# Patient Record
Sex: Male | Born: 1950 | Race: White | Hispanic: No | Marital: Married | Smoking: Former smoker
Health system: Southern US, Community
[De-identification: ages and names within clinical notes are randomized; demographics above are authoritative.]

## PROBLEM LIST (undated history)

## (undated) DIAGNOSIS — Z860101 Personal history of adenomatous and serrated colon polyps: Secondary | ICD-10-CM

## (undated) DIAGNOSIS — I10 Essential (primary) hypertension: Secondary | ICD-10-CM

## (undated) DIAGNOSIS — K219 Gastro-esophageal reflux disease without esophagitis: Secondary | ICD-10-CM

## (undated) DIAGNOSIS — K819 Cholecystitis, unspecified: Secondary | ICD-10-CM

## (undated) DIAGNOSIS — I2109 ST elevation (STEMI) myocardial infarction involving other coronary artery of anterior wall: Secondary | ICD-10-CM

## (undated) DIAGNOSIS — Z95810 Presence of automatic (implantable) cardiac defibrillator: Secondary | ICD-10-CM

## (undated) DIAGNOSIS — E785 Hyperlipidemia, unspecified: Secondary | ICD-10-CM

## (undated) DIAGNOSIS — I2510 Atherosclerotic heart disease of native coronary artery without angina pectoris: Secondary | ICD-10-CM

## (undated) DIAGNOSIS — E119 Type 2 diabetes mellitus without complications: Secondary | ICD-10-CM

## (undated) DIAGNOSIS — I509 Heart failure, unspecified: Secondary | ICD-10-CM

## (undated) DIAGNOSIS — Z86010 Personal history of colonic polyps: Secondary | ICD-10-CM

## (undated) HISTORY — PX: COLONOSCOPY: SHX174

## (undated) HISTORY — DX: Atherosclerotic heart disease of native coronary artery without angina pectoris: I25.10

## (undated) HISTORY — DX: Gastro-esophageal reflux disease without esophagitis: K21.9

## (undated) HISTORY — DX: Essential (primary) hypertension: I10

## (undated) HISTORY — DX: Personal history of adenomatous and serrated colon polyps: Z86.0101

## (undated) HISTORY — DX: Personal history of colonic polyps: Z86.010

## (undated) HISTORY — PX: COLONOSCOPY W/ BIOPSIES: SHX1374

## (undated) HISTORY — DX: Cholecystitis, unspecified: K81.9

---

## 1988-12-30 HISTORY — PX: CLOSED REDUCTION HAND FRACTURE: SHX973

## 2002-12-30 HISTORY — PX: CORONARY ANGIOPLASTY WITH STENT PLACEMENT: SHX49

## 2003-03-12 ENCOUNTER — Encounter: Payer: Self-pay | Admitting: Emergency Medicine

## 2003-03-12 ENCOUNTER — Inpatient Hospital Stay (HOSPITAL_COMMUNITY): Admission: EM | Admit: 2003-03-12 | Discharge: 2003-03-16 | Payer: Self-pay | Admitting: Emergency Medicine

## 2003-04-07 ENCOUNTER — Encounter: Payer: Self-pay | Admitting: Cardiology

## 2003-04-07 ENCOUNTER — Inpatient Hospital Stay (HOSPITAL_COMMUNITY): Admission: EM | Admit: 2003-04-07 | Discharge: 2003-04-11 | Payer: Self-pay

## 2004-11-26 ENCOUNTER — Ambulatory Visit: Payer: Self-pay | Admitting: Family Medicine

## 2004-11-30 ENCOUNTER — Ambulatory Visit: Payer: Self-pay | Admitting: Cardiology

## 2004-11-30 ENCOUNTER — Observation Stay (HOSPITAL_COMMUNITY): Admission: EM | Admit: 2004-11-30 | Discharge: 2004-12-01 | Payer: Self-pay | Admitting: Emergency Medicine

## 2004-12-03 ENCOUNTER — Ambulatory Visit: Payer: Self-pay | Admitting: *Deleted

## 2005-01-02 ENCOUNTER — Ambulatory Visit: Payer: Self-pay | Admitting: Family Medicine

## 2005-01-03 ENCOUNTER — Ambulatory Visit: Payer: Self-pay | Admitting: Licensed Clinical Social Worker

## 2005-01-09 ENCOUNTER — Ambulatory Visit: Payer: Self-pay | Admitting: Licensed Clinical Social Worker

## 2005-01-16 ENCOUNTER — Ambulatory Visit: Payer: Self-pay | Admitting: Licensed Clinical Social Worker

## 2005-01-23 ENCOUNTER — Ambulatory Visit: Payer: Self-pay | Admitting: Family Medicine

## 2005-01-30 ENCOUNTER — Ambulatory Visit: Payer: Self-pay | Admitting: Licensed Clinical Social Worker

## 2005-01-31 ENCOUNTER — Ambulatory Visit: Payer: Self-pay | Admitting: Family Medicine

## 2005-01-31 ENCOUNTER — Encounter: Payer: Self-pay | Admitting: Family Medicine

## 2005-03-06 ENCOUNTER — Ambulatory Visit: Payer: Self-pay | Admitting: *Deleted

## 2005-03-08 ENCOUNTER — Ambulatory Visit: Payer: Self-pay | Admitting: Licensed Clinical Social Worker

## 2005-03-22 ENCOUNTER — Ambulatory Visit: Payer: Self-pay | Admitting: Family Medicine

## 2005-04-04 ENCOUNTER — Ambulatory Visit: Payer: Self-pay | Admitting: Family Medicine

## 2005-04-22 ENCOUNTER — Ambulatory Visit: Payer: Self-pay | Admitting: Internal Medicine

## 2005-05-08 ENCOUNTER — Ambulatory Visit: Payer: Self-pay | Admitting: Family Medicine

## 2005-06-05 ENCOUNTER — Ambulatory Visit: Payer: Self-pay | Admitting: Internal Medicine

## 2005-06-18 ENCOUNTER — Ambulatory Visit: Payer: Self-pay | Admitting: Internal Medicine

## 2005-06-26 ENCOUNTER — Encounter (INDEPENDENT_AMBULATORY_CARE_PROVIDER_SITE_OTHER): Payer: Self-pay | Admitting: *Deleted

## 2005-06-26 ENCOUNTER — Ambulatory Visit: Payer: Self-pay | Admitting: Internal Medicine

## 2005-07-08 ENCOUNTER — Ambulatory Visit: Payer: Self-pay | Admitting: Family Medicine

## 2005-07-15 ENCOUNTER — Ambulatory Visit: Payer: Self-pay | Admitting: Internal Medicine

## 2005-08-29 ENCOUNTER — Ambulatory Visit: Payer: Self-pay | Admitting: Internal Medicine

## 2005-11-05 ENCOUNTER — Ambulatory Visit: Payer: Self-pay | Admitting: Cardiology

## 2005-11-29 ENCOUNTER — Ambulatory Visit: Payer: Self-pay | Admitting: Family Medicine

## 2005-12-04 ENCOUNTER — Ambulatory Visit: Payer: Self-pay | Admitting: Internal Medicine

## 2006-01-16 ENCOUNTER — Ambulatory Visit: Payer: Self-pay | Admitting: Family Medicine

## 2006-02-27 ENCOUNTER — Ambulatory Visit: Payer: Self-pay | Admitting: Family Medicine

## 2006-03-05 ENCOUNTER — Ambulatory Visit: Payer: Self-pay | Admitting: Internal Medicine

## 2006-03-26 ENCOUNTER — Ambulatory Visit: Payer: Self-pay | Admitting: Family Medicine

## 2006-07-26 ENCOUNTER — Observation Stay (HOSPITAL_COMMUNITY): Admission: EM | Admit: 2006-07-26 | Discharge: 2006-07-27 | Payer: Self-pay | Admitting: Emergency Medicine

## 2006-07-26 ENCOUNTER — Ambulatory Visit: Payer: Self-pay | Admitting: Internal Medicine

## 2006-07-29 ENCOUNTER — Ambulatory Visit: Payer: Self-pay | Admitting: Internal Medicine

## 2006-08-14 ENCOUNTER — Ambulatory Visit (HOSPITAL_COMMUNITY): Admission: RE | Admit: 2006-08-14 | Discharge: 2006-08-14 | Payer: Self-pay | Admitting: Cardiovascular Disease

## 2006-10-01 ENCOUNTER — Ambulatory Visit: Payer: Self-pay | Admitting: Internal Medicine

## 2007-01-26 ENCOUNTER — Ambulatory Visit: Payer: Self-pay | Admitting: Family Medicine

## 2007-01-26 LAB — CONVERTED CEMR LAB
Hgb A1c MFr Bld: 6.4 %
Hgb A1c MFr Bld: 6.4 % — ABNORMAL HIGH (ref 4.6–6.0)

## 2007-01-30 ENCOUNTER — Ambulatory Visit: Payer: Self-pay | Admitting: Internal Medicine

## 2007-02-04 ENCOUNTER — Ambulatory Visit: Payer: Self-pay

## 2007-03-31 ENCOUNTER — Ambulatory Visit: Payer: Self-pay | Admitting: Family Medicine

## 2007-04-03 ENCOUNTER — Ambulatory Visit: Payer: Self-pay | Admitting: Internal Medicine

## 2007-08-28 ENCOUNTER — Ambulatory Visit: Payer: Self-pay | Admitting: Internal Medicine

## 2007-09-07 ENCOUNTER — Ambulatory Visit: Payer: Self-pay | Admitting: Internal Medicine

## 2007-12-04 ENCOUNTER — Telehealth (INDEPENDENT_AMBULATORY_CARE_PROVIDER_SITE_OTHER): Payer: Self-pay | Admitting: *Deleted

## 2008-02-03 ENCOUNTER — Ambulatory Visit: Payer: Self-pay | Admitting: Family Medicine

## 2008-02-03 DIAGNOSIS — K5289 Other specified noninfective gastroenteritis and colitis: Secondary | ICD-10-CM

## 2008-02-09 ENCOUNTER — Encounter: Payer: Self-pay | Admitting: Family Medicine

## 2008-02-09 DIAGNOSIS — Z87891 Personal history of nicotine dependence: Secondary | ICD-10-CM

## 2008-02-09 DIAGNOSIS — I252 Old myocardial infarction: Secondary | ICD-10-CM

## 2008-02-09 DIAGNOSIS — K219 Gastro-esophageal reflux disease without esophagitis: Secondary | ICD-10-CM

## 2008-02-09 DIAGNOSIS — I2510 Atherosclerotic heart disease of native coronary artery without angina pectoris: Secondary | ICD-10-CM | POA: Insufficient documentation

## 2008-02-09 DIAGNOSIS — G4700 Insomnia, unspecified: Secondary | ICD-10-CM

## 2008-02-09 DIAGNOSIS — E785 Hyperlipidemia, unspecified: Secondary | ICD-10-CM

## 2008-02-10 ENCOUNTER — Ambulatory Visit: Payer: Self-pay | Admitting: Family Medicine

## 2008-02-11 DIAGNOSIS — E119 Type 2 diabetes mellitus without complications: Secondary | ICD-10-CM | POA: Insufficient documentation

## 2008-02-26 ENCOUNTER — Encounter: Admission: RE | Admit: 2008-02-26 | Discharge: 2008-02-26 | Payer: Self-pay | Admitting: Family Medicine

## 2008-02-26 ENCOUNTER — Encounter: Payer: Self-pay | Admitting: Family Medicine

## 2008-06-10 ENCOUNTER — Telehealth: Payer: Self-pay | Admitting: Family Medicine

## 2008-07-07 ENCOUNTER — Ambulatory Visit: Payer: Self-pay | Admitting: Family Medicine

## 2008-07-25 ENCOUNTER — Ambulatory Visit: Payer: Self-pay | Admitting: Family Medicine

## 2008-07-26 LAB — CONVERTED CEMR LAB
ALT: 21 units/L (ref 0–53)
AST: 24 units/L (ref 0–37)
Albumin: 3.9 g/dL (ref 3.5–5.2)
Alkaline Phosphatase: 47 units/L (ref 39–117)
BUN: 11 mg/dL (ref 6–23)
Bilirubin, Direct: 0.1 mg/dL (ref 0.0–0.3)
CO2: 31 meq/L (ref 19–32)
Calcium: 9.1 mg/dL (ref 8.4–10.5)
Chloride: 105 meq/L (ref 96–112)
Cholesterol: 100 mg/dL (ref 0–200)
Creatinine, Ser: 0.9 mg/dL (ref 0.4–1.5)
GFR calc Af Amer: 112 mL/min
GFR calc non Af Amer: 93 mL/min
Glucose, Bld: 145 mg/dL — ABNORMAL HIGH (ref 70–99)
HDL: 31.4 mg/dL — ABNORMAL LOW (ref >39.0)
Hgb A1c MFr Bld: 6.1 % — ABNORMAL HIGH (ref 4.6–6.0)
LDL Cholesterol: 60 mg/dL (ref 0–99)
Phosphorus: 2.2 mg/dL — ABNORMAL LOW (ref 2.3–4.6)
Potassium: 4.8 meq/L (ref 3.5–5.1)
Sodium: 141 meq/L (ref 135–145)
Total Bilirubin: 0.8 mg/dL (ref 0.3–1.2)
Total CHOL/HDL Ratio: 3.2
Total Protein: 6.6 g/dL (ref 6.0–8.3)
Triglycerides: 43 mg/dL (ref 0–149)
VLDL: 9 mg/dL (ref 0–40)

## 2008-08-11 ENCOUNTER — Ambulatory Visit: Payer: Self-pay | Admitting: Internal Medicine

## 2009-04-04 ENCOUNTER — Encounter: Payer: Self-pay | Admitting: Internal Medicine

## 2009-04-04 ENCOUNTER — Ambulatory Visit: Payer: Self-pay | Admitting: Family Medicine

## 2009-04-04 ENCOUNTER — Ambulatory Visit: Payer: Self-pay | Admitting: Internal Medicine

## 2009-04-04 LAB — CONVERTED CEMR LAB
ALT: 27 units/L (ref 0–53)
AST: 29 units/L (ref 0–37)
Alkaline Phosphatase: 52 units/L (ref 39–117)
BUN: 19 mg/dL (ref 6–23)
Basophils Absolute: 0.1 10*3/uL (ref 0.0–0.1)
Basophils Relative: 1.7 % (ref 0.0–3.0)
Calcium: 10.3 mg/dL (ref 8.4–10.5)
Chloride: 106 meq/L (ref 96–112)
Creatinine, Ser: 0.9 mg/dL (ref 0.4–1.5)
Eosinophils Relative: 2.4 % (ref 0.0–5.0)
GFR calc non Af Amer: 92.39 mL/min (ref >60)
Glucose, Bld: 108 mg/dL — ABNORMAL HIGH (ref 70–99)
HCT: 45.4 % (ref 39.0–52.0)
Hemoglobin: 15.7 g/dL (ref 13.0–17.0)
Lymphocytes Relative: 32.6 % (ref 12.0–46.0)
Lymphs Abs: 2.6 10*3/uL (ref 0.7–4.0)
MCHC: 34.6 g/dL (ref 30.0–36.0)
Monocytes Relative: 5.7 % (ref 3.0–12.0)
Neutro Abs: 4.5 10*3/uL (ref 1.4–7.7)
Neutrophils Relative %: 57.6 % (ref 43.0–77.0)
Platelets: 248 10*3/uL (ref 150.0–400.0)
Potassium: 4.1 meq/L (ref 3.5–5.1)
RBC: 4.72 M/uL (ref 4.22–5.81)
RDW: 12.3 % (ref 11.5–14.6)
Sodium: 144 meq/L (ref 135–145)
Total Bilirubin: 1.1 mg/dL (ref 0.3–1.2)
Total Protein: 6.7 g/dL (ref 6.0–8.3)
WBC: 7.9 10*3/uL (ref 4.5–10.5)

## 2009-04-07 LAB — CONVERTED CEMR LAB
Creatinine,U: 181.4 mg/dL
Hgb A1c MFr Bld: 6.1 % (ref 4.6–6.5)
Microalb Creat Ratio: 2.8 mg/g (ref 0.0–30.0)
Microalb, Ur: 0.5 mg/dL (ref 0.0–1.9)

## 2009-05-20 ENCOUNTER — Encounter: Payer: Self-pay | Admitting: Family Medicine

## 2009-05-23 ENCOUNTER — Encounter: Payer: Self-pay | Admitting: Family Medicine

## 2009-05-28 ENCOUNTER — Emergency Department (HOSPITAL_COMMUNITY): Admission: EM | Admit: 2009-05-28 | Discharge: 2009-05-28 | Payer: Self-pay | Admitting: Family Medicine

## 2009-05-31 LAB — HM DIABETES EYE EXAM: HM Diabetic Eye Exam: NORMAL

## 2009-09-01 ENCOUNTER — Telehealth (INDEPENDENT_AMBULATORY_CARE_PROVIDER_SITE_OTHER): Payer: Self-pay | Admitting: *Deleted

## 2009-09-08 ENCOUNTER — Telehealth: Payer: Self-pay | Admitting: Family Medicine

## 2009-09-11 ENCOUNTER — Encounter: Payer: Self-pay | Admitting: Family Medicine

## 2009-09-28 ENCOUNTER — Encounter: Payer: Self-pay | Admitting: Family Medicine

## 2009-09-29 ENCOUNTER — Ambulatory Visit: Payer: Self-pay | Admitting: Family Medicine

## 2009-09-29 DIAGNOSIS — R5383 Other fatigue: Secondary | ICD-10-CM

## 2009-09-29 DIAGNOSIS — R5381 Other malaise: Secondary | ICD-10-CM

## 2009-09-29 DIAGNOSIS — R079 Chest pain, unspecified: Secondary | ICD-10-CM

## 2009-09-29 LAB — CONVERTED CEMR LAB
Bilirubin Urine: NEGATIVE
Blood in Urine, dipstick: NEGATIVE
Ketones, urine, test strip: NEGATIVE
Nitrite: NEGATIVE
Protein, U semiquant: NEGATIVE
Urobilinogen, UA: 0.2
WBC Urine, dipstick: NEGATIVE
pH: 6

## 2009-10-06 ENCOUNTER — Encounter (INDEPENDENT_AMBULATORY_CARE_PROVIDER_SITE_OTHER): Payer: Self-pay | Admitting: *Deleted

## 2009-10-09 LAB — CONVERTED CEMR LAB
ALT: 22 units/L (ref 0–53)
AST: 25 units/L (ref 0–37)
Albumin: 4.2 g/dL (ref 3.5–5.2)
BUN: 18 mg/dL (ref 6–23)
Basophils Absolute: 0 10*3/uL (ref 0.0–0.1)
Basophils Relative: 0.2 % (ref 0.0–3.0)
Bilirubin, Direct: 0.1 mg/dL (ref 0.0–0.3)
CO2: 31 meq/L (ref 19–32)
Calcium: 9.3 mg/dL (ref 8.4–10.5)
Chloride: 104 meq/L (ref 96–112)
Creatinine, Ser: 1 mg/dL (ref 0.4–1.5)
Eosinophils Absolute: 0.1 10*3/uL (ref 0.0–0.7)
Eosinophils Relative: 1.4 % (ref 0.0–5.0)
Folate: 14.5 ng/mL
GFR calc non Af Amer: 81.67 mL/min (ref >60)
Glucose, Bld: 109 mg/dL — ABNORMAL HIGH (ref 70–99)
HCT: 46.5 % (ref 39.0–52.0)
Lymphocytes Relative: 27.4 % (ref 12.0–46.0)
Lymphs Abs: 1.7 10*3/uL (ref 0.7–4.0)
MCHC: 33.8 g/dL (ref 30.0–36.0)
MCV: 97.8 fL (ref 78.0–100.0)
Monocytes Absolute: 0.6 10*3/uL (ref 0.1–1.0)
Monocytes Relative: 9.2 % (ref 3.0–12.0)
Neutro Abs: 3.9 10*3/uL (ref 1.4–7.7)
Neutrophils Relative %: 61.8 % (ref 43.0–77.0)
Platelets: 234 10*3/uL (ref 150.0–400.0)
Potassium: 4.2 meq/L (ref 3.5–5.1)
RBC: 4.75 M/uL (ref 4.22–5.81)
RDW: 12.8 % (ref 11.5–14.6)
Sodium: 139 meq/L (ref 135–145)
TSH: 0.82 microintl units/mL (ref 0.35–5.50)
Total Bilirubin: 0.9 mg/dL (ref 0.3–1.2)
Total Protein: 7 g/dL (ref 6.0–8.3)
Vitamin B-12: 267 pg/mL (ref 211–911)
WBC: 6.3 10*3/uL (ref 4.5–10.5)

## 2009-11-07 ENCOUNTER — Ambulatory Visit: Payer: Self-pay | Admitting: Family Medicine

## 2009-11-07 LAB — HM DIABETES FOOT EXAM

## 2009-11-09 LAB — CONVERTED CEMR LAB: Vitamin B-12: 691 pg/mL (ref 211–911)

## 2010-01-23 ENCOUNTER — Ambulatory Visit: Payer: Self-pay | Admitting: Internal Medicine

## 2010-02-07 ENCOUNTER — Encounter: Payer: Self-pay | Admitting: Family Medicine

## 2010-05-16 ENCOUNTER — Telehealth: Payer: Self-pay | Admitting: Family Medicine

## 2010-05-17 ENCOUNTER — Encounter (INDEPENDENT_AMBULATORY_CARE_PROVIDER_SITE_OTHER): Payer: Self-pay | Admitting: *Deleted

## 2010-05-22 ENCOUNTER — Encounter: Payer: Self-pay | Admitting: Family Medicine

## 2010-05-22 ENCOUNTER — Encounter: Payer: Self-pay | Admitting: Internal Medicine

## 2010-06-12 ENCOUNTER — Ambulatory Visit: Payer: Self-pay | Admitting: Internal Medicine

## 2010-06-28 ENCOUNTER — Telehealth: Payer: Self-pay | Admitting: Internal Medicine

## 2010-09-10 ENCOUNTER — Encounter (INDEPENDENT_AMBULATORY_CARE_PROVIDER_SITE_OTHER): Payer: Self-pay | Admitting: *Deleted

## 2010-09-10 ENCOUNTER — Ambulatory Visit: Payer: Self-pay | Admitting: Internal Medicine

## 2010-09-17 ENCOUNTER — Telehealth (INDEPENDENT_AMBULATORY_CARE_PROVIDER_SITE_OTHER): Payer: Self-pay | Admitting: *Deleted

## 2010-09-19 ENCOUNTER — Ambulatory Visit: Payer: Self-pay | Admitting: Internal Medicine

## 2010-09-19 LAB — HM COLONOSCOPY

## 2010-09-25 ENCOUNTER — Encounter: Payer: Self-pay | Admitting: Internal Medicine

## 2010-10-02 ENCOUNTER — Ambulatory Visit: Payer: Self-pay | Admitting: Family Medicine

## 2010-10-02 ENCOUNTER — Telehealth (INDEPENDENT_AMBULATORY_CARE_PROVIDER_SITE_OTHER): Payer: Self-pay | Admitting: *Deleted

## 2010-10-05 LAB — CONVERTED CEMR LAB
ALT: 28 units/L (ref 0–53)
AST: 26 units/L (ref 0–37)
Albumin: 4.3 g/dL (ref 3.5–5.2)
Alkaline Phosphatase: 60 units/L (ref 39–117)
BUN: 21 mg/dL (ref 6–23)
Bilirubin, Direct: 0.2 mg/dL (ref 0.0–0.3)
CO2: 30 meq/L (ref 19–32)
Calcium: 9.9 mg/dL (ref 8.4–10.5)
Chloride: 103 meq/L (ref 96–112)
Cholesterol: 130 mg/dL (ref 0–200)
Creatinine, Ser: 1 mg/dL (ref 0.4–1.5)
GFR calc non Af Amer: 84.29 mL/min (ref >60)
Glucose, Bld: 120 mg/dL — ABNORMAL HIGH (ref 70–99)
HDL: 31.9 mg/dL — ABNORMAL LOW (ref >39.00)
Hgb A1c MFr Bld: 6.6 % — ABNORMAL HIGH (ref 4.6–6.5)
LDL Cholesterol: 80 mg/dL (ref 0–99)
Phosphorus: 3.8 mg/dL (ref 2.3–4.6)
Potassium: 4.8 meq/L (ref 3.5–5.1)
Sodium: 141 meq/L (ref 135–145)
TSH: 0.61 microintl units/mL (ref 0.35–5.50)
Total Bilirubin: 1 mg/dL (ref 0.3–1.2)
Total CHOL/HDL Ratio: 4
Total Protein: 7.2 g/dL (ref 6.0–8.3)
Triglycerides: 93 mg/dL (ref 0.0–149.0)
VLDL: 18.6 mg/dL (ref 0.0–40.0)

## 2010-12-30 HISTORY — PX: CARDIAC CATHETERIZATION: SHX172

## 2011-01-29 NOTE — Letter (Signed)
Summary: [facility]  [facility]   Imported By: Marylou Mccoy 07/24/2010 14:52:02  _____________________________________________________________________  External Attachment:    Type:   Image     Comment:   External Document

## 2011-01-29 NOTE — Letter (Signed)
Summary: Diabetic Instructions  [facility]  [address] [place], [place] [address]   Phone: [phone]  Fax: [phone]    YORDY MATTON [birth year] MRN: [id number]   _x_   ORAL DIABETIC MEDICATION INSTRUCTIONS  The day before your procedure:   Take your diabetic pill as you do normally  The day of your procedure:   Do not take your diabetic pill    We will check your blood sugar levels during the admission process and again in Recovery before discharging you home

## 2011-01-29 NOTE — Progress Notes (Signed)
Summary: Resend Moviprep   ---- Converted from flag ---- ---- 09/17/2010 8:38 AM, Karna Christmas wrote: [facility] [place] Change pharmacy.  Procedure is sch'd for Wed. ------------------------------  Phone Note Call from Patient   Summary of Call: [facility] Initial call taken by: Francee Piccolo CMA Duncan Dull),  September 17, 2010 8:44 AM    Prescriptions: MOVIPREP 100 GM  SOLR (PEG-KCL-NACL-NASULF-NA ASC-C) As per prep instructions.  #1 x 0   Entered by:   Francee Piccolo CMA (AAMA)   Authorized by:   Iva Boop MD, [facility]   Signed by:   Francee Piccolo CMA (AAMA) on 09/17/2010   Method used:   [facility]ronically [facility]        [facility]  [place] (retail)       [address]       [place], [place]  [address]       Ph: [phone] or [phone]       Fax: [phone]   RxID:   [id number]

## 2011-01-29 NOTE — Letter (Signed)
Summary: Previsit letter  [facility]  [address] [place], [place] [address]   Phone: [phone]  Fax: [phone]       05/17/2010 MRN: [id number]  Harry Payne [address] [place], [place]  [address]  Dear [name],  Welcome to the Gastroenterology Division at [facility].    You are scheduled to see a nurse for your pre-procedure visit on 06-12-10 at 9:00a.m. on the 3rd floor at [facility], [address]  We ask that you try to arrive at our office 15 minutes prior to your appointment time to allow for check-in.  Your nurse visit will consist of discussing your medical and surgical history, your immediate family medical history, and your medications.    Please bring a complete list of all your medications or, if you prefer, bring the medication bottles and we will list them.  We will need to be aware of both prescribed and over the counter drugs.  We will need to know exact dosage information as well.  If you are on blood thinners (Coumadin, Plavix, Aggrenox, Ticlid, etc.) please call our office today/prior to your appointment, as we need to consult with your physician about holding your medication.   Please be prepared to read and sign documents such as consent forms, a financial agreement, and acknowledgement forms.  If necessary, and with your consent, a friend or relative is welcome to sit-in on the nurse visit with you.  Please bring your insurance card so that we may make a copy of it.  If your insurance requires a referral to see a specialist, please bring your referral form from your primary care physician.  No co-pay is required for this nurse visit.     If you cannot keep your appointment, please call [phone] to cancel or reschedule prior to your appointment date.  This allows [place] the opportunity to schedule an appointment for another patient in need of care.    Thank you for choosing [facility] for your medical needs.   We appreciate the opportunity to care for you.  Please visit [place] at our website  to learn more about our practice.                     Sincerely.                                                                                                                   The Gastroenterology Division

## 2011-01-29 NOTE — Assessment & Plan Note (Signed)
Summary: SCREEN FOR COLON/ON BLD THNRS/YF    History of Present Illness Visit Type: Initial Consult Primary GI MD: Stan Head MD [facility] Primary Provider: Loreen Freud, DO Requesting Provider: Loreen Freud, DO Chief Complaint: Colon on Plavix (stents placed 6-7 years ago) Cardiologist-Dr Roe Rutherford History of Present Illness:   60 yo wm with hx of colon polyps and due for surveillance colonoscopy. He takes Plavix for CAD. No chest pain/cardiac symptoms.   GI Review of Systems      Denies abdominal pain, acid reflux, belching, bloating, chest pain, dysphagia with liquids, dysphagia with solids, heartburn, loss of appetite, nausea, vomiting, vomiting blood, weight loss, and  weight gain.        Denies anal fissure, black tarry stools, change in bowel habit, constipation, diarrhea, diverticulosis, fecal incontinence, heme positive stool, hemorrhoids, irritable bowel syndrome, jaundice, light color stool, liver problems, rectal bleeding, and  rectal pain. Clinical Reports Reviewed:  Colonoscopy:  06/26/2005:  Adenomatous Polyp  06/26/2005:  Results: Hemorrhoids.     Pathology:  Adenomatous polyp.        Pathology:  Hyperplastic polyp.     Location:  [facility].   06/26/2005:  Results: Hemorrhoids.  Pathology:  Adenomatous polyp.   Pathology:  Hyperplastic polyp.   Comments: 1) 4 diminutive polyps removed 2) Small external hemorrhoids  ***MICROSCOPIC EXAMINATION AND DIAGNOSIS***    1. COLON, POLYP(S): TWO PORTIONS OF ADENOMATOUS POLYP AND ONE  HYPERPLASTIC POLYP. NO HIGH GRADE DYSPLASIA OR INVASIVE MALIGNANCY IDENTIFIED (BIOPSIES, CECUM AND DESCENDING).    2. COLON, POLYP(S): HYPERPLASTIC POLYP AND POLYPOID FRAGMENT OF COLONIC MUCOSA. NO ADENOMATOUS CHANGE OR MALIGNANCY  IDENTIFIED (BIOPSIES, SIGMOID).   Preventive Screening-Counseling & Management  Caffeine-Diet-Exercise     Does Patient Exercise: yes      Drug Use:  no.      Current Medications  (verified): 1)  Protonix 40 Mg Tbec (Pantoprazole Sodium) .... Take 1 Tablet By Mouth Once A Day 2)  Plavix 75 Mg Tabs (Clopidogrel Bisulfate) .... Take 1 Tablet By Mouth Once A Day 3)  Onetouch Ultra Test   Strp (Glucose Blood) .... Ise To Test Daily and As Directed 4)  Coreg 25 Mg  Tabs (Carvedilol) .Marland Kitchen.. 1 By Mouth Two Times A Day 5)  Ramipril 5 Mg Caps (Ramipril) .... Take One Capsule By Mouth Daily 6)  Niaspan 1000 Mg  Tbcr (Niacin (Antihyperlipidemic)) .... 2 By Mouth Once Daily 7)  Adult Aspirin Low Strength 81 Mg  Tbdp (Aspirin) .... 2 By Mouth Once Daily 8)  Metformin Hcl 500 Mg  Tabs (Metformin Hcl) .Marland Kitchen.. 1 By Mouth Two Times A Day 9)  Nitroglycerin 0.4 Mg Subl (Nitroglycerin) .... One Tablet Under Tongue Every 5 Minutes As Needed For Chest Pain---May Repeat Times Three 10)  Vitamin B-12 1000 Mcg Tabs (Cyanocobalamin) .... Take 1 Tablet By Mouth Once A Day 11)  Crestor 40 Mg Tabs (Rosuvastatin Calcium) .... Take One Tablet By Mouth Daily.  Allergies (verified): 1)  Ace Inhibitors  Past History:  Past Medical History: Reviewed history from 07/17/2010 and no changes required. Anxiety Coronary artery disease    --s/p Anterior MI 2004. s/p stenting of LAD and LCx    --EF 48% by MRI   --Myoview 2009 EF 43%. anterior apical scar. no ischemia GERD Hyperlipidemia in AIM-HIGH trial DM 2 hypotension Adenomatous Colon Polyps Hemorrhoids  Past Surgical History: Reviewed history from 02/09/2008 and no changes required. Left hand fracture (1990) Treadmill stress test (2001) Cardiolite- WNL, HTNsive response (05/1997) Admit  CAD- stenting, cath (02/2003) Admit CP- not cardiac (03/2003) Cath- PTCA 2 vessel disease (02/2003) Stress cardiolite EF 33%, old infarct (11/2003) Admit CP (11/2004) Colonoscopy- polyps, hem (05/2005) Hosp- CP, r/o MI (07/2006) Nuclear cardiac study- anteroapical scar (01/2007)  Family History: Father: died age 87- prostate cancer Mother: died age  62 Siblings: 3 brothers- 2 with DM, 7 sisters- ok Family History of Colon Polyps: Brother x 2 No FH of Colon Cancer: Family History of Heart Disease: Brother  Social History: works for [employer]- in [employer]  Former Smoker-stopped 7 years ago Marital Status: Married Children: 3 Alcohol Use - no Illicit Drug Use - no Patient gets regular exercise. Drug Use:  no Does Patient Exercise:  yes  Review of Systems       The patient complains of sleeping problems.         All other ROS negative except as per HPI.'  Vital Signs:  Patient profile:   60 year old male Height:      71 inches Weight:      229 pounds BMI:     32.05 BSA:     2.24 Pulse rate:   64 / minute Pulse rhythm:   regular BP sitting:   110 / 62  (left arm)  Vitals Entered By: Lamona Curl CMA Duncan Dull) (September 10, 2010 10:07 AM)  Physical Exam  General:  overweight to obese NAD Lungs:  Clear throughout to auscultation. Heart:  Regular rate and rhythm; no murmurs, rubs,  or bruits.   Impression & Recommendations:  Problem # 1:  SCREENING, COLON CANCER (ICD-V76.51) Assessment Unchanged Risks, benefits,and indications of endoscopic procedure(s) were reviewed with the patient and all questions answered.  Orders: Colonoscopy (Colon)  Problem # 2:  COLONIC POLYPS, ADENOMATOUS, HX OF (ICD-V12.72) Assessment: Unchanged 2 adenomas in 2006 time for surveillance/screening  Risks, benefits,and indications of endoscopic procedure(s) were reviewed with the patient and all questions answered.  Orders: Colonoscopy (Colon)  Problem # 3:  CORONARY ARTERY DISEASE (ICD-414.00) Assessment: Comment Only ask Dr. Gala Romney re: holdng Plavix 5-7 days  Patient Instructions: 1)  Please pick up your medications at your pharmacy.  2)  We will see you at your procedure on 09/19/10. 3)  We will contact your cardiologist regarding your Plavix.  You will be contacted by our office prior to your procedure for  directions on holding your Plavix.  If you do not hear from our office 1 week prior to your scheduled procedure, please call [phone] to discuss.  4)  [facility] Patient Information Guide given to patient.  5)  Colonoscopy and Flexible Sigmoidoscopy brochure given.  6)  The medication list was reviewed and reconciled.  All changed / newly prescribed medications were explained.  A complete medication list was provided to the patient / caregiver. Prescriptions: MOVIPREP 100 GM  SOLR (PEG-KCL-NACL-NASULF-NA ASC-C) As per prep instructions.  #1 x 0   Entered by:   Francee Piccolo CMA (AAMA)   Authorized by:   Iva Boop MD, [facility]   Signed by:   Francee Piccolo CMA (AAMA) on 09/10/2010   Method used:   Electronically to        [facility] [id number] (retail)       [address] [place], [place]  [address]       Ph: [phone]       Fax: [phone]   RxID:   [id number]

## 2011-01-29 NOTE — Medication Information (Signed)
Summary: Possible Interaction Between Pantoprazole & Plavix/[facility]   Possible Interaction Between Pantoprazole & Plavix/[facility]   Imported By: Lanelle Bal 02/14/2010 12:45:17  _____________________________________________________________________  External Attachment:    Type:   Image     Comment:   External Document

## 2011-01-29 NOTE — Assessment & Plan Note (Signed)
Summary: FOLLOW UP   Vital Signs:  Patient profile:   60 year old male Height:      71 inches Weight:      228.50 pounds BMI:     31.98 Temp:     98.3 degrees F oral Pulse rate:   72 / minute Pulse rhythm:   regular BP sitting:   102 / 68  (left arm) Cuff size:   regular  Vitals Entered By: Lewanda Rife LPN (October 02, 2010 8:29 AM) CC: follow-up visit   History of Present Illness: here for f/u of HTN and lipids and DM  feeling good   wt is down 1 lb  recent colonosc with adenomatous polyp to re check in 5 y it all went well   HTN well controlled with 102/68 today   lipids- out of aim high study now on crestor and niaspan due for a check of that    overdue for AIc - last 6.4 diet is good  is exercising a bit less - really busy -- but making the effort  involved in communitiy   will get flu shot this week   last eye exam june - was normal and goes to in [place]   declines pneumovax        Allergies: 1)  Ace Inhibitors  Past History:  Past Medical History: Last updated: 07/17/2010 Anxiety Coronary artery disease    --s/p Anterior MI 2004. s/p stenting of LAD and LCx    --EF 48% by MRI   --Myoview 2009 EF 43%. anterior apical scar. no ischemia GERD Hyperlipidemia in AIM-HIGH trial DM 2 hypotension Adenomatous Colon Polyps Hemorrhoids  Past Surgical History: Last updated: 02/09/2008 Left hand fracture (1990) Treadmill stress test (2001) Cardiolite- WNL, HTNsive response (05/1997) Admit CAD- stenting, cath (02/2003) Admit CP- not cardiac (03/2003) Cath- PTCA 2 vessel disease (02/2003) Stress cardiolite EF 33%, old infarct (11/2003) Admit CP (11/2004) Colonoscopy- polyps, hem (05/2005) Hosp- CP, r/o MI (07/2006) Nuclear cardiac study- anteroapical scar (01/2007)  Family History: Last updated: 09-26-10 Father: died age 40- prostate cancer Mother: died age 33 Siblings: 3 brothers- 2 with DM, 7 sisters- ok Family History of Colon  Polyps: Brother x 2 No FH of Colon Cancer: Family History of Heart Disease: Brother  Social History: Last updated: 26-Sep-2010 works for [employer]- in [employer]  Former Smoker-stopped 7 years ago Marital Status: Married Children: 3 Alcohol Use - no Illicit Drug Use - no Patient gets regular exercise.  Risk Factors: Exercise: yes (Sep 26, 2010)  Risk Factors: Smoking Status: quit (08/28/2007)  Review of Systems General:  Denies fatigue, fever, and loss of appetite. Eyes:  Denies blurring and eye irritation. CV:  Denies chest pain or discomfort, lightheadness, and palpitations. Resp:  Denies cough, shortness of breath, and wheezing. GI:  Denies abdominal pain and change in bowel habits. MS:  Denies joint pain, joint redness, and joint swelling. Derm:  Denies itching, lesion(s), poor wound healing, and rash. Neuro:  Denies numbness and tingling. Psych:  Denies anxiety and depression. Endo:  Denies cold intolerance, excessive thirst, excessive urination, and heat intolerance. Heme:  Denies abnormal bruising and bleeding.  Physical Exam  General:  Well-developed,well-nourished,in no acute distress; alert,appropriate and cooperative throughout examination Head:  normocephalic, atraumatic, and no abnormalities observed.   Eyes:  vision grossly intact, pupils equal, pupils round, and pupils reactive to light.  no conjunctival pallor, injection or icterus  Ears:  R ear normal and L ear normal.   Nose:  no nasal discharge.  Mouth:  pharynx pink and moist.   Neck:  supple with full rom and no masses or thyromegally, no JVD or carotid bruit  Chest Wall:  No deformities, masses, tenderness or gynecomastia noted. Lungs:  Normal respiratory effort, chest expands symmetrically. Lungs are clear to auscultation, no crackles or wheezes. Heart:  Normal rate and regular rhythm. S1 and S2 normal without gallop, murmur, click, rub or other extra sounds. Abdomen:  Bowel sounds  positive,abdomen soft and non-tender without masses, organomegaly or hernias noted. no renal bruits  Msk:  No deformity or scoliosis noted of thoracic or lumbar spine.   Pulses:  R and L carotid,radial,femoral,dorsalis pedis and posterior tibial pulses are full and equal bilaterally Extremities:  No clubbing, cyanosis, edema, or deformity noted with normal full range of motion of all joints.   Neurologic:  sensation intact to light touch, gait normal, and DTRs symmetrical and normal.   Skin:  Intact without suspicious lesions or rashes Cervical Nodes:  No lymphadenopathy noted Inguinal Nodes:  No significant adenopathy Psych:  normal affect, talkative and pleasant    Impression & Recommendations:  Problem # 1:  COLONIC POLYPS, ADENOMATOUS, HX OF (ICD-V12.72) Assessment Unchanged disc recent colonosc and reason for 5 year f/u  no symptoms  Problem # 2:  DIABETES-TYPE 2 (ICD-250.00) Assessment: Unchanged  expect good control with metformin and diet  opthy up to date / good foot care  on ace  lab today f/u 6 mo  His updated medication list for this problem includes:    Ramipril 5 Mg Caps (Ramipril) .Marland Kitchen... Take one capsule by mouth daily    Adult Aspirin Low Strength 81 Mg Tbdp (Aspirin) .Marland Kitchen... 2 by mouth once daily    Metformin Hcl 500 Mg Tabs (Metformin hcl) .Marland Kitchen... 1 by mouth two times a day  Orders: Venipuncture (75643) TLB-Lipid Panel (80061-LIPID) TLB-Renal Function Panel (80069-RENAL) TLB-Hepatic/Liver Function Pnl (80076-HEPATIC) TLB-TSH (Thyroid Stimulating Hormone) (84443-TSH) TLB-A1C / Hgb A1C (Glycohemoglobin) (83036-A1C) Prescription Created Electronically [phone])  Labs Reviewed: Creat: 1.0 (09/29/2009)     Last Eye Exam: normal (05/31/2009) Reviewed HgBA1c results: 6.4 (09/29/2009)  6.1 (04/04/2009)  Problem # 3:  HYPERLIPIDEMIA (ICD-272.4) Assessment: Improved  now no longer in aim high trial  on crestor and niaspan lab today and update rev low sat fat diet   His updated medication list for this problem includes:    Niaspan 1000 Mg Tbcr (Niacin (antihyperlipidemic)) .Marland Kitchen... 2 by mouth once daily    Crestor 40 Mg Tabs (Rosuvastatin calcium) .Marland Kitchen... Take one tablet by mouth daily.  Orders: Venipuncture (88416) TLB-Lipid Panel (80061-LIPID) TLB-Renal Function Panel (80069-RENAL) TLB-Hepatic/Liver Function Pnl (80076-HEPATIC) TLB-TSH (Thyroid Stimulating Hormone) (84443-TSH) TLB-A1C / Hgb A1C (Glycohemoglobin) (83036-A1C) Prescription Created Electronically [id number])  Labs Reviewed: SGOT: 25 (09/29/2009)   SGPT: 22 (09/29/2009)   HDL:31.4 (07/25/2008)  LDL:60 (07/25/2008)  Chol:100 (07/25/2008)  Trig:43 (07/25/2008)  Complete Medication List: 1)  Protonix 40 Mg Tbec (Pantoprazole sodium) .... Take 1 tablet by mouth once a day 2)  Plavix 75 Mg Tabs (Clopidogrel bisulfate) .... Take 1 tablet by mouth once a day 3)  Onetouch Ultra Test Strp (Glucose blood) .... Ise to test daily and as directed 4)  Coreg 25 Mg Tabs (Carvedilol) .Marland Kitchen.. 1 by mouth two times a day 5)  Ramipril 5 Mg Caps (Ramipril) .... Take one capsule by mouth daily 6)  Niaspan 1000 Mg Tbcr (Niacin (antihyperlipidemic)) .... 2 by mouth once daily 7)  Adult Aspirin Low Strength 81 Mg Tbdp (Aspirin) .... 2 by mouth  once daily 8)  Metformin Hcl 500 Mg Tabs (Metformin hcl) .Marland Kitchen.. 1 by mouth two times a day 9)  Nitroglycerin 0.4 Mg Subl (Nitroglycerin) .... One tablet under tongue every 5 minutes as needed for chest pain---may repeat times three 10)  Vitamin B-12 1000 Mcg Tabs (Cyanocobalamin) .... Take 1 tablet by mouth once a day 11)  Crestor 40 Mg Tabs (Rosuvastatin calcium) .... Take one tablet by mouth daily.  Patient Instructions: 1)  get flu shot at work 2)  keep up good diet  3)  add exercise as you can  4)  no change in medicine  5)  follow up in about 6 months  6)  px were sent to your pharmacy Prescriptions: CRESTOR 40 MG TABS (ROSUVASTATIN CALCIUM) take one tablet by mouth  daily.  #30 x 11   Entered and Authorized by:   Judith Part MD   Signed by:   Judith Part MD on 10/02/2010   Method used:   [facility]        [facility]  [place] [id number]       [address]       [place], [place]  [address]       Ph: [phone] or [phone]       Fax: [phone]   RxID:   [id number] NITROGLYCERIN 0.4 MG SUBL (NITROGLYCERIN) One tablet under tongue every 5 minutes as needed for chest pain---may repeat times three  #15 x 1   Entered and Authorized by:   Judith Part MD   Signed by:   Judith Part MD on 10/02/2010   Method used:   [facility]ronically to        [facility]  [place] (retail)       [address]       [place], [place]  [address]       Ph: [phone] or [phone]       Fax: [phone]   RxID:   [id number] METFORMIN HCL 500 MG  TABS (METFORMIN HCL) 1 by mouth two times a day  #60 x 11   Entered and Authorized by:   Judith Part MD   Signed by:   Judith Part MD on 10/02/2010   Method used:   [facility]        [facility]  [place] (retail)       [address]       [place], [place]  [address]       Ph: [phone] or [phone]       Fax: [phone]   RxID:   [id number] NIASPAN 1000 MG  TBCR (NIACIN (ANTIHYPERLIPIDEMIC)) 2 by mouth once daily  #60 x 11   Entered and Authorized by:   Judith Part MD   Signed by:   Judith Part MD on 10/02/2010   Method used:   [facility]ically [facility]        [facility]  [place] (retail)       [address]       [place], [place]  [address]       Ph: [phone] or [phone]       Fax: [phone]   RxID:   [id number] RAMIPRIL 5 MG CAPS (RAMIPRIL) Take one capsule by mouth daily  #30 x 11   Entered and Authorized by:   Judith Part MD   Signed by:   Judith Part MD on 10/02/2010   Method used:   [facility]  [facility]  [place] [address] (retail)       [address]       [place], [place]   [address]       Ph: [phone] or [phone]       Fax: [phone]   RxID:   [id number]   Current Allergies (reviewed today): ACE INHIBITORS

## 2011-01-29 NOTE — Letter (Signed)
Summary: Patient Notice- Polyp Results  [facility]  [address] [place], [place] [address]   Phone: [phone]  Fax: [phone]        September 25, 2010 MRN: [id number]    Harry Payne [address] [place], [place]  [address]    Dear Mr. Dix,  The polyp removed from your colon was adenomatous. This means that it was pre-cancerous or that  it had the potential to change into cancer over time. the polyp removed 5 years ago was alos adenomatous. they were both very small polyps.  I recommend that you have a repeat colonoscopy in 5 years to determine if you have developed any new polyps over time. If you develop any new rectal bleeding, abdominal pain or significant bowel habit changes, please contact us before then.  Please call us if you are having persistent problems or have questions about your condition that have not been fully answered at this time.   Sincerely,  Iva Boop MD, [facility]  This letter has been electronically signed by your physician.  Appended Document: Patient Notice- Polyp Results letter mailed

## 2011-01-29 NOTE — Procedures (Signed)
Summary: Colonoscopy: Adenomatous Polyp, Hemorrhoids   Colonoscopy  Procedure date:  06/26/2005  Findings:      Results: Hemorrhoids.     Pathology:  Adenomatous polyp.        Pathology:  Hyperplastic polyp.     Location:  [facility].    Procedures Next Due Date:    Colonoscopy: 06/2010  Patient Name: Zamar, Odwyer MRN:  Procedure Procedures: Colonoscopy CPT: [address].    with biopsy. CPT: Q5068410.    with polypectomy. CPT: A3573898.  Personnel: Endoscopist: Iva Boop, MD, [facility].  Referred By: Roxy Manns, MD.  Exam Location: Exam performed in Outpatient Clinic. Outpatient  Patient Consent: Procedure, Alternatives, Risks and Benefits discussed, consent obtained, from patient. Consent was obtained by the RN.  Indications  Increased Risk Screening: Family History of Polyps.  Comments: Two brothers had colon polyps in their 96's. History  Current Medications: Patient is not currently taking Coumadin.  Pre-Exam Physical: Performed Jun 26, 2005. Cardio-pulmonary exam, Rectal exam, HEENT exam , Abdominal exam, Mental status exam WNL.  Exam Exam: Extent of exam reached: Cecum, extent intended: Cecum.  The cecum was identified by appendiceal orifice and IC valve. Patient position: left side to back. Colon retroflexion performed. Images taken. ASA Classification: III. Tolerance: good.  Monitoring: Pulse and BP monitoring, Oximetry used. Supplemental O2 given.  Colon Prep Used MiraLax for colon prep. Prep results: excellent.  Sedation Meds: Patient assessed and found to be appropriate for moderate (conscious) sedation. Fentanyl 50 mcg. given IV. Versed 4 mg. given IV.  Findings POLYP: Descending Colon, Maximum size: 4 mm. sessile polyp. Procedure:  biopsy without cautery, removed, retrieved, Polyp sent to pathology. 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  POLYP: Cecum, Maximum size: 4 mm. sessile polyp. Procedure:  biopsy without cautery,  removed, retrieved, sent to pathology. 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  POLYP: Sigmoid Colon, Maximum size: 4 mm. sessile polyp. Procedure:  biopsy without cautery, removed, retrieved, sent to pathology. 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  POLYP: Sigmoid Colon, Maximum size: 5 mm. sessile polyp. Procedure:  snare without cautery, removed, retrieved, sent to pathology. 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  HEMORRHOIDS: External. Size: Grade I. ICD9: Hemorrhoids, External: 455.3.    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  455.3: Hemorrhoids, External.   Comments: 1) 4 diminutive polyps removed 2) Small external hemorrhoids Events  Unplanned Interventions: No intervention was required.  Plans  Post Exam Instructions: Restart medications: today (Plavix).  Patient Education: Patient given standard instructions for: Polyps. Hemorrhoids.  Disposition: After procedure patient sent to recovery. After recovery patient sent home.  Scheduling/Referral: Await pathology to schedule patient. Colonoscopy, to Iva Boop, MD, [facility], 3-5 yrs,  Primary Care Provider, to Roxy Manns, MD, as planned,  Path Letter, to The Patient,   CC:   Roxy Manns, MD  This report was created from the original endoscopy report, which was reviewed and signed by the above listed endoscopist.

## 2011-01-29 NOTE — Progress Notes (Signed)
Summary: wants referral for colonoscopy  Phone Note Call from Patient   Caller: Spouse- Maryruth Hancock  [phone] x [phone] Summary of Call: Pt needs referral for colonoscopy, last one was about 5 years ago.  Went to [facility] last time.  He wants to have this done in june. Initial call taken by: Lowella Petties CMA,  May 16, 2010 2:17 PM  Follow-up for Phone Call        he is due after 06/26/10 I will do ref and route to [facility]  Follow-up by: Judith Part MD,  May 16, 2010 2:49 PM  Additional Follow-up for Phone Call Additional follow up Details #1::        colonoscopy set up for June 28th om 1:00pm with Dr Stan Head. Additional Follow-up by: Carlton Adam,  May 17, 2010 4:38 PM

## 2011-01-29 NOTE — Miscellaneous (Signed)
Summary: [facility] PV- pt on Plavix  Clinical Lists Changes   Pt arrived for PV, is taking Plavix.  Procedure cancelled and OV scheduled.

## 2011-01-29 NOTE — Progress Notes (Signed)
Summary: Aim-High Study  Medications Added CRESTOR 40 MG TABS (ROSUVASTATIN CALCIUM) take one tablet by mouth daily.       Phone Note Other Incoming   Summary of Call: received letter stating the Aim-High Study the pt is in is ending, per Dr Gala Romney he would like for the pt to change his simvastatin 80mg  to crestor 40mg  and decrease his niacin to 1000mg  daily, have called and left mess for pt to call back  Initial call taken by: Meredith Staggers, RN,  June 28, 2010 6:00 PM  Follow-up for Phone Call        Pt. called back. Information regarding Aim-high Study and MD recommendations given. A prescription for Crestor 40 mg  and Ramipril 5 mg [facility].  Follow-up by: Ollen Gross, RN, BSN,  June 29, 2010 9:12 AM    New/Updated Medications: CRESTOR 40 MG TABS (ROSUVASTATIN CALCIUM) take one tablet by mouth daily. Prescriptions: CRESTOR 40 MG TABS (ROSUVASTATIN CALCIUM) take one tablet by mouth daily.  #30 x 8   Entered by:   Ollen Gross, RN, BSN   Authorized by:   Dolores Patty, MD, [facility]   Signed by:   Ollen Gross, RN, BSN on 06/29/2010   Method used:   [facility]  [id number]       [address]       [place], [place]  [address]       Ph: [phone] or [phone]       Fax: [phone]   RxID:   [id number] RAMIPRIL 5 MG CAPS (RAMIPRIL) Take one capsule by mouth daily  #30 x 8   Entered by:   Ollen Gross, RN, BSN   Authorized by:   Dolores Patty, MD, [facility]   Signed by:   Ollen Gross, RN, BSN on 06/29/2010   Method used:   [facility]        [facility]  [address] (retail)       [address]       [place], [place]  [address]       Ph: [phone] or [phone]       Fax: [phone]   RxID:   [id number] CRESTOR 40 MG TABS (ROSUVASTATIN CALCIUM) take one tablet by mouth daily.  #30 x 8   Entered by:   Ollen Gross, RN, BSN   Authorized by:   Dolores Patty, MD, [place] [facility]   Signed by:   Ollen Gross, RN, BSN on 06/29/2010   Method used:   Electronically to        [facility] [id number] (retail)       [address] [place], [place]  [address]       Ph: [phone]       Fax: [phone]   RxID:   [id number]

## 2011-01-29 NOTE — Letter (Signed)
Summary: Letter Regarding AIM HIGH Trial  Letter Regarding AIM HIGH Trial   Imported By: Lanelle Bal 06/08/2010 08:41:21  _____________________________________________________________________  External Attachment:    Type:   Image     Comment:   External Document

## 2011-01-29 NOTE — Progress Notes (Signed)
Summary: $$ Change mailed to pt...  Phone Note Outgoing Call   Summary of Call: Pt came in for his cpx..Paid his copay and left his change on the edge of the front office counter. I called pts and spoke w/ wife. She requested that I mail the $10.00 to her, which I did. Wittnessed by Zella Ball.Daine Gip  October 02, 2010 12:54 PM  Initial call taken by: Daine Gip,  October 02, 2010 12:54 PM

## 2011-01-29 NOTE — Letter (Signed)
Summary: [facility] Instructions  [facility]  [address] [place], [place] [address]   Phone: [phone]  Fax: [phone]       Harry Payne    [birth year]    MRN: [id number]      Procedure Day Dorna BloomLulu Payne, 09/19/10     Arrival Time: 7:30 AM       Procedure Time: 8:30 AM    Location of Procedure:                    _X_   Endoscopy Center (4th Floor)  PREPARATION FOR COLONOSCOPY WITH MOVIPREP   Starting 5 days prior to your procedure 09/15/10 do not eat nuts, seeds, popcorn, corn, beans, peas,  salads, or any raw vegetables.  Do not take any fiber supplements (e.g. Metamucil, Citrucel, and Benefiber).  THE DAY BEFORE YOUR PROCEDURE         TUESDAY, 09/18/10  1.  Drink clear liquids the entire day-NO SOLID FOOD  2.  Do not drink anything colored red or purple.  Avoid juices with pulp.  No orange juice.  3.  Drink at least 64 oz. (8 glasses) of fluid/clear liquids during the day to prevent dehydration and help the prep work efficiently.  CLEAR LIQUIDS INCLUDE: Water Jello Ice Popsicles Tea (sugar ok, no milk/cream) Powdered fruit flavored drinks Coffee (sugar ok, no milk/cream) Gatorade Juice: apple, white grape, white cranberry  Lemonade Clear bullion, consomm, broth Carbonated beverages (any kind) Strained chicken noodle soup Hard Candy                         4.  In the morning, mix first dose of MoviPrep solution:    Empty 1 Pouch A and 1 Pouch B into the disposable container    Add lukewarm drinking water to the top line of the container. Mix to dissolve    Refrigerate (mixed solution should be used within 24 hrs)  5.  Begin drinking the prep at 5:00 p.m. The MoviPrep container is divided by 4 marks.   Every 15 minutes drink the solution down to the next mark (approximately 8 oz) until the full liter is complete.   6.  Follow completed prep with 16 oz of clear liquid of your choice (Nothing red or purple).  Continue to drink clear  liquids until bedtime.  7.  Before going to bed, mix second dose of MoviPrep solution:    Empty 1 Pouch A and 1 Pouch B into the disposable container    Add lukewarm drinking water to the top line of the container. Mix to dissolve    Refrigerate  Beginning at 9:00 p.m.         1. Every 15 minutes, drink the solution down to the next mark (approx 8 oz) until the full liter is complete.  2. Follow completed prep with 16 oz. of clear liquid of your choice.    THE DAY OF YOUR PROCEDURE      WEDNESDAY, 09/19/10  1. You may drink clear liquids until 6:30 AM (2 HOURS BEFORE PROCEDURE).  MEDICATION INSTRUCTIONS  Unless otherwise instructed, you should take regular prescription medications with a small sip of water   as early as possible the morning of your procedure.  Diabetic patients - see separate instructions.  Stop taking Plavix or Aggrenox on  _  _  (7 days before procedure).   You will be contacted by our office prior to your procedure  for directions on holding your Plavix.  If you do not hear from our office 1 week prior to your scheduled procedure, please call [phone] to discuss.        OTHER INSTRUCTIONS  You will need a responsible adult at least 59 years of age to accompany you and drive you home.   This person must remain in the waiting room during your procedure.  Wear loose fitting clothing that is easily removed.  Leave jewelry and other valuables at home.  However, you may wish to bring a book to read or  an iPod/MP3 player to listen to music as you wait for your procedure to start.  Remove all body piercing jewelry and leave at home.  Total time from sign-in until discharge is approximately 2-3 hours.  You should go home directly after your procedure and rest.  You can resume normal activities the  day after your procedure.  The day of your procedure you should not:   Drive   Make legal decisions   Operate machinery   Drink alcohol   Return to  work  You will receive specific instructions about eating, activities and medications before you leave.   Harry Payne, [id number]   The above instructions have been reviewed and explained to me by   _______________________    I fully understand and can verbalize these instructions _____________________________ Date _________

## 2011-01-29 NOTE — Procedures (Signed)
Summary: Colonoscopy  Patient: Harry Payne Note: All result statuses are Final unless otherwise noted.  Tests: (1) Colonoscopy (COL)   COL Colonoscopy           DONE     [facility]     [address]     [place], [place]  [address]           COLONOSCOPY PROCEDURE REPORT           PATIENT:  Harry Payne, Harry Payne  MR#:  [id number]     BIRTHDATE:  14-Mar-1951, 59 yrs. old  GENDER:  male     ENDOSCOPIST:  Iva Boop, MD, [facility]           PROCEDURE DATE:  09/19/2010     PROCEDURE:  Colonoscopy with snare polypectomy     ASA CLASS:  Class II     INDICATIONS:  surveillance and high-risk screening, history of     pre-cancerous (adenomatous) colon polyps two diminutive adenomas     removed 2006     MEDICATIONS:   Fentanyl 50 mcg IV, Versed 6 mg IV           DESCRIPTION OF PROCEDURE:   After the risks benefits and     alternatives of the procedure were thoroughly explained, informed     consent was obtained.  Digital rectal exam was performed and     revealed no abnormalities and normal prostate.   The LB [id number]     [id number] endoscope was introduced through the anus and advanced to     the cecum, which was identified by both the appendix and ileocecal     valve, without limitations.  The quality of the prep was     excellent, using MoviPrep.  The instrument was then slowly     withdrawn as the colon was fully examined. Insertion: 6:08 minutes     Withdrawal: 9:20 minutes     <<PROCEDUREIMAGES>>           FINDINGS:  A diminutive polyp was found. It was 3 mm in size.     Polyp was snared without cautery. Retrieval was successful.  This     was otherwise a normal examination of the colon. Including right     colon retroflexion.   Retroflexed views in the rectum revealed     internal hemorrhoids.  They were small.  The scope was then     withdrawn from the patient and the procedure completed.           COMPLICATIONS:  None     ENDOSCOPIC IMPRESSION:     1) 3 mm diminutive  polyp     2) Internal hemorrhoids     3) Otherwise normal examination, excellent prep     4) Prior diminutive adenoma (2) removal 2006     RECOMMENDATIONS:     Resume Plavix and all other current medications.     REPEAT EXAM:  In for Colonoscopy, pending biopsy results.           Iva Boop, MD, [facility]           CC:  The Patient           n.     eSIGNED:   Iva Boop at 09/19/2010 09:17 AM           Harry Payne, [id number]  Note: An exclamation mark (!) indicates a result that was not dispersed into the flowsheet. Document Creation Date: 09/19/2010 9:17  AM _______________________________________________________________________  (1) Order result status: Final Collection or observation date-time: 09/19/2010 09:07 Requested date-time:  Receipt date-time:  Reported date-time:  Referring Physician:   Ordering Physician: Stan Head [phone]) Specimen Source:  Source: Harry Payne Order Number: [id number] Lab site:   Appended Document: Colonoscopy     Procedures Next Due Date:    Colonoscopy: 09/2015

## 2011-01-29 NOTE — Letter (Signed)
Summary: Anticoagulation Modification Letter  [facility]  [address] [place], [place] [address]   Phone: [phone]  Fax: [phone]    September 10, 2010  Re:    Harry Payne DOB:    [birth year] MRN:  [id number]    Dear Dr. Gala Romney:  We have scheduled the above patient for an endoscopic procedure with Dr. Leone Payor. Our records show that  he is on anticoagulation therapy. Please advise as to how long the patient may come off their therapy of Plavix prior to the scheduled procedure on September 19, 2010.   Please append and route the completed form to Francee Piccolo, CMA (AAMA)   Thank you for your help with this matter.  Sincerely,  Francee Piccolo CMA [facility])   Physician Recommendation:  Hold Plavix 7 days prior ________________  Other ______________________________     Appended Document: Anticoagulation Modification Letter ok to hold Plavix for 7 days.   Appended Document: Anticoagulation Modification Letter Advised pt OK to hold Plavix per Dr. Gala Romney.  Pt will begin to hold on 9/15.  Hardcopy printed and filed in [facility] chart.

## 2011-01-29 NOTE — Assessment & Plan Note (Signed)
Summary: f85m      Allergies Added:   Visit Type:  Follow-up Primary Provider:  Judith Part MD  CC:  no complaints.  History of Present Illness: Harry Payne is a 60 y/o male with CAD s/p anterior MI 2004, DM2, and hyperlipidemia. Returns for routine f/u.  Doing great. No CP, SOB, orthopnea, PND. Walks constantly at work [employer].) Also goes to gym 4-5x week. Doing 30 minutes on TM (4-67mph) and some weights.   Compliant with medicines. Still in AIM-HIGH study through [facility].   Current Medications (verified): 1)  Protonix 40 Mg Tbec (Pantoprazole Sodium) .... Take 1 Tablet By Mouth Once A Day 2)  Plavix 75 Mg Tabs (Clopidogrel Bisulfate) .... Take 1 Tablet By Mouth Once A Day 3)  Onetouch Ultra Test   Strp (Glucose Blood) .... Ise To Test Daily and As Directed 4)  Coreg 25 Mg  Tabs (Carvedilol) .Marland Kitchen.. 1 By Mouth Two Times A Day 5)  Ramipril 5 Mg Caps (Ramipril) .... Take One Capsule By Mouth Daily 6)  Niaspan 1000 Mg  Tbcr (Niacin (Antihyperlipidemic)) .... 2 By Mouth Once Daily 7)  Adult Aspirin Low Strength 81 Mg  Tbdp (Aspirin) .... 2 By Mouth Once Daily 8)  Zocor 80 Mg  Tabs (Simvastatin) .Marland Kitchen.. 1 By Mouth Once Daily 9)  Metformin Hcl 500 Mg  Tabs (Metformin Hcl) .Marland Kitchen.. 1 By Mouth Two Times A Day 10)  Nitroglycerin 0.4 Mg Subl (Nitroglycerin) .... One Tablet Under Tongue Every 5 Minutes As Needed For Chest Pain---May Repeat Times Three 11)  Vitamin B-12 1000 Mcg Tabs (Cyanocobalamin) .... Take 1 Tablet By Mouth Once A Day  Allergies (verified): 1)  Ace Inhibitors  Vital Signs:  Patient profile:   60 year old male Height:      71 inches Weight:      228 pounds BMI:     31.91 BP sitting:   110 / 69 Cuff size:   regular  Vitals Entered By: Burnett Kanaris, CNA (January 23, 2010 2:02 PM)  Physical Exam  General:  Gen: well appearing. no resp difficulty HEENT: normal Neck: supple. no JVD. Carotids 2+ bilat; no bruits. No lymphadenopathy or thryomegaly appreciated. Cor:  PMI nondisplaced. Regular rate & rhythm. No rubs, gallops. 2/6 systolic murmur RSB. Lungs: clear Abdomen: soft, nontender, nondistended. No hepatosplenomegaly. No bruits or masses. Good bowel sounds. Extremities: no cyanosis, clubbing, rash, edema Neuro: alert & orientedx3, cranial nerves grossly intact. moves all 4 extremities w/o difficulty. affect pleasant    Other Orders: EKG w/ Interpretation (93000)  Patient Instructions: 1)  Follow up in 1 year Prescriptions: RAMIPRIL 5 MG CAPS (RAMIPRIL) Take one capsule by mouth daily  #30 x 11   Entered by:   Meredith Staggers, RN   Authorized by:   Dolores Patty, MD, [facility]   Signed by:   Meredith Staggers, RN on 01/23/2010   Method used:   [facility]  [address]       [address]       [place], [place]  [address]       Ph: [phone] or [phone]       Fax: [phone]   RxID:   [id number] COREG 25 MG  TABS (CARVEDILOL) 1 by mouth two times a day  #60 x 11   Entered by:   Meredith Staggers, RN   Authorized by:   Dolores Patty, MD, [facility]   Signed by:  Meredith Staggers, RN on 01/23/2010   Method used:   [facility]        [facility]  [id number]       [address]       [place], [place]  [address]       Ph: [phone] or [phone]       Fax: [phone]   RxID:   [id number] PLAVIX 75 MG TABS (CLOPIDOGREL BISULFATE) Take 1 tablet by mouth once a day  #30 x 11   Entered by:   Meredith Staggers, RN   Authorized by:   Dolores Patty, MD, [facility]   Signed by:   Meredith Staggers, RN on 01/23/2010   Method used:   [facility]        [facility]  [address] (retail)       [address]       [place], [place]  [address]       Ph: [phone] or [phone]       Fax: [phone]   RxID:   [id number]

## 2011-02-12 ENCOUNTER — Encounter: Payer: Self-pay | Admitting: Family Medicine

## 2011-02-13 ENCOUNTER — Encounter: Payer: Self-pay | Admitting: Internal Medicine

## 2011-02-13 ENCOUNTER — Ambulatory Visit (INDEPENDENT_AMBULATORY_CARE_PROVIDER_SITE_OTHER): Payer: BC Managed Care – PPO | Admitting: Internal Medicine

## 2011-02-13 DIAGNOSIS — I2510 Atherosclerotic heart disease of native coronary artery without angina pectoris: Secondary | ICD-10-CM

## 2011-02-16 ENCOUNTER — Emergency Department (HOSPITAL_COMMUNITY): Payer: BC Managed Care – PPO

## 2011-02-16 ENCOUNTER — Inpatient Hospital Stay (HOSPITAL_COMMUNITY)
Admission: EM | Admit: 2011-02-16 | Discharge: 2011-02-19 | DRG: 124 | Disposition: A | Discharge status: Home / Self Care | Payer: BC Managed Care – PPO | Attending: Internal Medicine | Admitting: Internal Medicine

## 2011-02-16 DIAGNOSIS — I4729 Other ventricular tachycardia: Secondary | ICD-10-CM | POA: Diagnosis present

## 2011-02-16 DIAGNOSIS — F411 Generalized anxiety disorder: Secondary | ICD-10-CM | POA: Diagnosis present

## 2011-02-16 DIAGNOSIS — I472 Ventricular tachycardia, unspecified: Secondary | ICD-10-CM | POA: Diagnosis present

## 2011-02-16 DIAGNOSIS — Z9861 Coronary angioplasty status: Secondary | ICD-10-CM

## 2011-02-16 DIAGNOSIS — I252 Old myocardial infarction: Secondary | ICD-10-CM

## 2011-02-16 DIAGNOSIS — K219 Gastro-esophageal reflux disease without esophagitis: Secondary | ICD-10-CM | POA: Diagnosis present

## 2011-02-16 DIAGNOSIS — R0789 Other chest pain: Principal | ICD-10-CM | POA: Diagnosis present

## 2011-02-16 DIAGNOSIS — E785 Hyperlipidemia, unspecified: Secondary | ICD-10-CM | POA: Diagnosis present

## 2011-02-16 DIAGNOSIS — G4700 Insomnia, unspecified: Secondary | ICD-10-CM | POA: Diagnosis present

## 2011-02-16 DIAGNOSIS — Z87891 Personal history of nicotine dependence: Secondary | ICD-10-CM

## 2011-02-16 DIAGNOSIS — I2510 Atherosclerotic heart disease of native coronary artery without angina pectoris: Secondary | ICD-10-CM | POA: Diagnosis present

## 2011-02-16 DIAGNOSIS — I10 Essential (primary) hypertension: Secondary | ICD-10-CM | POA: Diagnosis present

## 2011-02-16 DIAGNOSIS — Z7902 Long term (current) use of antithrombotics/antiplatelets: Secondary | ICD-10-CM

## 2011-02-16 LAB — BASIC METABOLIC PANEL
CO2: 26 mEq/L (ref 19–32)
Calcium: 9 mg/dL (ref 8.4–10.5)
GFR calc Af Amer: >60 mL/min (ref >60)
GFR calc non Af Amer: >60 mL/min (ref >60)
Sodium: 139 mEq/L (ref 135–145)

## 2011-02-16 LAB — CK TOTAL AND CKMB (NOT AT ARMC)
CK, MB: 2.6 ng/mL (ref 0.3–4.0)
Relative Index: 1.7 (ref 0.0–2.5)
Total CK: 156 U/L (ref 7–232)

## 2011-02-16 LAB — DIFFERENTIAL
Basophils Absolute: 0 10*3/uL (ref 0.0–0.1)
Basophils Relative: 0 % (ref 0–1)
Eosinophils Absolute: 0.2 10*3/uL (ref 0.0–0.7)
Eosinophils Relative: 3 % (ref 0–5)
Lymphocytes Relative: 26 % (ref 12–46)
Monocytes Absolute: 1 10*3/uL (ref 0.1–1.0)
Neutro Abs: 5.4 10*3/uL (ref 1.7–7.7)
Neutrophils Relative %: 60 % (ref 43–77)

## 2011-02-16 LAB — CBC
Hemoglobin: 15 g/dL (ref 13.0–17.0)
MCH: 31.4 pg (ref 26.0–34.0)
RBC: 4.78 MIL/uL (ref 4.22–5.81)

## 2011-02-16 LAB — PROTIME-INR
INR: 0.96 (ref 0.00–1.49)
Prothrombin Time: 13 seconds (ref 11.6–15.2)

## 2011-02-16 LAB — APTT: aPTT: 30 seconds (ref 24–37)

## 2011-02-17 DIAGNOSIS — I200 Unstable angina: Secondary | ICD-10-CM

## 2011-02-17 DIAGNOSIS — I2510 Atherosclerotic heart disease of native coronary artery without angina pectoris: Secondary | ICD-10-CM

## 2011-02-17 LAB — COMPREHENSIVE METABOLIC PANEL
ALT: 20 U/L (ref 0–53)
AST: 21 U/L (ref 0–37)
Albumin: 3.5 g/dL (ref 3.5–5.2)
Alkaline Phosphatase: 52 U/L (ref 39–117)
Chloride: 107 mEq/L (ref 96–112)
Creatinine, Ser: 0.87 mg/dL (ref 0.4–1.5)
GFR calc Af Amer: >60 mL/min (ref >60)
Potassium: 3.7 mEq/L (ref 3.5–5.1)
Sodium: 141 mEq/L (ref 135–145)
Total Bilirubin: 0.6 mg/dL (ref 0.3–1.2)

## 2011-02-17 LAB — LIPID PANEL
Triglycerides: 115 mg/dL (ref <150)
VLDL: 23 mg/dL (ref 0–40)

## 2011-02-17 LAB — CARDIAC PANEL(CRET KIN+CKTOT+MB+TROPI)
Relative Index: 1.6 (ref 0.0–2.5)
Relative Index: 1.6 (ref 0.0–2.5)
Total CK: 124 U/L (ref 7–232)
Troponin I: <0.01 ng/mL (ref 0.00–0.06)
Troponin I: 0.01 ng/mL (ref 0.00–0.06)

## 2011-02-17 LAB — CBC
MCH: 31.2 pg (ref 26.0–34.0)
MCHC: 34 g/dL (ref 30.0–36.0)
MCV: 91.5 fL (ref 78.0–100.0)
Platelets: 237 10*3/uL (ref 150–400)
RDW: 12.9 % (ref 11.5–15.5)

## 2011-02-17 NOTE — H&P (Signed)
NAME:  Harry Payne, Harry Payne NO.:  [id number]  MEDICAL RECORD NO.:  [id number]           PATIENT TYPE:  I  LOCATION:  2035                         FACILITY:  [facility]  PHYSICIAN:  Armanda Magic, M.D.     DATE OF BIRTH:  03-26-1951  DATE OF ADMISSION:  02/16/2011 DATE OF DISCHARGE:                             HISTORY & PHYSICAL   PRIMARY CARDIOLOGIST:  Bevelyn Buckles. Bensimhon, MD  CHIEF COMPLAINT:  Chest pain.  HISTORY OF PRESENT ILLNESS:  This is a 60 year old male with a history of CAD status post prior anterior wall MI in 2004 with PCI of the LAD and stage intervention of left circumflex, EF at that time was 41% with anterior apical scar by cardiac MRI.  He presented to the emergency room today with complaints of chest pain.  He says he had been doing well until 3 p.m. today when he developed chest discomfort, described as a dull ache, with no radiation, shortness of breath, nausea, vomiting, or diaphoresis.  At worst it was 4/10 and now it is resolved after sublingual nitroglycerin.  He says his chest pain is similar to his pain he have in his MI.  PAST MEDICAL HISTORY:  Includes CAD status post anterior wall MI in 2004 with PCI of the LAD and left circumflex, nonischemic dilated cardiomyopathy, EF 41% by cardiac MRI and Myoview in 2009, hypertension, dyslipidemia, nonsustained VT, GERD, anxiety, insomnia, and pneumonia in 2004.  ALLERGIES:  He has no known drug allergies.  SOCIAL HISTORY:  Remote tobacco, none for years.  He denies alcohol use. He is married.  FAMILY HISTORY:  His mother died of CHF.  His father died of cancer.  REVIEW OF SYSTEMS:  Other than what is stated in the HPI is negative.  PAST SURGICAL HISTORY:  Negative.  MEDICATIONS:  Include: 1. Plavix 75 mg daily. 2. Ramipril 5 mg daily. 3. Carvedilol 25 mg b.i.d. 4. Crestor 40 mg daily. 5. Aspirin 81 mg 2 tablets daily. 6. Omeprazole 20 mg 2 tablets daily.  PHYSICAL EXAM:  VITAL SIGNS:   Blood pressure is 122/69, heart rate 64, O2 saturation 96% on room air, respirations 19. GENERAL:  He is a well-developed, well-nourished white male, in no acute distress. HEENT:  Benign. NECK:  Supple without lymphadenopathy.  Carotid upstrokes are +2 bilaterally, no bruits. LUNGS:  Clear to auscultation throughout. HEART:  Regular rate and rhythm.  No murmurs, rubs, or gallops.  Normal S1 and S2. ABDOMEN:  Soft, nontender, nondistended with active bowel sounds.  No hepatosplenomegaly. EXTREMITIES:  No cyanosis, erythema, or edema, +2 dorsalis pedis pulses bilaterally.  LABS:  Sodium 139, potassium 3.8, chloride 105, bicarb 26, BUN 18, creatinine 1.04.  White cell count 9, hemoglobin 15, hematocrit 43.1, platelet count 231, INR 0.96.  CPK 156, MB 2.6, troponin 0.01.  Chest x- ray shows no active disease.  EKG shows sinus rhythm with T-wave inversions in V1 and V2 which are new from 2007.  ASSESSMENT: 1. Chest pain syndrome with negative cardiac enzymes x1, but new T-     wave inversions in V1 and V2. 2. Hypertension. 3. Coronary artery  disease status post remote anterior wall myocardial     infarction with percutaneous coronary intervention to left anterior     descending and left circumflex. 4. Dyslipidemia.  PLAN:  Admit to telemetry bed.  Cycle cardiac enzymes.  IV heparin drip, IV nitroglycerin drip, aspirin, and Plavix.  Check fasting lipid panel in the a.m.  We will check a D-dimer and probable cath on Monday per [facility].     Armanda Magic, M.D.     TT/MEDQ  D:  02/16/2011  T:  02/17/2011  Job:  [id number]  cc:   Bevelyn Buckles. Bensimhon, MD  Electronically Signed by Armanda Magic M.D. on 02/17/2011 05:50:58 PM

## 2011-02-18 LAB — GLUCOSE, CAPILLARY: Glucose-Capillary: 86 mg/dL (ref 70–99)

## 2011-02-18 LAB — CBC
HCT: 42.6 % (ref 39.0–52.0)
Platelets: 202 10*3/uL (ref 150–400)
RDW: 12.9 % (ref 11.5–15.5)
WBC: 9.5 10*3/uL (ref 4.0–10.5)

## 2011-02-18 LAB — POCT ACTIVATED CLOTTING TIME: Activated Clotting Time: 75 seconds

## 2011-02-19 DIAGNOSIS — R079 Chest pain, unspecified: Secondary | ICD-10-CM

## 2011-02-19 LAB — CBC
Hemoglobin: 14.2 g/dL (ref 13.0–17.0)
MCHC: 34.3 g/dL (ref 30.0–36.0)
RDW: 12.8 % (ref 11.5–15.5)

## 2011-02-19 LAB — BASIC METABOLIC PANEL
Calcium: 8.8 mg/dL (ref 8.4–10.5)
GFR calc Af Amer: >60 mL/min (ref >60)
GFR calc non Af Amer: >60 mL/min (ref >60)
Sodium: 139 mEq/L (ref 135–145)

## 2011-02-20 NOTE — Assessment & Plan Note (Signed)
Summary: f27m per pt call      Allergies Added:   Visit Type:  Follow-up Referring Provider:  Loreen Freud, DO Primary Provider:  Colon Flattery Tower MD  CC:  no complaints.  History of Present Illness: Harry Payne is a 60 y/o male with CAD s/p anterior MI 2004 (s/p stenting LAD and LCX), DM2, and hyperlipidemia (previously in AIM-HIGH trial). EF 48% by MR. Myoview 2009 EF 43%. anterior apical scar. no ischemia  Returns for routine f/u.  Doing great. No CP, SOB, orthopnea, PND. Walks constantly at work [employer].) Also goes to gym 1-2x week. Doing 30 minutes on TM (4-22mph) and some weights.   Compliant with medicines. Occasional chest wall soreness. No dyspnea. No   Dr. Milinda Antis following lipids.   Current Medications (verified): 1)  Plavix 75 Mg Tabs (Clopidogrel Bisulfate) .... Take 1 Tablet By Mouth Once A Day 2)  Onetouch Ultra Test   Strp (Glucose Blood) .... Ise To Test Daily and As Directed 3)  Coreg 25 Mg  Tabs (Carvedilol) .Marland Kitchen.. 1 By Mouth Two Times A Day 4)  Ramipril 5 Mg Caps (Ramipril) .... Take One Capsule By Mouth Daily 5)  Adult Aspirin Low Strength 81 Mg  Tbdp (Aspirin) .... 2 By Mouth Once Daily 6)  Metformin Hcl 500 Mg  Tabs (Metformin Hcl) .Marland Kitchen.. 1 By Mouth Two Times A Day 7)  Vitamin B-12 1000 Mcg Tabs (Cyanocobalamin) .... Take 1 Tablet By Mouth Once A Day 8)  Crestor 40 Mg Tabs (Rosuvastatin Calcium) .... Take One Tablet By Mouth Daily.  Allergies (verified): 1)  Ace Inhibitors  Past History:  Past Surgical History: Last updated: 02/09/2008 Left hand fracture (1990) Treadmill stress test (2001) Cardiolite- WNL, HTNsive response (05/1997) Admit CAD- stenting, cath (02/2003) Admit CP- not cardiac (03/2003) Cath- PTCA 2 vessel disease (02/2003) Stress cardiolite EF 33%, old infarct (11/2003) Admit CP (11/2004) Colonoscopy- polyps, hem (05/2005) Hosp- CP, r/o MI (07/2006) Nuclear cardiac study- anteroapical scar (01/2007)  Family History: Last updated:  September 18, 2010 Father: died age 67- prostate cancer Mother: died age 69 Siblings: 3 brothers- 2 with DM, 7 sisters- ok Family History of Colon Polyps: Brother x 2 No FH of Colon Cancer: Family History of Heart Disease: Brother  Social History: Last updated: 09/18/2010 works for [employer]- in [employer]  Former Smoker-stopped 7 years ago Marital Status: Married Children: 3 Alcohol Use - no Illicit Drug Use - no Patient gets regular exercise.  Risk Factors: Exercise: yes (09/18/2010)  Risk Factors: Smoking Status: quit (08/28/2007)  Past Medical History: Reviewed history from 07/17/2010 and no changes required. Anxiety Coronary artery disease    --s/p Anterior MI 2004. s/p stenting of LAD and LCx    --EF 48% by MRI   --Myoview 2009 EF 43%. anterior apical scar. no ischemia GERD Hyperlipidemia in AIM-HIGH trial DM 2 hypotension Adenomatous Colon Polyps Hemorrhoids  Family History: Reviewed history from 09-18-10 and no changes required. Father: died age 56- prostate cancer Mother: died age 32 Siblings: 3 brothers- 2 with DM, 7 sisters- ok Family History of Colon Polyps: Brother x 2 No FH of Colon Cancer: Family History of Heart Disease: Brother  Social History: Reviewed history from 09-18-2010 and no changes required. works for [employer]- in [employer]  Former Smoker-stopped 7 years ago Marital Status: Married Children: 3 Alcohol Use - no Illicit Drug Use - no Patient gets regular exercise.  Review of Systems       As per HPI and past medical history; otherwise all systems negative.  Vital Signs:  Patient profile:   60 year old male Height:      71 inches Weight:      229 pounds BMI:     32.05 Pulse rate:   67 / minute BP sitting:   118 / 74  (left arm) Cuff size:   regular  Vitals Entered By: Caralee Ates CMA (February 13, 2011 3:28 PM)  Physical Exam  General:  Well appearing. no resp difficulty HEENT: normal Neck: supple. no  JVD. Carotids 2+ bilat; no bruits. No lymphadenopathy or thryomegaly appreciated. Cor: PMI nondisplaced. Regular rate & rhythm. No rubs, gallops. 2/6 systolic murmur RSB. Lungs: clear Abdomen: soft, nontender, nondistended. No hepatosplenomegaly. No bruits or masses. Good bowel sounds. Extremities: no cyanosis, clubbing, rash, edema Neuro: alert & orientedx3, cranial nerves grossly intact. moves all 4 extremities w/o difficulty. affect pleasant   Impression & Recommendations:  Problem # 1:  CORONARY ARTERY DISEASE (ICD-414.00) Stable. No evidence of ischemia. Continue current regimen. Due for f/u stress test.   Orders: Treadmill (Treadmill)  Patient Instructions: 1)  Your physician has requested that you have an exercise tolerance test.  For further information please visit [email or web address]  Please also follow instruction sheet, as given. 2)  Your physician wants you to follow-up in:  1 year.  You will receive a reminder letter in the mail two months in advance. If you don't receive a letter, please call our office to schedule the follow-up appointment.

## 2011-02-20 NOTE — Discharge Summary (Signed)
NAME:  Harry Payne, Harry Payne NO.:  [id number]  MEDICAL RECORD NO.:  [id number]           PATIENT TYPE:  I  LOCATION:  2035                         FACILITY:  [facility]  PHYSICIAN:  Harry Payne, MDDATE OF BIRTH:  October 26, 1951  DATE OF ADMISSION:  02/16/2011 DATE OF DISCHARGE:  02/19/2011                              DISCHARGE SUMMARY   PRIMARY CARDIOLOGIST:  Harry Buckles. Shaton Lore, MD  DISCHARGE DIAGNOSIS:  Noncardiac chest pain (likely musculoskeletal). A.  Cardiac catheterization, February 18, 2011:  Nonobstructive residual coronary artery disease.  Patent stents.  Mild left ventricular dysfunction, left ventricular ejection fraction 50% with anterior dyskinesis.  SECONDARY DIAGNOSES: 1. Coronary artery disease.     a.     Anterior wall myocardial infarction in 2004 with      percutaneous coronary intervention of left anterior descending,      staged intervention of left circumflex, left ventricular ejection      fraction 41% on cardiac MRI with anterior apical scar. 2. Hypertension. 3. Dyslipidemia. 4. Nonsustained ventricular tachycardia. 5. Gastroesophageal reflux disease. 6. Anxiety. 7. Insomnia. 8. History of pneumonia in 2004.  ALLERGIES:  NKDA.  PROCEDURES: 1. EKG, February 16, 2011:  Normal sinus rhythm with T-wave inversions     in V1 and V2, which are new from prior tracing completed in 2007. 2. Chest x-ray, February 16, 2011:  No active disease. 3. Cardiac catheterization, February 18, 2011:  Obtuse marginal     normal.  Left anterior descending proximal stent widely patent,     mild mid luminal irregularities, distal vessel somewhat atretic.     First and second diagonal small and normal.  Circumflex and     atrioventricular groove had proximal luminal irregularities, mid     25% stenosis, mid stent widely patent with luminal irregularities.     Mid obtuse marginal large branching with diffuse luminal     irregularities.  Right coronary  artery, mild luminal     irregularities.  Posterior descending artery with 25% proximal     stenosis, very large vessel.  Posterolateral large with tandem 25%     and 30% lesions.  Left ventricular ejection fraction 50% with     anterior dyskinesis.  HISTORY OF PRESENT ILLNESS:  Harry Payne is a 60 year old gentleman who was evaluated in the emergency department in the late evening of February 16, 2011, for complaints of chest discomfort.  The patient described a dull ache with no radiation, shortness of breath, nausea, vomiting, or diaphoresis.  At worst, it was 4/10 and had resolved after sublingual nitroglycerin.  He describes the character of his symptoms being very similar to his prior angina.  The patient noted this discomfort while moving an air-conditioning unit.  New T-wave inversions in V1 and V2.  No other objective evidence of cardiac etiology but concerning story given similarity to prior ischemic chest pain.  HOSPITAL COURSE:  The patient was admitted and cardiac enzymes were cycled, which were negative x3.  The patient had no recurrence of symptoms leading to his evaluation in the emergency department. However, due to his concerning history given the similarity of  his symptoms to his prior angina and new Q-wave inversions are noted in the V1 and V2 compared to old tracing.  The patient was set up for diagnostic cardiac catheterization for Monday, February 18, 2011.  Prior to this, she was seen by his primary cardiologist and cardiac catheterization was completed by Harry Payne.  Luckily, there was no significant disease found (please see procedure section/discharge diagnoses section).  His thought that likely the patient had musculoskeletal plain related to his significant exertion moving air conditioner at the time of symptoms.  There will be no changes to his preadmission medication regimen, and he will follow up in [facility] in approximately 2 weeks  for a post cath check.  Otherwise, he is to follow up his primary care provider as previously scheduled.  At the time of his discharge, the patient received his old medication list, followup instructions, and post cath instructions.  All questions and concerns were addressed prior to his leaving the hospital.  DISCHARGE LABORATORIES:  WBC 6.6, HGB 14.2, HCT 41.4, PLT count is to 213.  Sodium 139, potassium 3.8, chloride 108, bicarb 25, BUN 15, creatinine 0.88, glucose 109.  Liver function tests were within normal limits on date of admission.  Cardiac enzymes are negative x3.  Protime 13.0, INR 0.96.  Total cholesterol 87, triglycerides 115, HDL 25, LDL 39, total cholesterol/HDL ratio 3.5.  FOLLOWUP PLANS AND APPOINTMENTS:  Please see hospital course.  DISCHARGE MEDICATIONS: 1. Sublingual nitroglycerin 0.4 mg q.5 minutes up 2 doses p.r.n. for     chest discomfort. 2. Carvedilol 25 mg 1 tablet p.o. b.i.d. 3. Crestor 40 mg 1 tablet p.o. nightly. 4. Plavix 75 mg 1 tablet p.o. daily. 5. Ramipril 5 mg 1 capsule p.o. daily. 6. Vitamin B12 1000 mcg 1 tablet p.o. daily.  DURATION OF DISCHARGE ENCOUNTER:  Including physician time was 35 minutes.     Harry Payne   ______________________________ Harry Buckles. Stana Bayon, MD    MS/MEDQ  D:  02/19/2011  T:  02/19/2011  Job:  [id number]  cc:   Harry Newcomer, PA-C  Electronically Signed by Harry Payne Payne on 02/19/2011 01:17:11 PM Electronically Signed by Harry Meres MD on 02/20/2011 01:58:50 PM

## 2011-02-28 ENCOUNTER — Encounter: Payer: Self-pay | Admitting: Physician Assistant

## 2011-02-28 ENCOUNTER — Encounter (INDEPENDENT_AMBULATORY_CARE_PROVIDER_SITE_OTHER): Payer: BC Managed Care – PPO | Admitting: Physician Assistant

## 2011-02-28 DIAGNOSIS — I2510 Atherosclerotic heart disease of native coronary artery without angina pectoris: Secondary | ICD-10-CM

## 2011-03-07 NOTE — Assessment & Plan Note (Signed)
Summary: eph. per mat pa.gd      Allergies Added:   Visit Type:  post hospital Referring Provider:  Loreen Freud, DO Primary Provider:  Colon Flattery Tower MD  CC:  no compliants.  History of Present Illness: Primary Cardiologist:  Dr. Arvilla Meres  Harry Payne is a s39 yo male with CAD s/p anterior MI 2004 (s/p stenting LAD and LCX), DM2, and hyperlipidemia (previously in AIM-HIGH trial). EF 48% by MR. Myoview 2009 EF 43%. anterior apical scar. no ischemia.  He had recently seen Dr. Gala Romney in followup.  A Myoview was scheduled.  However, the patient presented to [facility] 02/16/11 with chest pain.  He ruled out for myocardial infarction.  Cardiac catheterization done 02/18/2011: Proximal LAD stent patent; midcircumflex 25%, circumflex stent patent; RCA 25% of the PDA, proximal PDA 25%; posterior lateral 25-30%; EF 50% with anterior dyskinesis.  Given the non-obstructive nature of his anatomy, it was felt that his chest pain was noncardiac.  It is questioned whether or not this was musculoskeletal.  Medical therapy was continued and he was asked to followup today.  Since discharge, he has felt well.  He denies any further chest pain.  He denies shortness of breath.  He denies orthopnea, PND or edema.  He denies syncope.  He denies palpitations.  Current Medications (verified): 1)  Plavix 75 Mg Tabs (Clopidogrel Bisulfate) .... Take 1 Tablet By Mouth Once A Day 2)  Onetouch Ultra Test   Strp (Glucose Blood) .... Ise To Test Daily and As Directed 3)  Coreg 25 Mg  Tabs (Carvedilol) .Marland Kitchen.. 1 By Mouth Two Times A Day 4)  Ramipril 5 Mg Caps (Ramipril) .... Take One Capsule By Mouth Daily 5)  Adult Aspirin Low Strength 81 Mg  Tbdp (Aspirin) .... 2 By Mouth Once Daily 6)  Metformin Hcl 500 Mg  Tabs (Metformin Hcl) .Marland Kitchen.. 1 By Mouth Two Times A Day 7)  Vitamin B-12 1000 Mcg Tabs (Cyanocobalamin) .... Take 1 Tablet By Mouth Once A Day 8)  Crestor 40 Mg Tabs (Rosuvastatin Calcium) .... Take  One Tablet By Mouth Daily.  Allergies (verified): 1)  Ace Inhibitors  Past History:  Past Medical History: Anxiety Coronary artery disease    --s/p Anterior MI 2004. s/p stenting of LAD and LCx    --EF 48% by MRI   --Myoview 2009 EF 43%. anterior apical scar. no ischemia   --Cath 2/12: LAD and CFX stents ok; mCFX 25%; RCA 25%; PDA 25%; PL 25-30%; EF 50% GERD Hyperlipidemia in AIM-HIGH trial DM 2 hypotension Adenomatous Colon Polyps Hemorrhoids  Vital Signs:  Patient profile:   60 year old male Height:      71 inches Weight:      226 pounds BMI:     31.63 Pulse rate:   68 / minute BP sitting:   122 / 74  (left arm) Cuff size:   regular  Vitals Entered By: Hardin Negus, RMA (February 28, 2011 11:48 AM)  Physical Exam  General:  Well nourished, well developed, in no acute distress HEENT: normal Neck: no JVD Cardiac:  normal S1, S2; RRR; no murmur Lungs:  clear to auscultation bilaterally, no wheezing, rhonchi or rales Abd: soft, nontender, no hepatomegaly Ext: no edema; RFA site without hematoma or bruit Skin: warm and dry Neuro:  CNs 2-12 intact, no focal abnormalities noted    EKG  Procedure date:  02/28/2011  Findings:      normal sinus rhythm Heart rate 68 Normal axis  Q waves in leads V1-V3 Nonspecific ST-T wave changes  Impression & Recommendations:  Problem # 1:  CORONARY ARTERY DISEASE (ICD-414.00) Doing well post catheterization.  He will continue on Plavix and aspirin and statin therapy.  He has had no further chest pain.  No further testing is warranted.  He can follow up with Dr. Teena Dunk in 6 months or return sooner as needed.  Problem # 2:  HYPERLIPIDEMIA (ICD-272.4) Followed by primary care His updated medication list for this problem includes:    Crestor 40 Mg Tabs (Rosuvastatin calcium) .Marland Kitchen... Take one tablet by mouth daily.  Other Orders: EKG w/ Interpretation (93000)  Patient Instructions: 1)  Your physician wants you to follow-up in:   6 months with Dr. Gala Romney. You will receive a reminder letter in the mail two months in advance. If you don't receive a letter, please call our office to schedule the follow-up appointment. 2)  Your physician recommends that you continue on your current medications as directed. Please refer to the Current Medication list given to you today.

## 2011-03-14 ENCOUNTER — Telehealth (INDEPENDENT_AMBULATORY_CARE_PROVIDER_SITE_OTHER): Payer: Self-pay | Admitting: *Deleted

## 2011-03-14 LAB — GLUCOSE, CAPILLARY: Glucose-Capillary: 107 mg/dL — ABNORMAL HIGH (ref 70–99)

## 2011-03-14 NOTE — Procedures (Signed)
  NAME:  Harry Payne, Harry Payne NO.:  [id number]  MEDICAL RECORD NO.:  [id number]           PATIENT TYPE:  I  LOCATION:  2035                         FACILITY:  [facility]  PHYSICIAN:  Rollene Rotunda, MD, FACCDATE OF BIRTH:  01/12/1951  DATE OF PROCEDURE: DATE OF DISCHARGE:                           CARDIAC CATHETERIZATION   PROCEDURE:  Left heart catheterization/coronary arteriography.  INDICATIONS:  Evaluate the patient with chest pain.  He has had previous stenting to his circumflex and his LAD.  Chest pain was reminiscent of previous unstable angina (411.1).  PROCEDURE NOTE:  Left heart catheterization performed via right femoral artery.  The artery was cannulated using the anterior wall puncture.  A #5-French arterial sheath was inserted via the modified Seldinger technique.  Preformed Judkins and a pigtail catheter were utilized.  The patient tolerated the procedure well and left the lab in stable condition.  RESULTS:  Hemodynamics:  LV 135/12, AO 134/75.  Coronaries:  Left main was normal.  The LAD had a proximal stent which was widely patent.  There were some mild mid-luminal irregularities. The distal vessel was somewhat atretic.  First and second diagonal were small and normal.  Circumflex in the AV groove had proximal luminal irregularities.  There was mid 25% stenosis.  There is a mid stent which was widely patent with luminal irregularities.  The mid-obtuse marginal was large and branching with diffuse luminal irregularities.  The right coronary artery had mid-luminal irregularities.  There was 25% stenosis at the PDA.  PDA was very large with long proximal 25% stenosis. Posterolateral was large with tandem 25% and 30% lesions.  Left ventriculogram:  The left ventriculogram was obtained in the RAO projection.  The EF was 50% with anterior dyskinesis.  CONCLUSION:  Nonobstructive residual coronary disease.  Patent stents as described.  Mild left  ventricular dysfunction.  PLAN:  No further cardiovascular workup is suggested.  The patient will continue to have aggressive risk reduction.     Rollene Rotunda, MD, [facility]     JH/MEDQ  D:  02/18/2011  T:  02/19/2011  Job:  [id number]  Electronically Signed by Rollene Rotunda MD [facility] on 03/14/2011 07:56:18 PM

## 2011-03-18 ENCOUNTER — Other Ambulatory Visit: Payer: Self-pay | Admitting: Family Medicine

## 2011-03-18 ENCOUNTER — Other Ambulatory Visit (INDEPENDENT_AMBULATORY_CARE_PROVIDER_SITE_OTHER): Payer: BC Managed Care – PPO

## 2011-03-18 ENCOUNTER — Encounter: Payer: Self-pay | Admitting: *Deleted

## 2011-03-18 DIAGNOSIS — I959 Hypotension, unspecified: Secondary | ICD-10-CM

## 2011-03-18 DIAGNOSIS — E119 Type 2 diabetes mellitus without complications: Secondary | ICD-10-CM

## 2011-03-18 DIAGNOSIS — E785 Hyperlipidemia, unspecified: Secondary | ICD-10-CM

## 2011-03-18 LAB — RENAL FUNCTION PANEL
CO2: 28 mEq/L (ref 19–32)
Chloride: 103 mEq/L (ref 96–112)
Creatinine, Ser: 0.9 mg/dL (ref 0.4–1.5)
GFR: 98.02 mL/min (ref >60.00)
Sodium: 138 mEq/L (ref 135–145)

## 2011-03-18 LAB — AST: AST: 18 U/L (ref 0–37)

## 2011-03-18 LAB — HEMOGLOBIN A1C: Hgb A1c MFr Bld: 6.7 % — ABNORMAL HIGH (ref 4.6–6.5)

## 2011-03-18 LAB — LIPID PANEL: Cholesterol: 112 mg/dL (ref 0–200)

## 2011-03-19 NOTE — Progress Notes (Signed)
   Faxed Accidential Claim Form over to [employer],Mailed Original to [facility]  March 14, 2011 9:45 AM

## 2011-03-21 ENCOUNTER — Encounter: Payer: BC Managed Care – PPO | Admitting: Internal Medicine

## 2011-03-26 ENCOUNTER — Other Ambulatory Visit: Payer: Self-pay | Admitting: Internal Medicine

## 2011-03-28 ENCOUNTER — Other Ambulatory Visit: Payer: Self-pay

## 2011-03-28 NOTE — Telephone Encounter (Signed)
[address] °

## 2011-03-29 ENCOUNTER — Other Ambulatory Visit: Payer: Self-pay | Admitting: Emergency Medicine

## 2011-04-01 ENCOUNTER — Other Ambulatory Visit: Payer: Self-pay | Admitting: Emergency Medicine

## 2011-04-01 MED ORDER — CLOPIDOGREL BISULFATE 75 MG PO TABS: 75.0000 mg | ORAL_TABLET | Freq: Every day | ORAL | Status: DC

## 2011-04-01 NOTE — Telephone Encounter (Signed)
rx sent into pharmacy

## 2011-04-03 ENCOUNTER — Ambulatory Visit (INDEPENDENT_AMBULATORY_CARE_PROVIDER_SITE_OTHER): Payer: BC Managed Care – PPO | Admitting: Family Medicine

## 2011-04-03 ENCOUNTER — Encounter: Payer: Self-pay | Admitting: Family Medicine

## 2011-04-03 DIAGNOSIS — K219 Gastro-esophageal reflux disease without esophagitis: Secondary | ICD-10-CM

## 2011-04-03 DIAGNOSIS — E119 Type 2 diabetes mellitus without complications: Secondary | ICD-10-CM

## 2011-04-03 DIAGNOSIS — E785 Hyperlipidemia, unspecified: Secondary | ICD-10-CM

## 2011-04-03 NOTE — Patient Instructions (Signed)
Don't forget your eye doctor visit to schedule in June Ask cardiology about your stomach medicine since your insurance will not pay for protonix and omeprazole is not a good mix with plavix  You can use zantac instead 150 mg twice daily  Get back to regular exercise and work on weight loss  Schedule follow up in 6 months with labs prior

## 2011-04-03 NOTE — Assessment & Plan Note (Signed)
Disc interaction of prilosec with plavix  Pt cannot afford protonix  Disc opt of H2 if it works He will disc with his cardiologist as well  Disc reflux diet

## 2011-04-03 NOTE — Assessment & Plan Note (Signed)
This is up a bit but overall at goal for CAD and DM  Urged to keep working on HDL with niacin and exercise  Rev labs in detail Lab and f/u 6 mo  Rev low sat fat diet

## 2011-04-03 NOTE — Assessment & Plan Note (Signed)
Worse without exercise with AIC 6.7-- but working on that Good DM diet Will make opthy appt for June on his own  Rev exercise recommendation and wt loss  On ace  Lab and f/u in 6 mo

## 2011-04-03 NOTE — Progress Notes (Signed)
Subjective:    Patient ID: Harry Payne, male    DOB: [birth year], 60 y.o.   MRN: [id number]  HPI Here for f/u of DM and lipids in pt with known CAD Is feeling good overall   Saw cardiol-- for cp that was musculoskeletal -- from lifting heavy object  Heart checked out ok   Wt is up 5 lb with bp of 104/64--good  On metformin for DM  AIc is 6.7 up from 6.6 Diet --- is better overall  Not in gym as much- work is problem  opthy-- will be due in June  Is on ace   Lipids with LDL 65-- good but up a bit from previous, HDL up at 30  On crestor and niaspan currently  Lab Results  Component Value Date   CHOL 112 03/18/2011   CHOL  Value: 87        ATP III CLASSIFICATION:  <200     mg/dL   Desirable  409-811  mg/dL   Borderline High  >=914    mg/dL   High        7/82/9562   CHOL 130 10/02/2010   Lab Results  Component Value Date   HDL 30.20* 03/18/2011   HDL 25* 02/17/2011   HDL 31.90* 10/02/2010   Lab Results  Component Value Date   LDLCALC 65 03/18/2011   LDLCALC  Value: 39        Total Cholesterol/HDL:CHD Risk Coronary Heart Disease Risk Table                     Men   Women  1/2 Average Risk   3.4   3.3  Average Risk       5.0   4.4  2 X Average Risk   9.6   7.1  3 X Average Risk  23.4   11.0        Use the calculated Patient Ratio above and the CHD Risk Table to determine the patient's CHD Risk.        ATP III CLASSIFICATION (LDL):  <100     mg/dL   Optimal  130-865  mg/dL   Near or Above                    Optimal  130-159  mg/dL   Borderline  784-696  mg/dL   High  >295     mg/dL   Very High 2/84/1324   LDLCALC 80 10/02/2010   Lab Results  Component Value Date   TRIG 85.0 03/18/2011   TRIG 115 02/17/2011   TRIG 93.0 10/02/2010   Lab Results  Component Value Date   CHOLHDL 4 03/18/2011   CHOLHDL 3.5 02/17/2011   CHOLHDL 4 10/02/2010    Cannot afford protonix -- but on plavix so cannot take omeprazole  ? What to do  Has symptoms of GERD if off med   Past Medical History    Diagnosis Date  . Anxiety   . CAD (coronary artery disease)   . GERD (gastroesophageal reflux disease)   . Other and unspecified hyperlipidemia   . Depression   . Hypotension   . Hx of adenomatous colonic polyps   . Hemorrhoids     Past Surgical History  Procedure Date  . Closed reduction hand fracture 1990    left hand    History   Social History  . Marital Status: Married    Spouse Name: N/A    Number of  Children: 3  . Years of Education: N/A   Occupational History  . 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 Smoking status: Former Smoker    Quit date: 12/31/2003  . Smokeless tobacco: Not on file  . Alcohol Use: No  . Drug Use: No  . Sexually Active: Not on file   Other Topics Concern  . Not on file   Social History Narrative  . No narrative on file    Family History  Problem Relation Age of Onset  . Prostate cancer Father   . Diabetes Brother   . Diabetes Brother       Review of Systems  Constitutional: Positive for activity change. Negative for appetite change, fatigue and unexpected weight change.  HENT: Negative for congestion.   Eyes: Negative for pain and visual disturbance.  Respiratory: Negative for cough, shortness of breath and wheezing.   Cardiovascular: Negative.   Gastrointestinal: Negative for abdominal pain, diarrhea and constipation.  Genitourinary: Negative for urgency and frequency.  Musculoskeletal: Negative for myalgias and arthralgias.  Skin: Negative for color change and pallor.  Neurological: Negative for weakness, numbness and headaches.  Hematological: Negative for adenopathy. Does not bruise/bleed easily.  Psychiatric/Behavioral: Negative for dysphoric mood. The patient is not nervous/anxious.        Objective:   Physical Exam  Constitutional: He appears well-developed and well-nourished.       overwt and well appearing   HENT:  Head: Normocephalic and atraumatic.  Eyes: Conjunctivae and EOM are  normal. Pupils are equal, round, and reactive to light.  Neck: Neck supple. No JVD present.  Cardiovascular: Normal rate, regular rhythm and normal heart sounds.  PMI is not displaced.   Pulmonary/Chest: Effort normal and breath sounds normal.  Abdominal: Soft. Bowel sounds are normal. He exhibits no mass.  Musculoskeletal: He exhibits no tenderness.  Lymphadenopathy:    He has no cervical adenopathy.  Neurological: He has normal reflexes. He displays normal reflexes.  Skin: Skin is warm and dry. No erythema. No pallor.  Psychiatric: He has a normal mood and affect.          Assessment & Plan:

## 2011-04-04 ENCOUNTER — Ambulatory Visit: Payer: Self-pay | Admitting: Family Medicine

## 2011-04-12 ENCOUNTER — Ambulatory Visit: Payer: Self-pay | Admitting: Family Medicine

## 2011-04-30 ENCOUNTER — Telehealth: Payer: Self-pay | Admitting: *Deleted

## 2011-04-30 NOTE — Telephone Encounter (Signed)
Completed form faxed to [phone] as instructed. Copy retained at my desk if needed and form given to Alva.

## 2011-04-30 NOTE — Telephone Encounter (Signed)
Form done in IN box

## 2011-04-30 NOTE — Telephone Encounter (Signed)
Prior auth is needed for omeprazole, form is on your shelf. 

## 2011-05-01 ENCOUNTER — Telehealth: Payer: Self-pay | Admitting: *Deleted

## 2011-05-01 NOTE — Telephone Encounter (Signed)
Prior auth given for pantoprazole, advised pharmacy, approval letter placed on doctor's desk for signature and scanning.

## 2011-05-14 NOTE — Assessment & Plan Note (Signed)
[facility]                            CARDIOLOGY OFFICE NOTE   NAME:COLEMANElisa, Sorlie                      MRN:          [id number]  DATE:09/07/2007                            DOB:          Mar 01, 1951    INTERVAL HISTORY:  Mr. Waltman is a very pleasant 60 year old with a  history of coronary artery disease, status post previous anterior wall  myocardial infarction in 2004, treated with angioplasty and stenting of  the LAD with a staged intervention of the circumflex.  EF by MRI was 48%  with anterior apical scar.  He has a history of hypertension and  hyperlipidemia with low HDL, currently in the Aim High Study as well as  diabetes.  A recent Myoview showed an EF of 43% with a large anterior  apical scar, no ischemia.   He returns today for routine followup.  He is doing well.  No chest pain  or shortness of breath.  No heart failure symptoms.  He has recently  gotten laid off from his job and has really not been doing much of  anything.  However, he does continue to go to the gym 3-4 days a week  and walks on the treadmill for about 25-30 minutes at 3.9 miles an hour  without any difficulty.   CURRENT MEDICATIONS:  1. Plavix 75 a day.  2. Altace 2.5 b.i.d.  3. Protonix 40 a day.  4. Niaspan 2,000 mg a day.  5. Aspirin 81 mg, three tablets a day.  6. Zocor 80 a day.  7. Coreg 18.75 b.i.d.   PHYSICAL EXAMINATION:  GENERAL:  He is well-appearing, no acute  distress, respirations are unlabored.  VITAL SIGNS:  Blood pressure is 106/70, heart rate is 70, weight is 230.  HEENT:  Normal.  NECK:  Supple.  No JVD.  Carotids are 2 plus bilaterally without any  bruits.  There is no lymphadenopathy or thyromegaly.  CARDIAC:  PMI is nondisplaced.  He has a regular rate and rhythm.  No  murmurs, rubs, or gallops with distant heart sounds.  LUNGS:  Clear.  ABDOMEN:  Soft, nontender, nondistended.  Mildly obese.  No  hepatosplenomegaly.  No bruits.  No  masses.  Good bowel sounds.  EXTREMITIES:  Warm with no cyanosis, clubbing, or edema.  No rash.  NEUROLOGIC:  He is alert and oriented x3.  Cranial nerves II-XII are  intact.  He moves all 4 extremities without difficulty.  Affect is very  pleasant.   EKG shows a normal sinus rhythm with a rate of 68.  There is a previous  anterior septal infarct, mild T wave inversion in the high lateral wall  which is essentially unchanged.   ASSESSMENT/PLAN:  1. Coronary artery disease, status post previous myocardial      infarction.  He is doing quite well without any evidence of      ischemia.  Continue current therapy.  2. Mild left ventricular dysfunction.  We will increase his Coreg up      to 25 mg twice a day.  I told him to cut back to  18.75 b.i.d. if he      was unable to tolerate this from a blood pressure point-of-view.  3. Hypertension, well controlled.  4. Hyperlipidemia, followed by the Aim High Study.  I have asked him      to get me a copy of his lipids, so I can track it and make sure his      LDL is below 70.   DISPOSITION:  Return to clinic for routine followup in 6 months.     Bevelyn Buckles. Bensimhon, MD  Electronically Signed    DRB/MedQ  DD: 09/07/2007  DT: 09/07/2007  Job #: [id number]

## 2011-05-14 NOTE — Assessment & Plan Note (Signed)
[facility]                            CARDIOLOGY OFFICE NOTE   CRIST, KRUSZKA                      MRN:          [id number]  DATE:08/11/2008                            DOB:          1951-06-10    PRIMARY CARE PHYSICIAN:  Marne A. Tower, MD   INTERVAL HISTORY:  Harry Payne is a very pleasant 60 year old male with  history of coronary artery disease, status post previous anterior wall  myocardial infarction in 2004, treated with angioplasty and stenting.  The LAD with staged intervention of the circumflex.  EF by MRI was 48%  with anterior apical scar.  He has a history of hypertension and  hyperlipidemia with low HDL, currently in the AIM-HIGH study.  He also  has a history of diabetes.  A recent Myoview showed an ejection fraction  of 43% with large anterior apical scar and no ischemia.  He has been  following with Dr. Milinda Antis recently for his lipids and his LDL was 61.   He returns today for routine followup.  He is doing very well.  After an  8 months lay off, he has got a new job.  He is working at __________.  He walks extensively, he says almost 15 miles a day.  No chest pain or  shortness of breath.  No orthopnea, no PND.  He has been compliant with  all of his medications.   CURRENT MEDICATIONS:  1. Plavix 75 a day.  2. Altace 2.5 a day.  3. Protonix 40 a day.  4. Niaspan 2000 mg a day.  5. Zocor 80 a day.  6. Coreg 25 b.i.d.  7. Aspirin 162 a day.   PHYSICAL EXAMINATION:  GENERAL:  He is well-appearing, in no acute  distress.  He ambulates around the clinic without respiratory  difficulty.  VITAL SIGNS:  Blood pressure is 112/68, heart rate 62, weight is 221,  which is down almost 10 pounds.  HEENT:  Normal.  NECK:  Supple.  No JVD.  Carotids are 2+ bilaterally without any bruits.  There is no lymphadenopathy or thyromegaly.  CARDIAC:  PMI is nondisplaced.  Regular rate and rhythm.  No murmurs,  rubs or gallops.  LUNGS:  Clear.  ABDOMEN:  Soft, nontender, and nondistended.  No hepatosplenomegaly.  No  bruits.  No masses.  Good bowel sounds.  EXTREMITIES:  Warm with no cyanosis, clubbing, or edema.  No rash.  NEURO:  Alert and oriented x3.  Cranial nerves II-XII are intact.  Moves  all 4 without difficulty.  Affect is pleasant.   EKG shows sinus rhythm at a rate of 62 with anterior septal Q waves.   ASSESSMENT AND PLAN:  1. Coronary artery disease.  This is stable.  No evidence of ischemia.      Continue medical therapy.  2. Hypertension, well-controlled.  3. Mild left ventricular dysfunction.  He is on good medicines.  No      evidence of heart failure.  Continue current therapy.  4. Hyperlipidemia, followed by Dr. Milinda Antis.  In the AIM-HIGH study, LDL      looks good.  Goal is under 70.   DISPOSITION:  We will see him back in about 9 months' time for routine  followup.     Bevelyn Buckles. Bensimhon, MD  Electronically Signed    DRB/MedQ  DD: 08/11/2008  DT: 08/12/2008  Job #: [id number]   cc:   Marne A. Milinda Antis, MD

## 2011-05-17 NOTE — Discharge Summary (Signed)
NAME:  Harry Payne, HOLLABAUGH NO.:  [id number]   MEDICAL RECORD NO.:  [id number]          PATIENT TYPE:  INP   LOCATION:  4714                         FACILITY:  [facility]   PHYSICIAN:  Corwin Levins, M.D. LHCDATE OF BIRTH:  [birth year]   DATE OF ADMISSION:  07/26/2006  DATE OF DISCHARGE:  07/27/2006                                 DISCHARGE SUMMARY   DISCHARGE DIAGNOSES:  1.  Chest pain, atypical.  Serial cardiac enzymes negative.  2.  History of coronary artery disease status post MI with stent x2.  3.  Stress Cardiolite negative December 2005.  4.  History of nonsustained VT.  5.  History of GERD off PPI for 1 year.  6.  Hypercholesterolemia.  7.  Anxiety.  8.  Insomnia.  9.  History of pneumonia 2004.   PROCEDURES:  None.   CONSULTS:  None.   HISTORY AND PHYSICAL:  See that dictated date of admission July 26, 2006.   HOSPITAL COURSE:  Mr. Brumbach is a 60 year old very nice white male who  presents July 28th with atypical chest discomfort as detailed previously.  He did quite nicely overnight with no further discomfort.  Laboratories  include serial cardiac enzymes.  Telemetry and ECGs were negative except for  one 7-beat episode of nonsustained VT to which he remained hemodynamically  stable.  Hemoglobin was 15.7.  As he had no further discomfort, was  ambulatory, eating well, and was thought to have obtained maximum benefit  from his hospitalization, he is to be discharged home.   DISPOSITION:  Discharged home in good condition.  There are no activity or  dietary restrictions.  He is to follow up with Dr. Garth Schlatter, cardiology  Tuesday, July 31st at 3 p.m.   Discharge medications to include:  1.  Coreg 12.5 mg p.o. b.i.d.  2.  Altace 2.5 mg p.o. daily.  3.  Aspirin 81 mg p.o. daily.  4.  Niaspan 500 mg 4 p.o.  5.  Plavix 75 mg p.o. daily.  6.  Zocor 40 mg p.o. daily.  7.  New medication Protonix 40 mg p.o. daily.            ______________________________  Corwin Levins, M.D. [facility]     JWJ/MEDQ  D:  07/27/2006  T:  07/27/2006  Job:  [id number]   cc:   Marne A. Tower, M.D. [facility]  [address] [place]  [place] [address]   Arvilla Meres, M.D. [facility]  [facility]  [address]  [place]  [place] [address]

## 2011-05-17 NOTE — Cardiovascular Report (Signed)
NAME:  Harry Payne, Harry Payne NO.:  [id number]   MEDICAL RECORD NO.:  [id number]                   PATIENT TYPE:  INP   LOCATION:  3735                                 FACILITY:  [facility]   PHYSICIAN:  Charlies Constable, M.D. 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 He was hospitalized on March 13, 2003, with an  infarct and apparently presented late and subsequently had a PTCA and  stenting of the LAD with a Taxus stent and then he had a PCA and stent of  the circumflex artery with an Express 2 stent. He recently developed sharp  pains which were short-lived but which were quite concerning to him and he  was seen by Dr. Riley Kill in the office and sent to the hospital for admission  and for further evaluation.   DESCRIPTION OF PROCEDURE:  The procedure was performed via the left femoral  artery because the patient had a previous enrollment in the Matrix trial  when the right coronary artery was accessed, although he was randomized to  manual compression. A __________  was performed and Omnipaque contrast was  used. We used a 6 [place] preformed coronary catheters. The patient tolerated  the procedure well and left the laboratory in satisfactory condition. We  closed the left femoral artery with Perclose at the end of the procedure.   RESULTS:  1. The left main coronary artery was free of significant disease.  2. The left anterior descending artery gave rise to 2 septal perforators and     2 diagonal branches. There was less than 10% narrowing at the stent site     which was located before the first diagonal branch and which crossed the     2 septal perforators.  3. The circumflex artery gave rise to an intermediate branch, a small     marginal branch, an  atrial branch and 2 posterolateral branches. There     was 0% stenosis at the stent site in the mid vessel.  4. The right coronary artery was a moderately large vessel and gave rise to     the __________  branch, right ventricular branch, both descending     branches, and 4 posterolateral branches. There were irregularities in the     mid right coronary artery but no significant obstruction.  5. The left ventriculogram performed in the RAO projection showed an     akinesis of the anterolateral wall. The inferior wall up to the apex     moved well and the anterolateral wall near the base moved well. The     estimated ejection fraction was 50%.   The aortic pressure was 128/79 with a mean  of 99 and the left ventricular  pressure was 128/22.   CONCLUSION:  Coronary artery disease, status post prior anterior wall  myocardial infarction and prior stenting  of the LAD and circumflex arteries  with less than  10% narrowing at the stent site in the proximal LAD, 0%  stenosis at the stent site in the mid circumflex artery, mild irregularities  in the right coronary artery and anterolateral wall akinesis with an  estimated ejection fraction of 50%.    DISPOSITION:  The patient has no restenosis at any  of his stent sites. In  view of  this and the nature of his symptoms, I think that his symptoms are  not related to his cardiac problem. We will plan reassurance and discharge  him today as an outpatient with followup with Dr. Antoine Poche.                                               Charlies Constable, M.D. [facility]    BB/MEDQ  D:  04/11/2003  T:  04/11/2003  Job:  [id number]   cc:   Rollene Rotunda, M.D. [facility] A. Milinda Antis, M.D. [facility]

## 2011-05-17 NOTE — Consult Note (Signed)
NAME:  Harry Payne, Harry Payne NO.:  [id number]   MEDICAL RECORD NO.:  [id number]                   PATIENT TYPE:  INP   LOCATION:  3729                                 FACILITY:  [facility]   PHYSICIAN:  Jonelle Sidle, M.D. [facility]        DATE OF BIRTH:  25-Sep-1951   DATE OF CONSULTATION:  DATE OF DISCHARGE:                                   CONSULTATION   REASON FOR CONSULTATION:  Chest pain.   HISTORY OF PRESENT ILLNESS:  The patient is a pleasant 60 year old male with  a history of ongoing tobacco use and dyslipidemia who presents with a three  week history of postprandial chest discomfort.  He describes an intense  heaviness typically on the right side of the chest radiating across left  that has occurred typically in the evenings following a large dinner.  Symptoms occur about an hour after eating and are typically relieved with  Tums and Maalox.  He also started Protonix yesterday.  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  He lifts up to a 100 pounds at a time  and denies any symptoms of chest discomfort, dyspnea, fatigue or weakness.  He has no known history of coronary artery disease and reports a normal  exercise treadmill test about four years ago.  Today, he developed symptoms  after eating a large lunch and at the urging of his fiance presented to the  emergency department for further assessment.  We are asked to evaluate  things further.   ALLERGIES:  No known drug allergies.   CURRENT MEDICATIONS:  1. Protonix 40 mg p.o. daily (just started yesterday).  2. Aspirin 81 mg p.o. daily.  3. Lopressor 12.5 mg p.o. b.i.d.  4. Lipitor 80 mg p.o. q.h.s.  (All these medicines started today).   PAST MEDICAL HISTORY:  1. No known history of type 2 diabetes or hypertension.  2. Reported history of dyslipidemia.  3. Reported negative exercise treadmill test about four  years ago.  4. Reported history of anxiety attacks.  5. Possible gastroesophageal reflux disease.   SOCIAL HISTORY:  The patient is engaged and lives in [place].  He has a 35  pack year history of tobacco use.  Denies any alcohol or other drug use. 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   FAMILY HISTORY:  Noncontributory for premature coronary artery disease.   REVIEW OF SYSTEMS:  As per present illness. He does not report any  hematochezia or melena and denies any significant changes in his bowel  habits.  He also denies dysphasia.  He has no palpitations and has no  history of syncope.   PHYSICAL EXAMINATION:  VITAL SIGNS:  Temperature 97.0 degrees, heart rate is  71 and regular, respirations 20, blood  pressure 114/81.  Oxygen saturation  is 97% on room air.  GENERAL:  This is a well-nourished male in no acute distress.  HEENT:  Conjunctivae is normal.  Pharynx is clear.  NECK:  Supple without elevated jugular venous distention, carotid bruits.  No thyromegaly or tenderness is clearly noted.  LUNGS: Clear to auscultation with no wheezing or rhonchi.  CARDIAC:  Reveals a regular rate and rhythm without significant murmur or  gallop.  No pericardial rub is noted.  ABDOMEN:  Soft, nontender.  No hepatomegaly or bruits.  EXTREMITIES:  Exhibit no clubbing, cyanosis or edema. Peripheral pulses are  2+.  SKIN:  No ulcerative changes are noted.  MUSCULOSKELETAL:  No kyphosis is noted.  NEUROPSYCHIATRIC:  The patient is alert and oriented x3.  Affect is normal.   Chest x-ray is reported as showing no pulmonary edema or infiltrates.  There  is question of possible goiter.   A 12-lead electrogram shows normal sinus rhythm at 64 beats per minute with  Q waves in leads V1 and V2 and ST elevation in  leads V1 through V3.  This  is not clearly consistent with Brugada syndrome.  There are also T waves  inversions  in the lateral leads with other nonspecific ST-T wave changes.  No previous tracing is available for comparison.   LABORATORY DATA:  WBC 8.1, hemoglobin 16.9, platelets 261, sodium 140,  potassium 4.2, chloride 106, bicarb 34,  BUN 26, creatinine 1.2.  Glucose  90.  CK is 131, CK-MB is 3.1.  Initial Troponin I is 0.30.  INR is 0.9.   IMPRESSION:  1. Chest pain syndrome over the last three weeks in a 60 year old male with     ongoing tobacco use and reported dyslipidemia.  Symptoms frankly sound     more consistent with gastrointestinal etiology and that they are all     postprandial and seemed to be relieve with antacids and proton pump     inhibitor.  It is concerning, however, that the 12-lead electrocardiogram     is abnormal as outlined and the initial Troponin I is 0.30.  It could be     possible that he is having postprandial angina.  2. Reported history of dyslipidemia.  3. Ongoing tobacco use.  4. Previous  history of anxiety.  5. Reported history of normal exercise treadmill test about four years ago.   RECOMMENDATIONS:  1. Agree with admission to the hospital.  Would continue to cycle cardiac     markers and treat with intravenous heparin for the time being.  2. Check fasting lipid profile.  3. If cardiac enzymes remain elevated, further diagnostic evaluation will be     pursued via coronary angiography to clearly define the coronary anatomy     although symptoms are very atypical.  If his cardiac enzymes are more     reassuring suggesting an aberrant reading on initial assessment, may be     able to proceed with a Cardiolite.  4. Continue regular therapy with proton pump inhibitor.  If cardiac status     is reassuring, the patient will need further gastrointestinal evaluation.  5. Continue to follow telemetry.                                               Jonelle Sidle, M.D. [facility]    SGM/MEDQ  D:  03/12/2003  T:  03/13/2003  Job:  [id number]

## 2011-05-17 NOTE — H&P (Signed)
NAME:  Harry, Payne NO.:  [id number]   MEDICAL RECORD NO.:  [id number]          PATIENT TYPE:  INP   LOCATION:  1824                         FACILITY:  [facility]   PHYSICIAN:  Corwin Levins, M.D. LHCDATE OF BIRTH:  [birth year]   DATE OF ADMISSION:  07/26/2006  DATE OF DISCHARGE:                                HISTORY & PHYSICAL   CHIEF COMPLAINT:  Chest discomfort off and on for the past week.   HISTORY OF PRESENT ILLNESS:  Harry Payne is a 60 year old black male with  one week-history of substernal chest discomfort, dull midchest without  radiation, somewhat worse with breath he says and somewhat better with  exertion.  Associated one time with shortness of breath this morning as well  as nausea and other times without.  There has certainly been no diaphoresis,  palpitations or syncope.  He has had no dizziness, though he is very worried  about his heart rate of 55 now that he sees it on the monitor in the ER.  Does have known coronary artery disease, status post MI and stent x2.  He  has had several episodes of pain this week lasting more than two hours.   PAST MEDICAL HISTORY:  1.  Coronary artery disease, status post MI with stent to the LAD and the      PCI to the circumflex in 2004.  2.  Stress Cardiolite negative in December 2005.  3.  Nonsustained VT.  4.  GERD, stopped Protonix over one year ago.  5.  Hypercholesterolemia.  6.  Anxiety.  7.  Insomnia.  8.  History of pneumonia in 2004.   ALLERGIES:  No known drug allergies.   CURRENT MEDICATIONS:  1.  Coreg 12.5 mg b.i.d.  2.  Altace 2.5 mg p.o. daily.  3.  Aspirin 81 mg p.o. daily.  4.  Niaspan 500 mg 4 p.o. daily.  5.  Plavix 75 mg 1 p.o. daily.  6.  Zocor 40 mg p.o. daily.   SOCIAL HISTORY:  No tobacco for three years.  Alcohol:  None.  Married and  lives with the wife in [place].   FAMILY HISTORY:  Noncontributory.   REVIEW OF SYSTEMS:  Otherwise noncontributory.   PHYSICAL  EXAMINATION:  VITAL SIGNS:  Afebrile, blood pressure 132/80, heart  rate of 102 initially and then 55 on the monitor.  During the exam,  respirations 20, O2 saturation 95%.  HEENT:  Sclerae are clear.  TMs are clear.  Throat is benign.  NECK:  No lymphadenopathy, JVD, thyromegaly.  CHEST:  No rales or wheezing.  CARDIAC:  Regular rate and rhythm.  ABDOMEN:  Soft, nontender.  Positive bowel sounds.  No organomegaly and no  masses.  EXTREMITIES:  No edema.   LABORATORY DATA:  ECG sinus bradycardia, heart rate of 59, no acute changes.  CPK-MB 1.4, troponin I less than 0.5.  Hemoglobin 16.0 by I-stat with normal  electrolytes.  BUN 22, creatinine 1.0, glucose 83.  Hemoglobin by house lab  15.7, white blood cell count 8.9.  Chest x-ray no acute disease.   ASSESSMENT/PLAN:  1.  Chest pain, atypical:  To be admitted by telemetry.  O2 two liters.      Rule out myocardial infarction with serial cardiac enzymes.  Continue      aspirin, beta blocker and statin as is.  Cardiology to be consulted on      July 27, 2006 as he may need consideration for stress Myoview as an      inpatient prior to discharge if cardiac enzymes are negative.  Also      restart the proton pump inhibitor today.  2.  Other medical problems:  Otherwise continue home medications.           ______________________________  Corwin Levins, M.D. [facility]     JWJ/MEDQ  D:  07/26/2006  T:  07/26/2006  Job:  [id number]   cc:   Marne A. Tower, M.D. [facility]  [address] [place]  [place] [address]   Arvilla Meres, M.D. [facility]  [facility]  [address]  [place]  [place] [address]

## 2011-05-31 LAB — HM DIABETES EYE EXAM: HM Diabetic Eye Exam: NORMAL

## 2011-07-15 ENCOUNTER — Telehealth: Payer: Self-pay | Admitting: Internal Medicine

## 2011-07-15 NOTE — Telephone Encounter (Signed)
Left message for pt to call back if he is having problems however the 12/23/11 appt is the first available for Dr Gala Romney.  Will forward to MD for review.

## 2011-07-15 NOTE — Telephone Encounter (Signed)
Pt just received letter to make 6 month fu appt with bensimhon, he's booked until 12-24, pt wants to know if he should wait that long?

## 2011-07-19 NOTE — Telephone Encounter (Signed)
OK to wait unless he is having symptoms. May be worthwhile to tell him that I am transitioning to HF clinic and that my availability will be limited. Can transfer to someone else if he wants but if wants to stay I will see him later in the year or sooner if an opening comes up.

## 2011-08-02 ENCOUNTER — Other Ambulatory Visit: Payer: Self-pay | Admitting: Family Medicine

## 2011-08-02 NOTE — Telephone Encounter (Signed)
[facility] request refill for Protonix 40mg . #30 x 3 given. Pt said he was getting the generic protonix and insurance would pay for that and Cardiologist said that would be OK.

## 2011-08-30 NOTE — Telephone Encounter (Signed)
Harry Payne he is scheduled for 12/24 can you let him know what Dr Gala Romney said below, and he can either keep appt or change to different MD, thanks

## 2011-09-30 ENCOUNTER — Other Ambulatory Visit: Payer: Self-pay | Admitting: *Deleted

## 2011-09-30 ENCOUNTER — Other Ambulatory Visit: Payer: BC Managed Care – PPO

## 2011-09-30 MED ORDER — RAMIPRIL 5 MG PO CAPS: 5.0000 mg | ORAL_CAPSULE | Freq: Every day | ORAL | Status: DC

## 2011-09-30 NOTE — Telephone Encounter (Signed)
Received faxed refill request from pharmacy. Refill sent to pharmacy electronically. 

## 2011-10-07 ENCOUNTER — Ambulatory Visit (INDEPENDENT_AMBULATORY_CARE_PROVIDER_SITE_OTHER): Payer: BC Managed Care – PPO | Admitting: Family Medicine

## 2011-10-07 ENCOUNTER — Encounter: Payer: Self-pay | Admitting: Family Medicine

## 2011-10-07 DIAGNOSIS — E119 Type 2 diabetes mellitus without complications: Secondary | ICD-10-CM

## 2011-10-07 DIAGNOSIS — E785 Hyperlipidemia, unspecified: Secondary | ICD-10-CM

## 2011-10-07 DIAGNOSIS — K219 Gastro-esophageal reflux disease without esophagitis: Secondary | ICD-10-CM

## 2011-10-07 DIAGNOSIS — I2510 Atherosclerotic heart disease of native coronary artery without angina pectoris: Secondary | ICD-10-CM

## 2011-10-07 LAB — COMPREHENSIVE METABOLIC PANEL
Albumin: 4 g/dL (ref 3.5–5.2)
Alkaline Phosphatase: 57 U/L (ref 39–117)
Glucose, Bld: 156 mg/dL — ABNORMAL HIGH (ref 70–99)
Potassium: 4.4 mEq/L (ref 3.5–5.1)
Sodium: 139 mEq/L (ref 135–145)
Total Protein: 7.1 g/dL (ref 6.0–8.3)

## 2011-10-07 LAB — CBC WITH DIFFERENTIAL/PLATELET
Eosinophils Relative: 1.8 % (ref 0.0–5.0)
Lymphocytes Relative: 29.3 % (ref 12.0–46.0)
MCV: 96 fl (ref 78.0–100.0)
Monocytes Absolute: 0.6 10*3/uL (ref 0.1–1.0)
Monocytes Relative: 8.2 % (ref 3.0–12.0)
Neutrophils Relative %: 60.2 % (ref 43.0–77.0)
Platelets: 256 10*3/uL (ref 150.0–400.0)
WBC: 6.8 10*3/uL (ref 4.5–10.5)

## 2011-10-07 LAB — LIPID PANEL
Cholesterol: 102 mg/dL (ref 0–200)
LDL Cholesterol: 49 mg/dL (ref 0–99)
Total CHOL/HDL Ratio: 3

## 2011-10-07 MED ORDER — METFORMIN HCL 500 MG PO TABS: 500.0000 mg | ORAL_TABLET | Freq: Two times a day (BID) | ORAL | Status: DC

## 2011-10-07 MED ORDER — PANTOPRAZOLE SODIUM 40 MG PO TBEC: 40.0000 mg | DELAYED_RELEASE_TABLET | Freq: Every day | ORAL | Status: DC

## 2011-10-07 MED ORDER — NIACIN ER (ANTIHYPERLIPIDEMIC) 1000 MG PO TBCR: EXTENDED_RELEASE_TABLET | ORAL | Status: DC

## 2011-10-07 MED ORDER — ROSUVASTATIN CALCIUM 40 MG PO TABS: 40.0000 mg | ORAL_TABLET | Freq: Every day | ORAL | Status: DC

## 2011-10-07 MED ORDER — RAMIPRIL 5 MG PO CAPS: 5.0000 mg | ORAL_CAPSULE | Freq: Every day | ORAL | Status: DC

## 2011-10-07 NOTE — Assessment & Plan Note (Signed)
Pt is on aggressive regimen of crestor and niaspan Lab today Rev low sat fat diet -= doing well with that  Has been well controlled for CAD

## 2011-10-07 NOTE — Assessment & Plan Note (Signed)
No new events - pt continues cardiol f/u On plavix and coreg and aggressive chol control  Thinks the coreg makes him sluggish - but will continue it - watching for hypotension

## 2011-10-07 NOTE — Assessment & Plan Note (Signed)
Does well on protonix when his ins pays for it - avoids other PPIs due to being on plavix Enc him to call several pharmacies and price compare since it is generic  Disc need for wt loss also

## 2011-10-07 NOTE — Patient Instructions (Signed)
Think about getting the flu shot - I think it is important  Keep up the good exercise  No change in medicines  Labs today

## 2011-10-07 NOTE — Assessment & Plan Note (Signed)
Per pt sugars are generally stable  Declines imms for no good reason- counseled him on that - asked him to think about it  Rev DM diet- Getting fairly good exercise  opthy utd On ace  Lab today and then decide f/u

## 2011-10-07 NOTE — Progress Notes (Signed)
Subjective:    Patient ID: Harry Payne, male    DOB: 10-14-1951, 60 y.o.   MRN: [id number]  HPI Here for f/u of DM and hyperlipidemia  Is generally tired -- and a little nauseated in ams- this is from his med but he is getting used to it  Has to be on coreg and it slows him down   Wt is up 2 lb with bmi of 32  DM last a1c was 6.7 in march on metformin  Sugars are good overall - usually 100-105 in the ams  opthy- June and no retinopathy  He is working to achieve low glycemic diet- but it is difficult  No excesive thirst or urination  No numbness No change in vision  Working on staying active -   GERD -protonix (also on plavix) Problems getting it paid for -- it does pay for 3 mo out of the year  He does get significant heartburn when he does not take it / perhaps cough Aware wt loss would help Has to watch what he eats   Flu shot - does not get them  Is afraid of needles -- and declines it  Could not convince him otherwise   Lipids- on crestor and niaspan currently  Is still on both  Is eating a healthy diet for the most part- low sugar and fat  No cp or sob  No muscle pain or side eff from the meds   Is getting some exercise - 3-4 days per week   Patient Active Problem List  Diagnoses  . DIABETES-TYPE 2  . B12 DEFICIENCY  . HYPERLIPIDEMIA  . ANXIETY  . MYOCARDIAL INFARCTION, HX OF  . CORONARY ARTERY DISEASE  . HYPOTENSION  . GERD  . INSOMNIA  . COLONIC POLYPS, ADENOMATOUS, HX OF  . TOBACCO USE, QUIT   Past Medical History  Diagnosis Date  . Anxiety   . CAD (coronary artery disease)   . GERD (gastroesophageal reflux disease)   . Other and unspecified hyperlipidemia   . Depression   . Hypotension   . Hx of adenomatous colonic polyps   . Hemorrhoids    Past Surgical History  Procedure Date  . Closed reduction hand fracture 1990    left hand   History  Substance Use Topics  . Smoking status: Former Smoker    Quit date: 12/31/2003  . Smokeless  tobacco: Not on file  . Alcohol Use: No   Family History  Problem Relation Age of Onset  . Prostate cancer Father   . Diabetes Brother   . Diabetes Brother    No Active Allergies Current Outpatient Prescriptions on File Prior to Visit  Medication Sig Dispense Refill  . aspirin 81 MG tablet Take 81 mg by mouth 2 (two) times daily.        . carvedilol (COREG) 25 MG tablet Take 25 mg by mouth 2 (two) times daily with meals.        . clopidogrel (PLAVIX) 75 MG tablet Take 1 tablet (75 mg total) by mouth daily.  30 tablet  6  . glucose blood (ONE TOUCH TEST STRIPS) test strip Use as instructed, once daily       . vitamin B-12 (CYANOCOBALAMIN) 1000 MCG tablet Take 1,000 mcg by mouth daily.                 Review of Systems Review of Systems  Constitutional: Negative for fever, appetite change,  and unexpected weight change. pos for fatigue  Eyes: Negative for pain and visual disturbance.  Respiratory: Negative for cough and shortness of breath.   Cardiovascular: Negative for cp or palpitations    Gastrointestinal: Negative for nausea, diarrhea and constipation.  Genitourinary: Negative for urgency and frequency.  Skin: Negative for pallor or rash   Neurological: Negative for weakness, light-headedness, numbness and headaches.  Hematological: Negative for adenopathy. Does not bruise/bleed easily.  Psychiatric/Behavioral: Negative for dysphoric mood. The patient is not nervous/anxious.          Objective:   Physical Exam  Constitutional: He appears well-developed and well-nourished. No distress.       overwt and well appearing   HENT:  Head: Normocephalic and atraumatic.  Mouth/Throat: Oropharynx is clear and moist.  Eyes: Conjunctivae and EOM are normal. Pupils are equal, round, and reactive to light.  Neck: Normal range of motion. Neck supple. No JVD present. Carotid bruit is not present. No thyromegaly present.  Cardiovascular: Normal rate, regular rhythm, normal heart  sounds and intact distal pulses.  Exam reveals no gallop.   No murmur heard. Pulmonary/Chest: Effort normal and breath sounds normal. No respiratory distress. He has no wheezes.       No crackles   Abdominal: Soft. Bowel sounds are normal. He exhibits no distension, no abdominal bruit and no mass. There is no tenderness.  Musculoskeletal: Normal range of motion. He exhibits no edema and no tenderness.  Lymphadenopathy:    He has no cervical adenopathy.  Neurological: He is alert. He has normal reflexes. No cranial nerve deficit. He exhibits normal muscle tone. Coordination normal.  Skin: Skin is warm and dry. No rash noted. No erythema. No pallor.  Psychiatric: He has a normal mood and affect.          Assessment & Plan:

## 2011-10-10 ENCOUNTER — Other Ambulatory Visit: Payer: Self-pay

## 2011-10-10 MED ORDER — NITROGLYCERIN 0.4 MG SL SUBL: 0.4000 mg | SUBLINGUAL_TABLET | SUBLINGUAL | Status: DC | PRN

## 2011-10-10 NOTE — Telephone Encounter (Signed)
[facility] faxed refill request for Nitrostat 0.4 mg #25 x 3. Unable to reach pt so Jacki Cones advised to refill.

## 2011-11-24 ENCOUNTER — Other Ambulatory Visit: Payer: Self-pay | Admitting: Family Medicine

## 2011-12-23 ENCOUNTER — Ambulatory Visit (INDEPENDENT_AMBULATORY_CARE_PROVIDER_SITE_OTHER): Payer: BC Managed Care – PPO | Admitting: Internal Medicine

## 2011-12-23 ENCOUNTER — Encounter: Payer: Self-pay | Admitting: Internal Medicine

## 2011-12-23 VITALS — BP 100/70 | HR 66 | Ht 71.0 in | Wt 231.0 lb

## 2011-12-23 DIAGNOSIS — R5381 Other malaise: Secondary | ICD-10-CM

## 2011-12-23 DIAGNOSIS — I2510 Atherosclerotic heart disease of native coronary artery without angina pectoris: Secondary | ICD-10-CM

## 2011-12-23 DIAGNOSIS — R5383 Other fatigue: Secondary | ICD-10-CM

## 2011-12-23 DIAGNOSIS — E785 Hyperlipidemia, unspecified: Secondary | ICD-10-CM

## 2011-12-23 NOTE — Assessment & Plan Note (Addendum)
Goal LDL < 70. Recent lipids reviewed. Look good except for HDL 34. Will add fish oil 2g/day.

## 2011-12-23 NOTE — Assessment & Plan Note (Signed)
Suspect this may be related to his carvedilol. Will cut back to 12.5 bid.

## 2011-12-23 NOTE — Patient Instructions (Signed)
Your physician recommends that you schedule a follow-up appointment in the [facility] in 6 months with Dr. Gala Romney.  Your physician has recommended you make the following change in your medication: Coreg 12.5mg  twice daily. Start taking over-the-counter Fish oil 2gm every day.

## 2011-12-23 NOTE — Progress Notes (Signed)
HPI:  Harry Payne is a 60 yo male with CAD s/p anterior MI 2004 (s/p stenting LAD and LCX), DM2, and hyperlipidemia (previously in AIM-HIGH trial). EF 48% by MR. Myoview 2009 EF 43%. anterior apical scar. no ischemia.   He presented to [facility] 02/16/11 with chest pain.  He ruled out for myocardial infarction.  Cardiac catheterization done 02/18/2011: Proximal LAD stent patent; midcircumflex 25%, circumflex stent patent; RCA 25% of the PDA, proximal PDA 25%; posterior lateral 25-30%; EF 50% with anterior dyskinesis.  Given the non-obstructive nature of his anatomy, it was felt that his chest pain was noncardiac.   Since discharge, he has been doing OK. No CP. However he says that by 3pm every day he is just wiped out. Denies snoring. Occasional SOB. Every Saturday walking 4 miles at gym on [place] @ 4.0 mph. Too tired to walk during the day after work. Very active at work. No HF symptoms. SBP runs about 100.   ROS: All systems negative except as listed in HPI, PMH and Problem List.  Past Medical History  Diagnosis Date  . Anxiety   . CAD (coronary artery disease)   . GERD (gastroesophageal reflux disease)   . Other and unspecified hyperlipidemia   . Depression   . Hypotension   . Hx of adenomatous colonic polyps   . Hemorrhoids     Current Outpatient Prescriptions  Medication Sig Dispense Refill  . aspirin 81 MG tablet Take 81 mg by mouth 2 (two) times daily.        . carvedilol (COREG) 25 MG tablet Take 25 mg by mouth 2 (two) times daily with meals.        . clopidogrel (PLAVIX) 75 MG tablet Take 1 tablet (75 mg total) by mouth daily.  30 tablet  6  . glucose blood (ONE TOUCH TEST STRIPS) test strip Use as instructed, once daily       . metFORMIN (GLUCOPHAGE) 500 MG tablet Take 1 tablet (500 mg total) by mouth 2 (two) times daily with a meal.  60 tablet  11  . nitroGLYCERIN (NITROSTAT) 0.4 MG SL tablet Place 1 tablet (0.4 mg total) under the tongue every 5 (five) minutes as  needed.  25 tablet  3  . pantoprazole (PROTONIX) 40 MG tablet Take 1 tablet (40 mg total) by mouth daily.  30 tablet  11  . ramipril (ALTACE) 5 MG capsule Take 1 capsule (5 mg total) by mouth daily.  30 capsule  11  . rosuvastatin (CRESTOR) 40 MG tablet Take 1 tablet (40 mg total) by mouth daily.  30 tablet  11  . vitamin B-12 (CYANOCOBALAMIN) 1000 MCG tablet Take 1,000 mcg by mouth daily.           PHYSICAL EXAM: Filed Vitals:   12/23/11 1106  BP: 100/70  Pulse: 66   General:  Well appearing. No resp difficulty HEENT: normal Neck: supple. JVP flat. Carotids 2+ bilaterally; no bruits. No lymphadenopathy or thryomegaly appreciated. Cor: PMI normal. Regular rate & rhythm. No rubs, gallops or murmurs. Lungs: clear Abdomen: soft, nontender, nondistended. No hepatosplenomegaly. No bruits or masses. Good bowel sounds. Extremities: no cyanosis, clubbing, rash, edema Neuro: alert & orientedx3, cranial nerves grossly intact. Moves all 4 extremities w/o difficulty. Affect pleasant.    ECG: NSR 66.  Anteroseptal qs (old) No ST-T wave abnormalities.     ASSESSMENT & PLAN:

## 2011-12-23 NOTE — Assessment & Plan Note (Signed)
No evidence of ischemia. Continue current regimen.   

## 2011-12-26 ENCOUNTER — Other Ambulatory Visit: Payer: Self-pay | Admitting: Internal Medicine

## 2011-12-26 MED ORDER — CLOPIDOGREL BISULFATE 75 MG PO TABS: 75.0000 mg | ORAL_TABLET | Freq: Every day | ORAL | Status: DC

## 2012-01-10 ENCOUNTER — Other Ambulatory Visit: Payer: BC Managed Care – PPO

## 2012-01-15 ENCOUNTER — Ambulatory Visit: Payer: BC Managed Care – PPO | Admitting: Family Medicine

## 2012-01-22 ENCOUNTER — Other Ambulatory Visit: Payer: Self-pay | Admitting: Internal Medicine

## 2012-03-16 ENCOUNTER — Ambulatory Visit: Payer: BC Managed Care – PPO | Admitting: Family Medicine

## 2012-03-25 ENCOUNTER — Ambulatory Visit (INDEPENDENT_AMBULATORY_CARE_PROVIDER_SITE_OTHER): Payer: BC Managed Care – PPO | Admitting: Family Medicine

## 2012-03-25 ENCOUNTER — Encounter: Payer: Self-pay | Admitting: Family Medicine

## 2012-03-25 VITALS — BP 90/62 | HR 67 | Temp 97.6°F | Ht 71.0 in | Wt 227.2 lb

## 2012-03-25 DIAGNOSIS — E785 Hyperlipidemia, unspecified: Secondary | ICD-10-CM

## 2012-03-25 DIAGNOSIS — E663 Overweight: Secondary | ICD-10-CM | POA: Insufficient documentation

## 2012-03-25 DIAGNOSIS — E669 Obesity, unspecified: Secondary | ICD-10-CM

## 2012-03-25 DIAGNOSIS — E119 Type 2 diabetes mellitus without complications: Secondary | ICD-10-CM

## 2012-03-25 LAB — LIPID PANEL
HDL: 32.1 mg/dL — ABNORMAL LOW (ref >39.00)
LDL Cholesterol: 42 mg/dL (ref 0–99)
Total CHOL/HDL Ratio: 3
Triglycerides: 138 mg/dL (ref 0.0–149.0)
VLDL: 27.6 mg/dL (ref 0.0–40.0)

## 2012-03-25 LAB — COMPREHENSIVE METABOLIC PANEL
ALT: 34 U/L (ref 0–53)
AST: 25 U/L (ref 0–37)
Alkaline Phosphatase: 62 U/L (ref 39–117)
BUN: 16 mg/dL (ref 6–23)
Calcium: 9.4 mg/dL (ref 8.4–10.5)
Chloride: 100 mEq/L (ref 96–112)
Creatinine, Ser: 0.9 mg/dL (ref 0.4–1.5)
Total Bilirubin: 0.7 mg/dL (ref 0.3–1.2)

## 2012-03-25 LAB — HEMOGLOBIN A1C: Hgb A1c MFr Bld: 9.7 % — ABNORMAL HIGH (ref 4.6–6.5)

## 2012-03-25 MED ORDER — RANITIDINE HCL 150 MG PO TABS: 150.0000 mg | ORAL_TABLET | Freq: Two times a day (BID) | ORAL | Status: DC

## 2012-03-25 NOTE — Progress Notes (Signed)
Subjective:    Patient ID: Harry Payne, male    DOB: 1951/08/24, 61 y.o.   MRN: [id number]  HPI Here for f/u of chronic conditions Is doing well  Nothing new going on  Just does not have a lot of energy anymore   Schedule is unreal No time to exercise  Cut his coreg back for this  Diabetes Home sugar results - per pt are good , no major highs or lows- did not bring record  DM diet  Exercise -not a lot lately- last few months have been hectic Symptoms-- none- feels ok  A1C last was 7.0 No problems with medications on metformin bid  Renal protection is on ace  Last eye exam -- was about a year ago- needs to set it up , vision fine  Wt is down 4 lb with bmi of 31   Lab Results  Component Value Date   CHOL 102 10/07/2011   CHOL 112 03/18/2011   CHOL  Value: 87        ATP III CLASSIFICATION:  <200     mg/dL   Desirable  147-829  mg/dL   Borderline High  >=562    mg/dL   High        01/29/8656   Lab Results  Component Value Date   HDL 34.20* 10/07/2011   HDL 30.20* 03/18/2011   HDL 25* 02/17/2011   Lab Results  Component Value Date   LDLCALC 49 10/07/2011   LDLCALC 65 03/18/2011   LDLCALC  Value: 39        Total Cholesterol/HDL:CHD Risk Coronary Heart Disease Risk Table                     Men   Women  1/2 Average Risk   3.4   3.3  Average Risk       5.0   4.4  2 X Average Risk   9.6   7.1  3 X Average Risk  23.4   11.0        Use the calculated Patient Ratio above and the CHD Risk Table to determine the patient's CHD Risk.        ATP III CLASSIFICATION (LDL):  <100     mg/dL   Optimal  846-962  mg/dL   Near or Above                    Optimal  130-159  mg/dL   Borderline  952-841  mg/dL   High  >324     mg/dL   Very High 03/31/271   Lab Results  Component Value Date   TRIG 93.0 10/07/2011   TRIG 85.0 03/18/2011   TRIG 115 02/17/2011   Lab Results  Component Value Date   CHOLHDL 3 10/07/2011   CHOLHDL 4 03/18/2011   CHOLHDL 3.5 02/17/2011   No results found for this basename:  LDLDIRECT   overall controlled on crestor and diet  Diet is fair - pretty good overall , but does slip   Declines most imms   Patient Active Problem List  Diagnoses  . DIABETES-TYPE 2  . B12 DEFICIENCY  . HYPERLIPIDEMIA  . ANXIETY  . MYOCARDIAL INFARCTION, HX OF  . CORONARY ARTERY DISEASE  . HYPOTENSION  . GERD  . INSOMNIA  . COLONIC POLYPS, ADENOMATOUS, HX OF  . TOBACCO USE, QUIT  . Fatigue  . Obesity   Past Medical History  Diagnosis Date  . Anxiety   .  CAD (coronary artery disease)   . GERD (gastroesophageal reflux disease)   . Other and unspecified hyperlipidemia   . Depression   . Hypotension   . Hx of adenomatous colonic polyps   . Hemorrhoids    Past Surgical History  Procedure Date  . Closed reduction hand fracture 1990    left hand   History  Substance Use Topics  . Smoking status: Former Smoker    Quit date: 12/31/2003  . Smokeless tobacco: Not on file  . Alcohol Use: No   Family History  Problem Relation Age of Onset  . Prostate cancer Father   . Diabetes Brother   . Diabetes Brother    No Known Allergies Current Outpatient Prescriptions on File Prior to Visit  Medication Sig Dispense Refill  . aspirin 81 MG tablet Take 81 mg by mouth 2 (two) times daily.        . clopidogrel (PLAVIX) 75 MG tablet Take 1 tablet (75 mg total) by mouth daily.  30 tablet  6  . glucose blood (ONE TOUCH TEST STRIPS) test strip Use as instructed, once daily       . metFORMIN (GLUCOPHAGE) 500 MG tablet Take 1 tablet (500 mg total) by mouth 2 (two) times daily with a meal.  60 tablet  11  . nitroGLYCERIN (NITROSTAT) 0.4 MG SL tablet Place 1 tablet (0.4 mg total) under the tongue every 5 (five) minutes as needed.  25 tablet  3  . ramipril (ALTACE) 5 MG capsule Take 1 capsule (5 mg total) by mouth daily.  30 capsule  11  . rosuvastatin (CRESTOR) 40 MG tablet Take 1 tablet (40 mg total) by mouth daily.  30 tablet  11  . vitamin B-12 (CYANOCOBALAMIN) 1000 MCG tablet Take  1,000 mcg by mouth daily.        Marland Kitchen DISCONTD: pantoprazole (PROTONIX) 40 MG tablet Take 1 tablet (40 mg total) by mouth daily.  30 tablet  11        Review of Systems Review of Systems  Constitutional: Negative for fever, appetite change, and unexpected weight change. pos for fatigue (from work schedule) Eyes: Negative for pain and visual disturbance.  Respiratory: Negative for cough and shortness of breath.   Cardiovascular: Negative for cp or palpitations    Gastrointestinal: Negative for nausea, diarrhea and constipation.  Genitourinary: Negative for urgency and frequency. neg for excessive thirst  Skin: Negative for pallor or rash   Neurological: Negative for weakness, light-headedness, numbness and headaches.  Hematological: Negative for adenopathy. Does not bruise/bleed easily.  Psychiatric/Behavioral: Negative for dysphoric mood. The patient is not nervous/anxious.          Objective:   Physical Exam  Constitutional: He appears well-developed and well-nourished. No distress.       Marland Kitchenobese and well appearing   HENT:  Head: Normocephalic and atraumatic.  Mouth/Throat: Oropharynx is clear and moist.  Eyes: Conjunctivae and EOM are normal. Pupils are equal, round, and reactive to light. No scleral icterus.  Neck: Normal range of motion. Neck supple. No JVD present. Carotid bruit is not present. No thyromegaly present.  Cardiovascular: Normal rate, regular rhythm, normal heart sounds and intact distal pulses.  Exam reveals no gallop.   Pulmonary/Chest: Effort normal and breath sounds normal. No respiratory distress. He has no wheezes.  Abdominal: Soft. Bowel sounds are normal. He exhibits no distension, no abdominal bruit and no mass. There is no tenderness.  Musculoskeletal: Normal range of motion. He exhibits no edema and no  tenderness.  Lymphadenopathy:    He has no cervical adenopathy.  Neurological: He is alert. He has normal reflexes. No cranial nerve deficit. He exhibits  normal muscle tone. Coordination normal.  Skin: Skin is warm and dry. No rash noted. No erythema. No pallor.  Psychiatric: He has a normal mood and affect.          Assessment & Plan:

## 2012-03-25 NOTE — Assessment & Plan Note (Signed)
Discussed how this problem influences overall health and the risks it imposes  Reviewed plan for weight loss with lower calorie diet (via better food choices and also portion control or program like weight watchers) and exercise building up to or more than 30 minutes 5 days per week including some aerobic activity   Pt does not have time to exercise- urged him to work on finding time  Commended wt loss so far

## 2012-03-25 NOTE — Assessment & Plan Note (Signed)
Check today Has been well controlled with crestor and diet Rev low sat fat diet Disc goals for lipids and reasons to control them Rev labs with pt- from fall Rev low sat fat diet in detail

## 2012-03-25 NOTE — Assessment & Plan Note (Signed)
a1c today  Urged to keep up the good work with diet and add exercise when able Commended wt loss Ref for yearly DM eye exam On ace  F/u 6 mo if stable  Declines imms

## 2012-03-25 NOTE — Patient Instructions (Signed)
Keep working on [employer] in exercise when you can We will do eye doctor referral when you check out  Labs today  Follow up for annual exam in 6 months with labs prior

## 2012-03-31 ENCOUNTER — Other Ambulatory Visit: Payer: Self-pay

## 2012-03-31 NOTE — Telephone Encounter (Signed)
Pt left v/m needs refill on one touch ultra test strips. Pt did not note what pharmacy wanted sent to. Left v/m for pt to call back.

## 2012-03-31 NOTE — Telephone Encounter (Signed)
Patient returning call to [facility] and needing test strips sent to [facility] in [place] on [address].

## 2012-04-01 MED ORDER — GLUCOSE BLOOD VI STRP: ORAL_STRIP | Status: DC

## 2012-04-01 NOTE — Telephone Encounter (Signed)
One touch test strips #100 x 3 sent to [facility].Patient notified as instructed by telephone v/m that refill was done.

## 2012-04-21 ENCOUNTER — Ambulatory Visit (INDEPENDENT_AMBULATORY_CARE_PROVIDER_SITE_OTHER): Payer: BC Managed Care – PPO | Admitting: Family Medicine

## 2012-04-21 ENCOUNTER — Encounter: Payer: Self-pay | Admitting: Family Medicine

## 2012-04-21 VITALS — BP 104/62 | HR 70 | Temp 98.7°F | Ht 71.0 in | Wt 220.0 lb

## 2012-04-21 DIAGNOSIS — E119 Type 2 diabetes mellitus without complications: Secondary | ICD-10-CM

## 2012-04-21 NOTE — Patient Instructions (Signed)
I'm glad you are doing much better with diabetes  Keep up the good work with healthy diet and exercise and weight loss  Schedule follow up in 3 months with labs prior

## 2012-04-21 NOTE — Progress Notes (Signed)
Subjective:    Patient ID: Harry Payne, male    DOB: 24-Jul-1951, 61 y.o.   MRN: [id number]  HPI Here for f/u of DM  Had a good eye exam today - no retinopathy- still dilated and wearing sunglasses   Sugar 295 and a1c 9.7  Was not aware of this   In ams now - average is 124  2 hours pp - after exercise 83-100 Afternoons - eats and then walks 2.5 miles   2 h pp - has checked occas about 130   This is all from drastic diet and exercise change  Lost 10 lb by his scales  8lb by our scales   Diet- much smaller portions and quit bread , and no sweets as a rule (was eating them)  Exercise 2.5 mile walk at least 4 days per week  Is on feet all day at work  Feels better  Getting into better habits   Patient Active Problem List  Diagnoses  . DIABETES-TYPE 2  . B12 DEFICIENCY  . HYPERLIPIDEMIA  . ANXIETY  . MYOCARDIAL INFARCTION, HX OF  . CORONARY ARTERY DISEASE  . HYPOTENSION  . GERD  . INSOMNIA  . COLONIC POLYPS, ADENOMATOUS, HX OF  . TOBACCO USE, QUIT  . Fatigue  . Obesity   Past Medical History  Diagnosis Date  . Anxiety   . CAD (coronary artery disease)   . GERD (gastroesophageal reflux disease)   . Other and unspecified hyperlipidemia   . Depression   . Hypotension   . Hx of adenomatous colonic polyps   . Hemorrhoids    Past Surgical History  Procedure Date  . Closed reduction hand fracture 1990    left hand   History  Substance Use Topics  . Smoking status: Former Smoker    Quit date: 12/31/2003  . Smokeless tobacco: Not on file  . Alcohol Use: No   Family History  Problem Relation Age of Onset  . Prostate cancer Father   . Diabetes Brother   . Diabetes Brother    No Known Allergies Current Outpatient Prescriptions on File Prior to Visit  Medication Sig Dispense Refill  . aspirin 81 MG tablet Take 81 mg by mouth 2 (two) times daily.        . carvedilol (COREG) 25 MG tablet       . clopidogrel (PLAVIX) 75 MG tablet Take 1 tablet (75 mg total)  by mouth daily.  30 tablet  6  . glucose blood (ONE TOUCH TEST STRIPS) test strip Use as instructed, once daily  100 each  3  . metFORMIN (GLUCOPHAGE) 500 MG tablet Take 1 tablet (500 mg total) by mouth 2 (two) times daily with a meal.  60 tablet  11  . nitroGLYCERIN (NITROSTAT) 0.4 MG SL tablet Place 1 tablet (0.4 mg total) under the tongue every 5 (five) minutes as needed.  25 tablet  3  . ramipril (ALTACE) 5 MG capsule Take 1 capsule (5 mg total) by mouth daily.  30 capsule  11  . ranitidine (ZANTAC) 150 MG tablet Take 1 tablet (150 mg total) by mouth 2 (two) times daily.  120 tablet  5  . rosuvastatin (CRESTOR) 40 MG tablet Take 1 tablet (40 mg total) by mouth daily.  30 tablet  11  . vitamin B-12 (CYANOCOBALAMIN) 1000 MCG tablet Take 1,000 mcg by mouth daily.        Marland Kitchen DISCONTD: pantoprazole (PROTONIX) 40 MG tablet Take 1 tablet (40 mg total) by  mouth daily.  30 tablet  11       Review of Systems Review of Systems  Constitutional: Negative for fever, appetite change, fatigue and unexpected weight change.  Eyes: Negative for pain and visual disturbance.  Respiratory: Negative for cough and shortness of breath.   Cardiovascular: Negative for cp or palpitations    Gastrointestinal: Negative for nausea, diarrhea and constipation.  Genitourinary: Negative for urgency and frequency.  Skin: Negative for pallor or rash   Neurological: Negative for weakness, light-headedness, numbness and headaches.  Hematological: Negative for adenopathy. Does not bruise/bleed easily.  Psychiatric/Behavioral: Negative for dysphoric mood. The patient is not nervous/anxious.          Objective:   Physical Exam  Constitutional: He appears well-developed and well-nourished. No distress.  HENT:  Head: Normocephalic and atraumatic.  Mouth/Throat: Oropharynx is clear and moist.  Eyes: Conjunctivae and EOM are normal. Pupils are equal, round, and reactive to light.  Neck: Normal range of motion. Neck supple.  No JVD present. Carotid bruit is not present. Erythema present. No thyromegaly present.  Cardiovascular: Normal rate, regular rhythm and normal heart sounds.   Pulmonary/Chest: Effort normal and breath sounds normal. No respiratory distress. He has no wheezes.  Abdominal: He exhibits no abdominal bruit.  Musculoskeletal: He exhibits no edema.  Lymphadenopathy:    He has no cervical adenopathy.  Neurological: He is alert. He has normal reflexes. No cranial nerve deficit. He exhibits normal muscle tone. Coordination normal.  Skin: Skin is warm and dry. No rash noted.  Psychiatric: He has a normal mood and affect.          Assessment & Plan:

## 2012-04-21 NOTE — Assessment & Plan Note (Signed)
With last labs worse - a1c over 9 and high sugars  Since then with excellent diet and exercise and 10 lb wt loss - home sugars seem to be at goal  Rev habits and home sugar log in detail a1c and f/u in 3 months  Commended the good effort

## 2012-05-30 LAB — HM DIABETES EYE EXAM: HM Diabetic Eye Exam: NORMAL

## 2012-07-17 ENCOUNTER — Telehealth: Payer: Self-pay | Admitting: Family Medicine

## 2012-07-17 NOTE — Telephone Encounter (Signed)
Correct-- believe it or not A1C does NOT require fasting -thanks

## 2012-07-17 NOTE — Telephone Encounter (Signed)
Pt left v/m confused about appt scheduled 07/20/12 and pt said could leave message on [phone]. I called [phone] left v/m pt does have non fasting lab appt on 07/20/12 at 10:10 am and has f/u appt with Dr Milinda Antis on 07/24/12 at 3:45pm. Advised pt to call back if further question.

## 2012-07-17 NOTE — Telephone Encounter (Signed)
Patient calling about labwork appt 07/20/12.  States he was surprised when he got the reminder call because he is diabetic and believes he should "come in first thing in the AM and then have breakfast."  TC to office; per staff, his labwork is Hgb A1C and will not require fasting.  Patient advised; will keep scheduled appt time.  Info to office.

## 2012-07-20 ENCOUNTER — Other Ambulatory Visit (INDEPENDENT_AMBULATORY_CARE_PROVIDER_SITE_OTHER): Payer: BC Managed Care – PPO

## 2012-07-20 DIAGNOSIS — E119 Type 2 diabetes mellitus without complications: Secondary | ICD-10-CM

## 2012-07-20 LAB — HEMOGLOBIN A1C: Hgb A1c MFr Bld: 6.7 % — ABNORMAL HIGH (ref 4.6–6.5)

## 2012-07-24 ENCOUNTER — Ambulatory Visit (INDEPENDENT_AMBULATORY_CARE_PROVIDER_SITE_OTHER): Payer: BC Managed Care – PPO | Admitting: Family Medicine

## 2012-07-24 ENCOUNTER — Encounter: Payer: Self-pay | Admitting: Family Medicine

## 2012-07-24 VITALS — BP 102/58 | HR 66 | Temp 97.8°F | Ht 71.0 in | Wt 215.2 lb

## 2012-07-24 DIAGNOSIS — E119 Type 2 diabetes mellitus without complications: Secondary | ICD-10-CM

## 2012-07-24 DIAGNOSIS — S0990XA Unspecified injury of head, initial encounter: Secondary | ICD-10-CM | POA: Insufficient documentation

## 2012-07-24 NOTE — Assessment & Plan Note (Signed)
Small superfical laceration over L eye -no symptoms Nl exam Red flags disc- see AVS- pt voiced understanding Cleaned/ dressed with band aid

## 2012-07-24 NOTE — Progress Notes (Signed)
Subjective:    Patient ID: Harry Payne, male    DOB: 04/25/1951, 61 y.o.   MRN: [id number]  HPI Here for f/u DM  Has lost almost 20 lb total   Diabetes Home sugar results - getting better and better - higher in the am 138- lower later in the day (as low as 80s) DM diet - is better and better  Exercise - is back to the walking - at least 3 miles  Symptoms-none  A1C last 6.7- much better  No problems with medications  Renal protection- on ace  Last eye exam    Is eating less and better   Just had an accident at work -- just today  He will not turn in anything with workman's comp  Hit head on a metal dumpster  Has a headache today   No vision problems  No nausea or vomiting   Is really tired about mid day  Exercises at 6:30  Only gets 5 hours of sleep at night   Patient Active Problem List  Diagnosis  . DIABETES-TYPE 2  . B12 DEFICIENCY  . HYPERLIPIDEMIA  . ANXIETY  . MYOCARDIAL INFARCTION, HX OF  . CORONARY ARTERY DISEASE  . HYPOTENSION  . GERD  . INSOMNIA  . COLONIC POLYPS, ADENOMATOUS, HX OF  . TOBACCO USE, QUIT  . Fatigue  . Obesity   Past Medical History  Diagnosis Date  . Anxiety   . CAD (coronary artery disease)   . GERD (gastroesophageal reflux disease)   . Other and unspecified hyperlipidemia   . Depression   . Hypotension   . Hx of adenomatous colonic polyps   . Hemorrhoids    Past Surgical History  Procedure Date  . Closed reduction hand fracture 1990    left hand   History  Substance Use Topics  . Smoking status: Former Smoker    Quit date: 12/31/2003  . Smokeless tobacco: Not on file  . Alcohol Use: No   Family History  Problem Relation Age of Onset  . Prostate cancer Father   . Diabetes Brother   . Diabetes Brother    No Known Allergies Current Outpatient Prescriptions on File Prior to Visit  Medication Sig Dispense Refill  . aspirin 81 MG tablet Take 81 mg by mouth 2 (two) times daily.        . carvedilol (COREG) 25 MG  tablet       . clopidogrel (PLAVIX) 75 MG tablet Take 1 tablet (75 mg total) by mouth daily.  30 tablet  6  . glucose blood (ONE TOUCH TEST STRIPS) test strip Use as instructed, once daily  100 each  3  . metFORMIN (GLUCOPHAGE) 500 MG tablet Take 1 tablet (500 mg total) by mouth 2 (two) times daily with a meal.  60 tablet  11  . nitroGLYCERIN (NITROSTAT) 0.4 MG SL tablet Place 1 tablet (0.4 mg total) under the tongue every 5 (five) minutes as needed.  25 tablet  3  . ramipril (ALTACE) 5 MG capsule Take 1 capsule (5 mg total) by mouth daily.  30 capsule  11  . ranitidine (ZANTAC) 150 MG tablet Take 1 tablet (150 mg total) by mouth 2 (two) times daily.  120 tablet  5  . rosuvastatin (CRESTOR) 40 MG tablet Take 1 tablet (40 mg total) by mouth daily.  30 tablet  11  . vitamin B-12 (CYANOCOBALAMIN) 1000 MCG tablet Take 1,000 mcg by mouth daily.        Marland Kitchen  DISCONTD: pantoprazole (PROTONIX) 40 MG tablet Take 1 tablet (40 mg total) by mouth daily.  30 tablet  11    Td was 4 years ago    Review of Systems Review of Systems  Constitutional: Negative for fever, appetite change, fatigue and unexpected weight change.  Eyes: Negative for pain and visual disturbance.  Respiratory: Negative for cough and shortness of breath.   Cardiovascular: Negative for cp or palpitations    Gastrointestinal: Negative for nausea, diarrhea and constipation.  Genitourinary: Negative for urgency and frequency.  Skin: Negative for pallor or rash  pos for laceration over eye  Neurological: Negative for weakness, light-headedness, numbness Hematological: Negative for adenopathy. Does not bruise/bleed easily.  Psychiatric/Behavioral: Negative for dysphoric mood. The patient is not nervous/anxious.         Objective:   Physical Exam  Constitutional: He is oriented to person, place, and time. He appears well-developed and well-nourished. No distress.  HENT:  Head: Normocephalic.  Mouth/Throat: Oropharynx is clear and  moist.  Eyes: Conjunctivae and EOM are normal. Pupils are equal, round, and reactive to light. No scleral icterus.  Neck: Normal range of motion. Neck supple. No JVD present. Carotid bruit is not present. No thyromegaly present.  Cardiovascular: Normal rate, regular rhythm and normal heart sounds.  Exam reveals no gallop.   Pulmonary/Chest: Effort normal and breath sounds normal. No respiratory distress. He has no wheezes.  Abdominal: Soft. Bowel sounds are normal. He exhibits no distension, no abdominal bruit and no mass. There is no tenderness.  Musculoskeletal: He exhibits no tenderness.  Lymphadenopathy:    He has no cervical adenopathy.  Neurological: He is alert and oriented to person, place, and time. He has normal reflexes. No cranial nerve deficit or sensory deficit. He exhibits normal muscle tone. He displays a negative Romberg sign. Coordination and gait normal.  Skin: Skin is warm and dry. No rash noted. No erythema. No pallor.       Superficial laceration over L eye - not  Bleeding / slt tender  No bony tenderness on face   Psychiatric: He has a normal mood and affect.          Assessment & Plan:

## 2012-07-24 NOTE — Assessment & Plan Note (Signed)
Much improved with lifestyle change - a1c down to 6.7  Commended Will keep working on wt loss F/u 6 mo  utd opthy

## 2012-07-24 NOTE — Patient Instructions (Addendum)
A1c is great at 6.7 Good job with weight loss and diet / exercise  With head injury- if you develop any severe head pain/vision change/ nausea / vomiting/ dizziness/ or personality change - go to ER to get checked with a cat scan Otherwise keep wound clean with soap and water and update me if redness or drainage  Follow up in 6 months with labs prior

## 2012-07-29 ENCOUNTER — Other Ambulatory Visit: Payer: Self-pay

## 2012-07-29 MED ORDER — CLOPIDOGREL BISULFATE 75 MG PO TABS: 75.0000 mg | ORAL_TABLET | Freq: Every day | ORAL | Status: DC

## 2012-09-17 ENCOUNTER — Telehealth: Payer: Self-pay | Admitting: Family Medicine

## 2012-09-17 DIAGNOSIS — E119 Type 2 diabetes mellitus without complications: Secondary | ICD-10-CM

## 2012-09-17 DIAGNOSIS — Z1211 Encounter for screening for malignant neoplasm of colon: Secondary | ICD-10-CM | POA: Insufficient documentation

## 2012-09-17 DIAGNOSIS — E538 Deficiency of other specified B group vitamins: Secondary | ICD-10-CM

## 2012-09-17 DIAGNOSIS — Z0000 Encounter for general adult medical examination without abnormal findings: Secondary | ICD-10-CM | POA: Insufficient documentation

## 2012-09-17 DIAGNOSIS — E785 Hyperlipidemia, unspecified: Secondary | ICD-10-CM

## 2012-09-17 DIAGNOSIS — Z125 Encounter for screening for malignant neoplasm of prostate: Secondary | ICD-10-CM | POA: Insufficient documentation

## 2012-09-17 NOTE — Telephone Encounter (Signed)
Message copied by Judy Pimple on Thu Sep 17, 2012  9:46 PM ------      Message from: Alvina Chou      Created: Thu Sep 10, 2012  9:11 AM      Regarding: Lab orders for Friday, 09-18-12       Patient is scheduled for CPX labs, please order future labs, Thanks , Camelia Eng

## 2012-09-18 ENCOUNTER — Other Ambulatory Visit: Payer: BC Managed Care – PPO

## 2012-09-25 ENCOUNTER — Encounter: Payer: BC Managed Care – PPO | Admitting: Family Medicine

## 2012-10-21 ENCOUNTER — Telehealth: Payer: Self-pay

## 2012-10-21 ENCOUNTER — Other Ambulatory Visit: Payer: Self-pay

## 2012-10-21 MED ORDER — ROSUVASTATIN CALCIUM 40 MG PO TABS: 40.0000 mg | ORAL_TABLET | Freq: Every day | ORAL | Status: DC

## 2012-10-21 MED ORDER — METFORMIN HCL 500 MG PO TABS: 500.0000 mg | ORAL_TABLET | Freq: Two times a day (BID) | ORAL | Status: DC

## 2012-10-21 MED ORDER — RAMIPRIL 5 MG PO CAPS: 5.0000 mg | ORAL_CAPSULE | Freq: Every day | ORAL | Status: DC

## 2012-10-21 NOTE — Telephone Encounter (Signed)
Opened in error

## 2012-10-21 NOTE — Telephone Encounter (Signed)
[facility] faxed refill crestor,metformin and ramipril x 5.

## 2013-01-20 ENCOUNTER — Other Ambulatory Visit (INDEPENDENT_AMBULATORY_CARE_PROVIDER_SITE_OTHER): Payer: BC Managed Care – PPO

## 2013-01-20 DIAGNOSIS — E119 Type 2 diabetes mellitus without complications: Secondary | ICD-10-CM

## 2013-01-20 DIAGNOSIS — Z125 Encounter for screening for malignant neoplasm of prostate: Secondary | ICD-10-CM

## 2013-01-20 DIAGNOSIS — E538 Deficiency of other specified B group vitamins: Secondary | ICD-10-CM

## 2013-01-20 DIAGNOSIS — E785 Hyperlipidemia, unspecified: Secondary | ICD-10-CM

## 2013-01-20 DIAGNOSIS — Z0000 Encounter for general adult medical examination without abnormal findings: Secondary | ICD-10-CM

## 2013-01-20 LAB — CBC WITH DIFFERENTIAL/PLATELET
Eosinophils Relative: 2.9 % (ref 0.0–5.0)
HCT: 46.3 % (ref 39.0–52.0)
Hemoglobin: 15.8 g/dL (ref 13.0–17.0)
Lymphs Abs: 2 10*3/uL (ref 0.7–4.0)
MCV: 93.1 fl (ref 78.0–100.0)
Monocytes Absolute: 0.6 10*3/uL (ref 0.1–1.0)
Neutro Abs: 4.8 10*3/uL (ref 1.4–7.7)
Platelets: 221 10*3/uL (ref 150.0–400.0)
RDW: 13.6 % (ref 11.5–14.6)

## 2013-01-20 LAB — LIPID PANEL
Cholesterol: 110 mg/dL (ref 0–200)
HDL: 31.1 mg/dL — ABNORMAL LOW (ref >39.00)
Triglycerides: 103 mg/dL (ref 0.0–149.0)
VLDL: 20.6 mg/dL (ref 0.0–40.0)

## 2013-01-20 LAB — TSH: TSH: 0.79 u[IU]/mL (ref 0.35–5.50)

## 2013-01-20 LAB — COMPREHENSIVE METABOLIC PANEL
Alkaline Phosphatase: 52 U/L (ref 39–117)
Creatinine, Ser: 0.9 mg/dL (ref 0.4–1.5)
GFR: 92.37 mL/min (ref >60.00)
Glucose, Bld: 210 mg/dL — ABNORMAL HIGH (ref 70–99)
Sodium: 139 mEq/L (ref 135–145)
Total Bilirubin: 1.1 mg/dL (ref 0.3–1.2)
Total Protein: 7 g/dL (ref 6.0–8.3)

## 2013-01-20 LAB — HEMOGLOBIN A1C: Hgb A1c MFr Bld: 7.7 % — ABNORMAL HIGH (ref 4.6–6.5)

## 2013-01-22 ENCOUNTER — Telehealth: Payer: Self-pay | Admitting: Emergency Medicine

## 2013-01-22 NOTE — Telephone Encounter (Signed)
Refill request for carvedilol 25mg   Patient has not been seen since 12/23/11.  Please advise.

## 2013-01-25 ENCOUNTER — Encounter (HOSPITAL_COMMUNITY): Payer: Self-pay | Admitting: *Deleted

## 2013-01-25 MED ORDER — CARVEDILOL 25 MG PO TABS: 25.0000 mg | ORAL_TABLET | Freq: Two times a day (BID) | ORAL | Status: DC

## 2013-01-25 NOTE — Telephone Encounter (Signed)
rx sent in with 1 refill, letter mailed to pt to call us to schedule f/u appt

## 2013-01-27 ENCOUNTER — Ambulatory Visit: Payer: BC Managed Care – PPO | Admitting: Family Medicine

## 2013-02-09 ENCOUNTER — Encounter: Payer: Self-pay | Admitting: Family Medicine

## 2013-02-09 ENCOUNTER — Ambulatory Visit (INDEPENDENT_AMBULATORY_CARE_PROVIDER_SITE_OTHER): Payer: BC Managed Care – PPO | Admitting: Family Medicine

## 2013-02-09 VITALS — BP 114/68 | HR 70 | Temp 97.5°F | Ht 71.0 in | Wt 221.0 lb

## 2013-02-09 DIAGNOSIS — E538 Deficiency of other specified B group vitamins: Secondary | ICD-10-CM

## 2013-02-09 DIAGNOSIS — E785 Hyperlipidemia, unspecified: Secondary | ICD-10-CM

## 2013-02-09 DIAGNOSIS — E669 Obesity, unspecified: Secondary | ICD-10-CM

## 2013-02-09 DIAGNOSIS — E119 Type 2 diabetes mellitus without complications: Secondary | ICD-10-CM

## 2013-02-09 MED ORDER — RANITIDINE HCL 150 MG PO TABS: 150.0000 mg | ORAL_TABLET | Freq: Two times a day (BID) | ORAL | Status: DC

## 2013-02-09 NOTE — Assessment & Plan Note (Signed)
a1c up a pt to 7.7 Pt claims this is from poor holiday eating for 1-2 mo Is back on track with diet and exercise Declines further medication  Re check 3 mo and f/u

## 2013-02-09 NOTE — Patient Instructions (Addendum)
No change in medicines  I'm so glad you are back on track with diet and exercise - keep working on weight loss Follow up with me in 3 months with labs prior

## 2013-02-09 NOTE — Assessment & Plan Note (Signed)
Controlled with oral supplementation

## 2013-02-09 NOTE — Assessment & Plan Note (Signed)
Discussed how this problem influences overall health and the risks it imposes  Reviewed plan for weight loss with lower calorie diet (via better food choices and also portion control or program like weight watchers) and exercise building up to or more than 30 minutes 5 days per week including some aerobic activity    

## 2013-02-09 NOTE — Assessment & Plan Note (Signed)
Controlled with statin and diet  Disc goals for lipids and reasons to control them Rev labs with pt Rev low sat fat diet in detail  

## 2013-02-09 NOTE — Progress Notes (Signed)
Subjective:    Patient ID: Harry Payne, male    DOB: [birth year], 62 y.o.   MRN: [id number]  HPI Here for f/u of chronic medical problems   Wt is up 6 lb with bmi of 30  May need to bring his portion size down a bit too  Obese  Diabetes- he was off the wagon for over a month  Home sugar results  DM diet - eating is pretty good overall , is following a diabetic  Exercise - much less due to bad weather , now has joined the gym -- 45 min visits , 4-5 days per week  Symptoms A1C last  Lab Results  Component Value Date   HGBA1C 7.7* 01/20/2013  this is up from 6.7  No problems with medications - and does not want to add more  Renal protection - is on ace  Last eye exam 6/13  Lipid Lab Results  Component Value Date   CHOL 110 01/20/2013   HDL 31.10* 01/20/2013   LDLCALC 58 01/20/2013   TRIG 103.0 01/20/2013   CHOLHDL 4 01/20/2013       Chemistry      Component Value Date/Time   NA 139 01/20/2013 0820   K 4.4 01/20/2013 0820   CL 103 01/20/2013 0820   CO2 29 01/20/2013 0820   BUN 19 01/20/2013 0820   CREATININE 0.9 01/20/2013 0820      Component Value Date/Time   CALCIUM 9.5 01/20/2013 0820   ALKPHOS 52 01/20/2013 0820   AST 23 01/20/2013 0820   ALT 30 01/20/2013 0820   BILITOT 1.1 01/20/2013 0820       B12-oral supp Lab Results  Component Value Date   VITAMINB12 265 01/20/2013      Lab Results  Component Value Date   PSA 1.86 01/20/2013   PSA 1.17 01/31/2005    Patient Active Problem List  Diagnosis  . DIABETES-TYPE 2  . B12 DEFICIENCY  . HYPERLIPIDEMIA  . ANXIETY  . MYOCARDIAL INFARCTION, HX OF  . CORONARY ARTERY DISEASE  . HYPOTENSION  . GERD  . INSOMNIA  . COLONIC POLYPS, ADENOMATOUS, HX OF  . TOBACCO USE, QUIT  . Fatigue  . Obesity  . Closed head injury  . Routine general medical examination at a health care facility  . Colon cancer screening  . Prostate cancer screening   Past Medical History  Diagnosis Date  . Anxiety   . CAD (coronary artery  disease)   . GERD (gastroesophageal reflux disease)   . Other and unspecified hyperlipidemia   . Depression   . Hypotension   . Hx of adenomatous colonic polyps   . Hemorrhoids    Past Surgical History  Procedure Laterality Date  . Closed reduction hand fracture  1990    left hand   History  Substance Use Topics  . Smoking status: Former Games developer  . Smokeless tobacco: Not on file     Comment: over 19yrs ago  . Alcohol Use: No   Family History  Problem Relation Age of Onset  . Prostate cancer Father   . Diabetes Brother   . Diabetes Brother    No Known Allergies Current Outpatient Prescriptions on File Prior to Visit  Medication Sig Dispense Refill  . aspirin 81 MG tablet Take 81 mg by mouth 2 (two) times daily.        . carvedilol (COREG) 25 MG tablet Take 1 tablet (25 mg total) by mouth 2 (two) times daily with a meal.  60 tablet  1  . clopidogrel (PLAVIX) 75 MG tablet Take 1 tablet (75 mg total) by mouth daily.  30 tablet  6  . glucose blood (ONE TOUCH TEST STRIPS) test strip Use as instructed, once daily  100 each  3  . metFORMIN (GLUCOPHAGE) 500 MG tablet Take 1 tablet (500 mg total) by mouth 2 (two) times daily with a meal.  60 tablet  5  . ramipril (ALTACE) 5 MG capsule Take 1 capsule (5 mg total) by mouth daily.  30 capsule  5  . ranitidine (ZANTAC) 150 MG tablet Take 1 tablet (150 mg total) by mouth 2 (two) times daily.  120 tablet  5  . rosuvastatin (CRESTOR) 40 MG tablet Take 1 tablet (40 mg total) by mouth daily.  30 tablet  5  . vitamin B-12 (CYANOCOBALAMIN) 1000 MCG tablet Take 1,000 mcg by mouth daily.        . nitroGLYCERIN (NITROSTAT) 0.4 MG SL tablet Place 1 tablet (0.4 mg total) under the tongue every 5 (five) minutes as needed.  25 tablet  3  . [DISCONTINUED] pantoprazole (PROTONIX) 40 MG tablet Take 1 tablet (40 mg total) by mouth daily.  30 tablet  11   No current facility-administered medications on file prior to visit.    Review of Systems Review of  Systems  Constitutional: Negative for fever, appetite change, fatigue and unexpected weight change.  Eyes: Negative for pain and visual disturbance.  Respiratory: Negative for cough and shortness of breath.   Cardiovascular: Negative for cp or palpitations    Gastrointestinal: Negative for nausea, diarrhea and constipation.  Genitourinary: Negative for urgency and frequency.  Skin: Negative for pallor or rash   Neurological: Negative for weakness, light-headedness, numbness and headaches.  Hematological: Negative for adenopathy. Does not bruise/bleed easily.  Psychiatric/Behavioral: Negative for dysphoric mood. The patient is not nervous/anxious.         Objective:   Physical Exam  Constitutional: He appears well-developed and well-nourished. No distress.  obese and well appearing   HENT:  Head: Normocephalic and atraumatic.  Nose: Nose normal.  Mouth/Throat: Oropharynx is clear and moist.  Eyes: Conjunctivae and EOM are normal. Pupils are equal, round, and reactive to light. No scleral icterus.  Neck: Normal range of motion. Neck supple. No JVD present. Carotid bruit is not present. No thyromegaly present.  Cardiovascular: Normal rate, regular rhythm and intact distal pulses.  Exam reveals no gallop.   Pulmonary/Chest: Effort normal and breath sounds normal. No respiratory distress. He has no wheezes.  Abdominal: Soft. Bowel sounds are normal. He exhibits no distension, no abdominal bruit and no mass. There is no tenderness.  Musculoskeletal: He exhibits no edema and no tenderness.  Lymphadenopathy:    He has no cervical adenopathy.  Neurological: He is alert. He has normal reflexes. No cranial nerve deficit. He exhibits normal muscle tone. Coordination normal.  Skin: Skin is warm and dry. No rash noted. No erythema. No pallor.  Psychiatric: He has a normal mood and affect.          Assessment & Plan:

## 2013-02-23 ENCOUNTER — Other Ambulatory Visit (HOSPITAL_COMMUNITY): Payer: Self-pay | Admitting: *Deleted

## 2013-02-23 MED ORDER — CLOPIDOGREL BISULFATE 75 MG PO TABS: 75.0000 mg | ORAL_TABLET | Freq: Every day | ORAL | Status: DC

## 2013-03-01 ENCOUNTER — Encounter (HOSPITAL_COMMUNITY): Payer: Self-pay

## 2013-03-01 ENCOUNTER — Ambulatory Visit (HOSPITAL_COMMUNITY)
Admission: RE | Admit: 2013-03-01 | Discharge: 2013-03-01 | Disposition: A | Discharge status: Home / Self Care | Payer: BC Managed Care – PPO | Source: Ambulatory Visit | Attending: Internal Medicine | Admitting: Internal Medicine

## 2013-03-01 VITALS — BP 122/68 | HR 68 | Wt 222.0 lb

## 2013-03-01 DIAGNOSIS — I2510 Atherosclerotic heart disease of native coronary artery without angina pectoris: Secondary | ICD-10-CM | POA: Insufficient documentation

## 2013-03-01 DIAGNOSIS — I252 Old myocardial infarction: Secondary | ICD-10-CM | POA: Insufficient documentation

## 2013-03-01 MED ORDER — NITROGLYCERIN 0.4 MG SL SUBL: 0.4000 mg | SUBLINGUAL_TABLET | SUBLINGUAL | Status: DC | PRN

## 2013-03-01 NOTE — Progress Notes (Signed)
Patient ID: Harry Payne, male   DOB: [birth year], 62 y.o.   MRN: [id number]  PCP: Dr Lucretia Roers  HPI: Harry Payne is a 62 yo male with CAD s/p anterior MI 2004 (s/p stenting LAD and LCX), DM2, and hyperlipidemia (previously in AIM-HIGH trial). EF 48% by MR. Myoview 2009 EF 43%. anterior apical scar. no ischemia.   He presented to [facility] 02/16/11 with chest pain.  He ruled out for myocardial infarction.  Cardiac catheterization done 02/18/2011: Proximal LAD stent patent; midcircumflex 25%, circumflex stent patent; RCA 25% of the PDA, proximal PDA 25%; posterior lateral 25-30%; EF 50% with anterior dyskinesis.  Given the non-obstructive nature of his anatomy, it was felt that his chest pain was noncardiac.   He returns for follow up. Denies SOB/PND/Orthopnea/CP. Complains of dyspnea when he starts exercising. Walks 3 miles 4-5 times per week. Dr Milinda Antis follows cholesterol.     ROS: All systems negative except as listed in HPI, PMH and Problem List.  Past Medical History  Diagnosis Date  . Anxiety   . CAD (coronary artery disease)   . GERD (gastroesophageal reflux disease)   . Other and unspecified hyperlipidemia   . Depression   . Hypotension   . Hx of adenomatous colonic polyps   . Hemorrhoids     Current Outpatient Prescriptions  Medication Sig Dispense Refill  . aspirin 81 MG tablet Take 81 mg by mouth 2 (two) times daily.        . carvedilol (COREG) 25 MG tablet Take 1 tablet (25 mg total) by mouth 2 (two) times daily with a meal.  60 tablet  1  . clopidogrel (PLAVIX) 75 MG tablet Take 1 tablet (75 mg total) by mouth daily.  30 tablet  6  . glucose blood (ONE TOUCH TEST STRIPS) test strip Use as instructed, once daily  100 each  3  . metFORMIN (GLUCOPHAGE) 500 MG tablet Take 1 tablet (500 mg total) by mouth 2 (two) times daily with a meal.  60 tablet  5  . ramipril (ALTACE) 5 MG capsule Take 1 capsule (5 mg total) by mouth daily.  30 capsule  5  . ranitidine (ZANTAC) 150  MG tablet Take 1 tablet (150 mg total) by mouth 2 (two) times daily.  180 tablet  3  . rosuvastatin (CRESTOR) 40 MG tablet Take 1 tablet (40 mg total) by mouth daily.  30 tablet  5  . vitamin B-12 (CYANOCOBALAMIN) 1000 MCG tablet Take 1,000 mcg by mouth daily.        . nitroGLYCERIN (NITROSTAT) 0.4 MG SL tablet Place 1 tablet (0.4 mg total) under the tongue every 5 (five) minutes as needed.  25 tablet  3  . [DISCONTINUED] pantoprazole (PROTONIX) 40 MG tablet Take 1 tablet (40 mg total) by mouth daily.  30 tablet  11   No current facility-administered medications for this encounter.     PHYSICAL EXAM: Filed Vitals:   03/01/13 1342  BP: 122/68  Pulse: 68   General:  Well appearing. No resp difficulty HEENT: normal Neck: supple. JVP flat. Carotids 2+ bilaterally; no bruits. No lymphadenopathy or thryomegaly appreciated. Cor: PMI normal. Regular rate & rhythm. No rubs, gallops or murmurs. Lungs: clear Abdomen: soft, nontender, nondistended. No hepatosplenomegaly. No bruits or masses. Good bowel sounds. Extremities: no cyanosis, clubbing, rash, edema Neuro: alert & orientedx3, cranial nerves grossly intact. Moves all 4 extremities w/o difficulty. Affect pleasant.    ECG: NSR 66.  Anteroseptal qs (old) No ST-T wave  abnormalities.     ASSESSMENT & PLAN:

## 2013-03-01 NOTE — Patient Instructions (Addendum)
Follow up in 1 year.

## 2013-03-08 NOTE — Assessment & Plan Note (Addendum)
Patient seen and examined with Tonye Becket, NP. We discussed all aspects of the encounter. I agree with the assessment as stated above. My thoughts below.  Attending: No evidence of ischemia. Continue current regimen. Yearly follow-up.

## 2013-03-29 ENCOUNTER — Other Ambulatory Visit: Payer: Self-pay | Admitting: Internal Medicine

## 2013-04-23 ENCOUNTER — Other Ambulatory Visit: Payer: Self-pay | Admitting: *Deleted

## 2013-04-23 MED ORDER — METFORMIN HCL 500 MG PO TABS: 500.0000 mg | ORAL_TABLET | Freq: Two times a day (BID) | ORAL | Status: DC

## 2013-04-23 MED ORDER — ROSUVASTATIN CALCIUM 40 MG PO TABS: 40.0000 mg | ORAL_TABLET | Freq: Every day | ORAL | Status: DC

## 2013-04-23 MED ORDER — RAMIPRIL 5 MG PO CAPS: 5.0000 mg | ORAL_CAPSULE | Freq: Every day | ORAL | Status: DC

## 2013-04-24 ENCOUNTER — Other Ambulatory Visit: Payer: Self-pay | Admitting: Family Medicine

## 2013-05-02 ENCOUNTER — Telehealth: Payer: Self-pay | Admitting: Family Medicine

## 2013-05-02 NOTE — Telephone Encounter (Signed)
Just the [facility] is fine-thanks

## 2013-05-02 NOTE — Telephone Encounter (Signed)
Message copied by Judy Pimple on Sun May 02, 2013  5:42 PM ------      Message from: Baldomero Lamy      Created: Tue Apr 20, 2013 12:37 PM      Regarding: f/u labs 5/5 Mon       Pt is on the lab schedule for fasting labs, but only an a1c is ordered. Is he to have anything else>            Thanks      Tasha ------

## 2013-05-03 ENCOUNTER — Other Ambulatory Visit (INDEPENDENT_AMBULATORY_CARE_PROVIDER_SITE_OTHER): Payer: BC Managed Care – PPO

## 2013-05-03 DIAGNOSIS — E119 Type 2 diabetes mellitus without complications: Secondary | ICD-10-CM

## 2013-05-03 LAB — HEMOGLOBIN A1C: Hgb A1c MFr Bld: 8.1 % — ABNORMAL HIGH (ref 4.6–6.5)

## 2013-05-11 ENCOUNTER — Ambulatory Visit (INDEPENDENT_AMBULATORY_CARE_PROVIDER_SITE_OTHER): Payer: BC Managed Care – PPO | Admitting: Family Medicine

## 2013-05-11 ENCOUNTER — Encounter: Payer: Self-pay | Admitting: Family Medicine

## 2013-05-11 VITALS — BP 132/70 | HR 65 | Temp 98.5°F | Ht 71.0 in | Wt 221.0 lb

## 2013-05-11 DIAGNOSIS — E119 Type 2 diabetes mellitus without complications: Secondary | ICD-10-CM

## 2013-05-11 MED ORDER — METFORMIN HCL 1000 MG PO TABS: 1000.0000 mg | ORAL_TABLET | Freq: Two times a day (BID) | ORAL | Status: DC

## 2013-05-11 NOTE — Patient Instructions (Addendum)
Increase your metformin to 1000 mg twice daily - if any side effects or problems let me know  Be as active as you can - I know you cannot fit in exercise  Try very hard to stick to a diabetic diet Don't forget your eye exam is due in June  Start checking some sugars at some random times - including mid day/ bedtime- so I can get an idea of when this blood sugar is peaking  Follow up with me with your log of sugars in 4-5 weeks

## 2013-05-11 NOTE — Assessment & Plan Note (Signed)
a1c is up  Pt says diet is good - but he is only following Dm diet about 50% at a time No longer exercises- says it is "impossible" with his work schedule and exhaustion from his heart problem He plans to seek disability for his heart problem at this point Will inc metformin to 1000 bid and update  Asked to check sugar at some off times due to a1c not lining up with his home sugars He will make his own eye appt in June  F/u with me in 4-5 weeks with sugar log

## 2013-05-11 NOTE — Progress Notes (Signed)
Subjective:    Patient ID: Harry Payne, male    DOB: [birth year], 62 y.o.   MRN: [id number]  HPI Here for f/u of DM  Wt is down 1 lb with bmi of 30  Diabetes Home sugar results am fasting about 95 , 2 hours pp about 140  DM diet - is not bad at all -pretty good  Exercise - not exercising at all - too busy and too worn out from work  He is just really tired  He thinks he needs to go on disability for heart problem Symptoms-no excessive thirst or urination  A1C last  Lab Results  Component Value Date   HGBA1C 8.1* 05/03/2013  it was 7.7  No problems with medications -metformin 500 bid  Renal protection-on ace  Last eye exam - was June 2013- was pt   Patient Active Problem List   Diagnosis Date Noted  . Routine general medical examination at a health care facility 09/17/2012  . Colon cancer screening 09/17/2012  . Prostate cancer screening 09/17/2012  . Closed head injury 07/24/2012  . Obesity 03/25/2012  . Fatigue 12/23/2011  . B12 DEFICIENCY 11/07/2009  . COLONIC POLYPS, ADENOMATOUS, HX OF 04/04/2009  . DIABETES-TYPE 2 02/10/2008  . HYPERLIPIDEMIA 02/09/2008  . ANXIETY 02/09/2008  . MYOCARDIAL INFARCTION, HX OF 02/09/2008  . CORONARY ARTERY DISEASE 02/09/2008  . GERD 02/09/2008  . INSOMNIA 02/09/2008  . TOBACCO USE, QUIT 02/09/2008  . HYPOTENSION 02/03/2008   Past Medical History  Diagnosis Date  . Anxiety   . CAD (coronary artery disease)   . GERD (gastroesophageal reflux disease)   . Other and unspecified hyperlipidemia   . Depression   . Hypotension   . Hx of adenomatous colonic polyps   . Hemorrhoids    Past Surgical History  Procedure Laterality Date  . Closed reduction hand fracture  1990    left hand   History  Substance Use Topics  . Smoking status: Former Games developer  . Smokeless tobacco: Not on file     Comment: over 89yrs ago  . Alcohol Use: No   Family History  Problem Relation Age of Onset  . Prostate cancer Father   . Diabetes Brother    . Diabetes Brother    No Known Allergies Current Outpatient Prescriptions on File Prior to Visit  Medication Sig Dispense Refill  . aspirin 81 MG tablet Take 81 mg by mouth 2 (two) times daily.        . carvedilol (COREG) 25 MG tablet TAKE 1 TABLET (25 MG TOTAL) BY MOUTH 2 (TWO) TIMES DAILY WITH A MEAL.  60 tablet  6  . clopidogrel (PLAVIX) 75 MG tablet Take 1 tablet (75 mg total) by mouth daily.  30 tablet  6  . glucose blood (ONE TOUCH ULTRA TEST) test strip Check sugar once per day and prn for DM 250.0  100 each  0  . metFORMIN (GLUCOPHAGE) 500 MG tablet Take 1 tablet (500 mg total) by mouth 2 (two) times daily with a meal.  60 tablet  5  . nitroGLYCERIN (NITROSTAT) 0.4 MG SL tablet Place 1 tablet (0.4 mg total) under the tongue every 5 (five) minutes as needed.  25 tablet  3  . ramipril (ALTACE) 5 MG capsule Take 1 capsule (5 mg total) by mouth daily.  30 capsule  5  . ranitidine (ZANTAC) 150 MG tablet Take 1 tablet (150 mg total) by mouth 2 (two) times daily.  180 tablet  3  . rosuvastatin (  CRESTOR) 40 MG tablet Take 1 tablet (40 mg total) by mouth daily.  30 tablet  5  . vitamin B-12 (CYANOCOBALAMIN) 1000 MCG tablet Take 1,000 mcg by mouth daily.        . [DISCONTINUED] pantoprazole (PROTONIX) 40 MG tablet Take 1 tablet (40 mg total) by mouth daily.  30 tablet  11   No current facility-administered medications on file prior to visit.      Review of Systems Review of Systems  Constitutional: Negative for fever, appetite change, fatigue and unexpected weight change.  Eyes: Negative for pain and visual disturbance.  Respiratory: Negative for cough and shortness of breath.   Cardiovascular: Negative for cp or palpitations    Gastrointestinal: Negative for nausea, diarrhea and constipation.  Genitourinary: Negative for urgency and frequency.  Skin: Negative for pallor or rash   Neurological: Negative for weakness, light-headedness, numbness and headaches.  Hematological: Negative for  adenopathy. Does not bruise/bleed easily.  Psychiatric/Behavioral: Negative for dysphoric mood. The patient is not nervous/anxious.         Objective:   Physical Exam  Constitutional: He appears well-developed and well-nourished. No distress.  HENT:  Head: Normocephalic and atraumatic.  Mouth/Throat: Oropharynx is clear and moist.  Eyes: Conjunctivae and EOM are normal. Pupils are equal, round, and reactive to light.  Neck: Normal range of motion. Neck supple. No JVD present. Carotid bruit is not present. No thyromegaly present.  Cardiovascular: Normal rate, regular rhythm and intact distal pulses.  Exam reveals no gallop.   Pulmonary/Chest: Effort normal and breath sounds normal. No respiratory distress. He has no wheezes.  No crackles   Abdominal: He exhibits no abdominal bruit.  Musculoskeletal: He exhibits no edema.  Lymphadenopathy:    He has no cervical adenopathy.  Neurological: He is alert. He has normal reflexes. No cranial nerve deficit. He exhibits normal muscle tone. Coordination normal.  Skin: Skin is warm and dry. No rash noted. No erythema. No pallor.  Psychiatric: He has a normal mood and affect.          Assessment & Plan:

## 2013-06-16 ENCOUNTER — Ambulatory Visit: Payer: BC Managed Care – PPO | Admitting: Family Medicine

## 2013-07-08 ENCOUNTER — Other Ambulatory Visit: Payer: Self-pay

## 2013-07-09 ENCOUNTER — Ambulatory Visit (INDEPENDENT_AMBULATORY_CARE_PROVIDER_SITE_OTHER): Payer: BC Managed Care – PPO | Admitting: Family Medicine

## 2013-07-09 ENCOUNTER — Encounter: Payer: Self-pay | Admitting: Family Medicine

## 2013-07-09 VITALS — BP 102/64 | HR 72 | Temp 97.7°F | Ht 71.0 in | Wt 216.2 lb

## 2013-07-09 DIAGNOSIS — E119 Type 2 diabetes mellitus without complications: Secondary | ICD-10-CM

## 2013-07-09 MED ORDER — CLOPIDOGREL BISULFATE 75 MG PO TABS: 75.0000 mg | ORAL_TABLET | Freq: Every day | ORAL | Status: DC

## 2013-07-09 MED ORDER — CARVEDILOL 25 MG PO TABS: 25.0000 mg | ORAL_TABLET | Freq: Two times a day (BID) | ORAL | Status: DC

## 2013-07-09 NOTE — Patient Instructions (Signed)
Don't forget to schedule your yearly eye exam  Keep working on good diet and weight loss and also exercise when you  Schedule non fasting labs in about a month for A1c and comp met panel

## 2013-07-09 NOTE — Progress Notes (Signed)
Subjective:    Patient ID: Harry Payne, male    DOB: 11/07/1951, 62 y.o.   MRN: [id number]  HPI Here for f/u of DM  Last visit we disc lifestyle change and inc his metformin to 1000 mg bid  Wt is down 5 lb with bmi of 30  Diabetes Home sugar results fasting 88-105 fasting and then after a meal (2 hours) - about 165  DM diet is improved  Exercise still cannot exercise due to fatigue and schedule  Symptoms- none  A1C last  Lab Results  Component Value Date   HGBA1C 8.1* 05/03/2013    No problems with medications no problems or side eff  Renal protection on ace Last eye exam 6/13  BP: 102/64 mmHg    Vomited times one last night without any other symptoms     Review of Systems Patient Active Problem List   Diagnosis Date Noted  . Routine general medical examination at a health care facility 09/17/2012  . Colon cancer screening 09/17/2012  . Prostate cancer screening 09/17/2012  . Closed head injury 07/24/2012  . Obesity 03/25/2012  . Fatigue 12/23/2011  . B12 DEFICIENCY 11/07/2009  . COLONIC POLYPS, ADENOMATOUS, HX OF 04/04/2009  . DIABETES-TYPE 2 02/10/2008  . HYPERLIPIDEMIA 02/09/2008  . ANXIETY 02/09/2008  . MYOCARDIAL INFARCTION, HX OF 02/09/2008  . CORONARY ARTERY DISEASE 02/09/2008  . GERD 02/09/2008  . INSOMNIA 02/09/2008  . TOBACCO USE, QUIT 02/09/2008  . HYPOTENSION 02/03/2008   Past Medical History  Diagnosis Date  . Anxiety   . CAD (coronary artery disease)   . GERD (gastroesophageal reflux disease)   . Other and unspecified hyperlipidemia   . Depression   . Hypotension   . Hx of adenomatous colonic polyps   . Hemorrhoids    Past Surgical History  Procedure Laterality Date  . Closed reduction hand fracture  1990    left hand   History  Substance Use Topics  . Smoking status: Former Games developer  . Smokeless tobacco: Not on file     Comment: over 45yrs ago  . Alcohol Use: No   Family History  Problem Relation Age of Onset  . Prostate  cancer Father   . Diabetes Brother   . Diabetes Brother    No Known Allergies Current Outpatient Prescriptions on File Prior to Visit  Medication Sig Dispense Refill  . aspirin 81 MG tablet Take 81 mg by mouth 2 (two) times daily.        Marland Kitchen glucose blood (ONE TOUCH ULTRA TEST) test strip Check sugar once per day and prn for DM 250.0  100 each  0  . metFORMIN (GLUCOPHAGE) 1000 MG tablet Take 1 tablet (1,000 mg total) by mouth 2 (two) times daily with a meal.  60 tablet  11  . nitroGLYCERIN (NITROSTAT) 0.4 MG SL tablet Place 1 tablet (0.4 mg total) under the tongue every 5 (five) minutes as needed.  25 tablet  3  . ramipril (ALTACE) 5 MG capsule Take 1 capsule (5 mg total) by mouth daily.  30 capsule  5  . ranitidine (ZANTAC) 150 MG tablet Take 1 tablet (150 mg total) by mouth 2 (two) times daily.  180 tablet  3  . rosuvastatin (CRESTOR) 40 MG tablet Take 1 tablet (40 mg total) by mouth daily.  30 tablet  5  . vitamin B-12 (CYANOCOBALAMIN) 1000 MCG tablet Take 1,000 mcg by mouth daily.        . [DISCONTINUED] pantoprazole (PROTONIX) 40 MG tablet  Take 1 tablet (40 mg total) by mouth daily.  30 tablet  11   No current facility-administered medications on file prior to visit.      Review of Systems  Constitutional: Negative for fever, appetite change,  and unexpected weight change. pos for fatigue from very difficult job  Eyes: Negative for pain and visual disturbance.  Respiratory: Negative for cough and shortness of breath.   Cardiovascular: Negative for cp or palpitations    Gastrointestinal: Negative for nausea, diarrhea and constipation.  Genitourinary: Negative for urgency and frequency. neg for excessive thirst  Skin: Negative for pallor or rash   Neurological: Negative for weakness, light-headedness, numbness and headaches.  Hematological: Negative for adenopathy. Does not bruise/bleed easily.  Psychiatric/Behavioral: Negative for dysphoric mood. The patient is not nervous/anxious.  pt  is frustrated about his job and occas irritable for that reason      Objective:   Physical Exam  Constitutional: He appears well-developed and well-nourished. No distress.  overwt and well app  HENT:  Head: Normocephalic and atraumatic.  Mouth/Throat: Oropharynx is clear and moist.  Eyes: Conjunctivae are normal. Pupils are equal, round, and reactive to light. No scleral icterus.  Neck: Normal range of motion. Neck supple. No JVD present. Carotid bruit is not present. No thyromegaly present.  Cardiovascular: Normal rate, regular rhythm, normal heart sounds and intact distal pulses.  Exam reveals no gallop.   Pulmonary/Chest: Effort normal and breath sounds normal. No respiratory distress. He has no wheezes. He has no rales.  Abdominal: He exhibits no abdominal bruit.  Musculoskeletal: He exhibits no edema and no tenderness.  Lymphadenopathy:    He has no cervical adenopathy.  Neurological: He is alert. He has normal reflexes. No cranial nerve deficit. Coordination normal.  Skin: Skin is warm and dry. No rash noted. No pallor.  Psychiatric: He has a normal mood and affect.          Assessment & Plan:

## 2013-07-09 NOTE — Assessment & Plan Note (Signed)
With increase in metformin and better diet and wt loss pt has better home sugars Expect imp A1c in a month when it is due Lab ordered Reminded to schedule his annual eye exam again  Enc exercise when he can (states he cannot right now due to his job)

## 2013-07-22 ENCOUNTER — Telehealth: Payer: Self-pay | Admitting: *Deleted

## 2013-07-22 NOTE — Telephone Encounter (Signed)
Received fax saying PA needed for pt's Crestor, form placed in your inbox, but [facility] did want me to let you know generic Lipitor is covered so not sure if you want to do PA or change med to Lipitor

## 2013-07-23 NOTE — Telephone Encounter (Signed)
I think his cardiologist specifically wants him on crestor so please forward this to cardiology-thanks

## 2013-07-26 NOTE — Telephone Encounter (Signed)
PA form faxed to pt's Cardiologist

## 2013-08-03 ENCOUNTER — Telehealth (HOSPITAL_COMMUNITY): Payer: Self-pay | Admitting: Cardiology

## 2013-08-03 NOTE — Telephone Encounter (Signed)
Pt called to request med change. Insurance no longer covers Crestor 20mg  Please change to another -statin ASAP as pt has been w/o meds x 5 days [facility] [employer]

## 2013-08-06 ENCOUNTER — Telehealth: Payer: Self-pay | Admitting: *Deleted

## 2013-08-06 ENCOUNTER — Other Ambulatory Visit: Payer: Self-pay | Admitting: *Deleted

## 2013-08-06 MED ORDER — ATORVASTATIN CALCIUM 40 MG PO TABS: 40.0000 mg | ORAL_TABLET | Freq: Every day | ORAL | Status: DC

## 2013-08-06 NOTE — Telephone Encounter (Signed)
Opened in error

## 2013-08-06 NOTE — Addendum Note (Signed)
Addended by: Roxy Manns A on: 08/06/2013 05:27 PM   Modules accepted: Orders, Medications

## 2013-08-06 NOTE — Telephone Encounter (Signed)
[facility] advised we did not prescribe Crestor and we do not manage pt's lipids, they are followed by Dr Milinda Antis who checked them last in Jan, they will f/u with her office

## 2013-08-06 NOTE — Telephone Encounter (Signed)
Patient advised.   He is agreeable.  Medication phoned to pharmacy.

## 2013-08-06 NOTE — Telephone Encounter (Signed)
Px written for call in   Please make sure pt is ok with that

## 2013-08-06 NOTE — Telephone Encounter (Signed)
Then I want to change to lipitor Px written for call in

## 2013-08-06 NOTE — Telephone Encounter (Signed)
Cardiologist advise pt we are handling pt's cholesterol med and we need to give pt alt medication, alt medication pt's insurance will cover is Lipitor, please advise

## 2013-08-10 ENCOUNTER — Other Ambulatory Visit (INDEPENDENT_AMBULATORY_CARE_PROVIDER_SITE_OTHER): Payer: BC Managed Care – PPO

## 2013-08-10 DIAGNOSIS — R5383 Other fatigue: Secondary | ICD-10-CM

## 2013-08-10 DIAGNOSIS — E669 Obesity, unspecified: Secondary | ICD-10-CM

## 2013-08-10 DIAGNOSIS — R5381 Other malaise: Secondary | ICD-10-CM

## 2013-08-10 DIAGNOSIS — I959 Hypotension, unspecified: Secondary | ICD-10-CM

## 2013-08-10 DIAGNOSIS — E119 Type 2 diabetes mellitus without complications: Secondary | ICD-10-CM

## 2013-08-10 LAB — COMPREHENSIVE METABOLIC PANEL
BUN: 18 mg/dL (ref 6–23)
CO2: 28 mEq/L (ref 19–32)
Calcium: 9.6 mg/dL (ref 8.4–10.5)
Chloride: 104 mEq/L (ref 96–112)
Creatinine, Ser: 0.9 mg/dL (ref 0.4–1.5)
GFR: 94.65 mL/min (ref >60.00)

## 2013-08-12 ENCOUNTER — Encounter: Payer: Self-pay | Admitting: *Deleted

## 2013-09-27 ENCOUNTER — Other Ambulatory Visit (HOSPITAL_COMMUNITY): Payer: Self-pay | Admitting: *Deleted

## 2013-09-27 ENCOUNTER — Other Ambulatory Visit: Payer: Self-pay | Admitting: Internal Medicine

## 2013-09-27 MED ORDER — CLOPIDOGREL BISULFATE 75 MG PO TABS: 75.0000 mg | ORAL_TABLET | Freq: Every day | ORAL | Status: DC

## 2013-09-28 ENCOUNTER — Other Ambulatory Visit: Payer: Self-pay | Admitting: *Deleted

## 2013-09-28 MED ORDER — METFORMIN HCL 1000 MG PO TABS: 1000.0000 mg | ORAL_TABLET | Freq: Two times a day (BID) | ORAL | Status: DC

## 2013-09-28 MED ORDER — RAMIPRIL 5 MG PO CAPS: 5.0000 mg | ORAL_CAPSULE | Freq: Every day | ORAL | Status: DC

## 2013-10-15 ENCOUNTER — Telehealth: Payer: Self-pay

## 2013-10-15 NOTE — Telephone Encounter (Signed)
Will need a more definitive dx than fatigue to change work schedule - if he thinks it is more heart related f/u with cardiology and if not f/u with me to discuss further

## 2013-10-15 NOTE — Telephone Encounter (Signed)
Pt needs letter for work so pt does not have to do on call hours during the night. Pt having to go to work at 2-3 am and pt is extremely tired. Pt is scheduled for CPX in 01/2014. Pt request cb if can get letter without appt.

## 2013-10-18 NOTE — Telephone Encounter (Signed)
Pt scheduled appt with you for 10/20/13 at 5:15

## 2013-10-20 ENCOUNTER — Encounter: Payer: Self-pay | Admitting: Family Medicine

## 2013-10-20 ENCOUNTER — Ambulatory Visit (INDEPENDENT_AMBULATORY_CARE_PROVIDER_SITE_OTHER): Payer: BC Managed Care – PPO | Admitting: Family Medicine

## 2013-10-20 VITALS — BP 130/72 | HR 71 | Temp 97.5°F | Ht 71.0 in | Wt 224.8 lb

## 2013-10-20 DIAGNOSIS — R5383 Other fatigue: Secondary | ICD-10-CM

## 2013-10-20 DIAGNOSIS — R5381 Other malaise: Secondary | ICD-10-CM

## 2013-10-20 NOTE — Patient Instructions (Signed)
I think your work schedule is the major reason for your extreme fatigue  Please try not to work outside the hours you are supposed to - turn off your phone at night and do not answer calls from work at any time you are not on call  Try your best to work on a diabetic diet and fit in some exercise Counseling is always available if you feel depressed

## 2013-10-20 NOTE — Progress Notes (Signed)
Subjective:    Patient ID: Harry Payne, male    DOB: 04-14-1951, 62 y.o.   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 Routine general medical examination at a health care facility 09/17/2012  . Colon cancer screening 09/17/2012  . Prostate cancer screening 09/17/2012  . Closed head injury 07/24/2012  . Obesity 03/25/2012  . Fatigue 12/23/2011  . B12 DEFICIENCY 11/07/2009  . COLONIC POLYPS, ADENOMATOUS, HX OF 04/04/2009  . DIABETES-TYPE 2 02/10/2008  . HYPERLIPIDEMIA 02/09/2008  . ANXIETY 02/09/2008  . MYOCARDIAL INFARCTION, HX OF 02/09/2008  . CORONARY ARTERY DISEASE 02/09/2008  . GERD 02/09/2008  . INSOMNIA 02/09/2008  . TOBACCO USE, QUIT 02/09/2008  . HYPOTENSION 02/03/2008   Past Medical History  Diagnosis Date  . Anxiety   . CAD (coronary artery disease)   . GERD (gastroesophageal reflux disease)   . Other and unspecified hyperlipidemia   . Depression   . Hypotension   . Hx of adenomatous colonic polyps   . Hemorrhoids    Past Surgical History  Procedure Laterality Date  . Closed reduction hand fracture  1990    left hand   History  Substance Use Topics  . Smoking status: Former Games developer  . Smokeless tobacco: Never Used     Comment: over 88yrs ago  . Alcohol Use: No    Family History  Problem Relation Age of Onset  . Prostate cancer Father   . Diabetes Brother   . Diabetes Brother    No Known Allergies Current Outpatient Prescriptions on File Prior to Visit  Medication Sig Dispense Refill  . aspirin 81 MG tablet Take 81 mg by mouth 2 (two) times daily.        Marland Kitchen atorvastatin (LIPITOR) 40 MG tablet Take 1 tablet (40 mg total) by mouth daily.  30 tablet  11  . carvedilol (COREG) 25 MG tablet Take 1 tablet (25 mg total) by mouth 2 (two) times daily with a meal.  60 tablet  11  . clopidogrel (PLAVIX) 75 MG tablet Take 1 tablet (75 mg total) by mouth daily.  30 tablet  11  . glucose blood (ONE TOUCH ULTRA TEST) test strip Check sugar once per day and prn for DM 250.0  100 each  0  . metFORMIN (GLUCOPHAGE) 1000 MG tablet Take 1 tablet (1,000 mg total) by mouth 2 (two) times daily with a meal.  60 tablet  5  . nitroGLYCERIN (NITROSTAT) 0.4 MG SL tablet Place 1 tablet (0.4 mg total) under the tongue every 5 (five) minutes as needed.  25 tablet  3  .  ramipril (ALTACE) 5 MG capsule Take 1 capsule (5 mg total) by mouth daily.  30 capsule  5  . vitamin B-12 (CYANOCOBALAMIN) 1000 MCG tablet Take 1,000 mcg by mouth daily.        . [DISCONTINUED] pantoprazole (PROTONIX) 40 MG tablet Take 1 tablet (40 mg total) by mouth daily.  30 tablet  11   No current facility-administered medications on file prior to visit.      Review of Systems Review of Systems  Constitutional: Negative for fever, appetite change,  and unexpected weight change. pos for fatigue Eyes: Negative for pain and visual disturbance.  Respiratory: Negative for cough and shortness of breath.   Cardiovascular: Negative for cp or palpitations    Gastrointestinal: Negative for nausea, diarrhea and constipation.  Genitourinary: Negative for urgency and frequency.  Skin: Negative for pallor or rash   Neurological: Negative for weakness, light-headedness, numbness and headaches.  Hematological: Negative  for adenopathy. Does not bruise/bleed easily.  Psychiatric/Behavioral: Negative for dysphoric mood. The patient is not nervous/anxious. Pos for stressors and not enough sleep        Objective:   Physical Exam  Constitutional: He appears well-developed and well-nourished. No distress.  obese and well appearing  Does seem fatigued   HENT:  Head: Normocephalic and atraumatic.  Eyes: Conjunctivae and EOM are normal. Pupils are equal, round, and reactive to light. No scleral icterus.  Neck: Normal range of motion. Neck supple. No JVD present. Carotid bruit is not present. No thyromegaly present.  Cardiovascular: Normal rate and regular rhythm.   Pulmonary/Chest: Effort normal and breath sounds normal. He has no rales.  No crackles   Abdominal: Soft. Bowel sounds are normal. He exhibits no distension, no abdominal bruit and no mass. There is no tenderness.  Musculoskeletal: He exhibits no edema.  Lymphadenopathy:    He has no cervical adenopathy.  Neurological: He is alert. He has normal reflexes.  No tremor   Skin: Skin is warm and dry. No pallor.  Psychiatric: His affect is blunt. His speech is delayed. He exhibits a depressed mood.  Pt seems depressed but denies it           Assessment & Plan:

## 2013-10-21 NOTE — Assessment & Plan Note (Signed)
Rev last labs and cardiac studies He does have chronic dz without change -no new organic cause for profound fatigue  I strongly suspect this is a work situation in which he is asked to work after hours without pay- causing fatigue and depressed mood I suggested he not take calls after hours or on weekends without pay , and suggested counseling (he declines) Will continue to update if not imp

## 2013-10-23 ENCOUNTER — Other Ambulatory Visit: Payer: Self-pay | Admitting: Internal Medicine

## 2013-12-02 ENCOUNTER — Other Ambulatory Visit: Payer: Self-pay | Admitting: Internal Medicine

## 2014-01-12 ENCOUNTER — Telehealth: Payer: Self-pay | Admitting: Cardiology

## 2014-01-12 NOTE — Telephone Encounter (Signed)
New problem   Pt is requesting another sleep study b/c he wasn't in compliance of the 1st one due to sickness. Please call pt.

## 2014-01-12 NOTE — Telephone Encounter (Signed)
To Dr Turner to advise 

## 2014-01-12 NOTE — Telephone Encounter (Signed)
Please get me his last OV note

## 2014-01-13 NOTE — Telephone Encounter (Signed)
This is not a Turner pt. We never saw Pt at [facility].

## 2014-01-24 ENCOUNTER — Encounter: Payer: Self-pay | Admitting: Internal Medicine

## 2014-01-24 ENCOUNTER — Ambulatory Visit (INDEPENDENT_AMBULATORY_CARE_PROVIDER_SITE_OTHER): Payer: BC Managed Care – PPO | Admitting: Internal Medicine

## 2014-01-24 ENCOUNTER — Telehealth: Payer: Self-pay

## 2014-01-24 VITALS — BP 120/84 | HR 61 | Temp 98.0°F | Wt 222.2 lb

## 2014-01-24 DIAGNOSIS — K625 Hemorrhage of anus and rectum: Secondary | ICD-10-CM

## 2014-01-24 LAB — CBC
HEMATOCRIT: 46.6 % (ref 39.0–52.0)
HEMOGLOBIN: 15.8 g/dL (ref 13.0–17.0)
MCHC: 34 g/dL (ref 30.0–36.0)
MCV: 95.5 fl (ref 78.0–100.0)
Platelets: 299 10*3/uL (ref 150.0–400.0)
RBC: 4.89 Mil/uL (ref 4.22–5.81)
RDW: 13.7 % (ref 11.5–14.6)
WBC: 8.8 10*3/uL (ref 4.5–10.5)

## 2014-01-24 NOTE — Patient Instructions (Signed)
Rectal Bleeding Rectal bleeding is when blood passes out of the anus. It is usually a sign that something is wrong. It may not be serious, but it should always be evaluated. Rectal bleeding may present as bright red blood or extremely dark stools. The color may range from dark red or maroon to black (like tar). It is important that the cause of rectal bleeding be identified so treatment can be started and the problem corrected. CAUSES   Hemorrhoids. These are enlarged (dilated) blood vessels or veins in the anal or rectal area.  Fistulas. Theseare abnormal, burrowing channels that usually run from inside the rectum to the skin around the anus. They can bleed.  Anal fissures. This is a tear in the tissue of the anus. Bleeding occurs with bowel movements.  Diverticulosis. This is a condition in which pockets or sacs project from the bowel wall. Occasionally, the sacs can bleed.  Diverticulitis. Thisis an infection involving diverticulosis of the colon.  Proctitis and colitis. These are conditions in which the rectum, colon, or both, can become inflamed and pitted (ulcerated).  Polyps and cancer. Polyps are non-cancerous (benign) growths in the colon that may bleed. Certain types of polyps turn into cancer.  Protrusion of the rectum. Part of the rectum can project from the anus and bleed.  Certain medicines.  Intestinal infections.  Blood vessel abnormalities. HOME CARE INSTRUCTIONS  Eat a high-fiber diet to keep your stool soft.  Limit activity.  Drink enough fluids to keep your urine clear or pale yellow.  Warm baths may be useful to soothe rectal pain.  Follow up with your caregiver as directed. SEEK IMMEDIATE MEDICAL CARE IF:  You develop increased bleeding.  You have black or dark red stools.  You vomit blood or material that looks like coffee grounds.  You have abdominal pain or tenderness.  You have a fever.  You feel weak, nauseous, or you faint.  You have  severe rectal pain or you are unable to have a bowel movement. MAKE SURE YOU:  Understand these instructions.  Will watch your condition.  Will get help right away if you are not doing well or get worse. Document Released: 06/07/2002 Document Revised: 03/09/2012 Document Reviewed: 06/02/2011 Exit[employer] Patient Information 2014 [employer].  

## 2014-01-24 NOTE — Progress Notes (Signed)
Subjective:    Patient ID: Harry Payne, male    DOB: [birth year], 63 y.o.   MRN: [id number]  HPI  Pt presents to the clinic today with c/o rectal bleeding.  This started 1 day ago. He noticed bright red blood running down his legs this am. He is having normal BM. He does not have to strain. He denies diarrhea, or abdominal pain/cramping. He is on Plavix. He thinks he has been taking 2 tabs of plavix daily x 2 weeks and just realized that he was supposed to be taking 1 tab daily. He then realized he was only taking 1 tab, the other tab in his box is zantac. He also takes 2 aspirins daily.  Review of Systems      Past Medical History  Diagnosis Date  . Anxiety   . CAD (coronary artery disease)   . GERD (gastroesophageal reflux disease)   . Other and unspecified hyperlipidemia   . Depression   . Hypotension   . Hx of adenomatous colonic polyps   . Hemorrhoids     Current Outpatient Prescriptions  Medication Sig Dispense Refill  . aspirin 81 MG tablet Take 81 mg by mouth 2 (two) times daily.        Marland Kitchen atorvastatin (LIPITOR) 40 MG tablet Take 1 tablet (40 mg total) by mouth daily.  30 tablet  11  . carvedilol (COREG) 25 MG tablet TAKE 1 TABLET BY MOUTH TWICE A DAY WITH A MEAL  60 tablet  6  . clopidogrel (PLAVIX) 75 MG tablet Take 1 tablet (75 mg total) by mouth daily.  30 tablet  11  . glucose blood (ONE TOUCH ULTRA TEST) test strip Check sugar once per day and prn for DM 250.0  100 each  0  . metFORMIN (GLUCOPHAGE) 1000 MG tablet Take 1 tablet (1,000 mg total) by mouth 2 (two) times daily with a meal.  60 tablet  5  . nitroGLYCERIN (NITROSTAT) 0.4 MG SL tablet Place 1 tablet (0.4 mg total) under the tongue every 5 (five) minutes as needed.  25 tablet  3  . ramipril (ALTACE) 5 MG capsule Take 1 capsule (5 mg total) by mouth daily.  30 capsule  5  . ranitidine (ZANTAC) 75 MG tablet Take 75 mg by mouth 2 (two) times daily.      . vitamin B-12 (CYANOCOBALAMIN) 1000 MCG tablet Take  1,000 mcg by mouth daily.        . [DISCONTINUED] pantoprazole (PROTONIX) 40 MG tablet Take 1 tablet (40 mg total) by mouth daily.  30 tablet  11   No current facility-administered medications for this visit.    No Known Allergies  Family History  Problem Relation Age of Onset  . Prostate cancer Father   . Diabetes Brother   . Diabetes Brother     History   Social History  . Marital Status: Married    Spouse Name: N/A    Number of Children: 3  . Years of Education: N/A   Occupational History  . 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 Smoking status: Former Research scientist (life sciences)  . Smokeless tobacco: Never Used     Comment: over 33yrs ago  . Alcohol Use: No  . Drug Use: No  . Sexual Activity: Not on file   Other Topics Concern  . Not on file   Social History Narrative  . No narrative on file     Constitutional: Denies fever, malaise, fatigue, headache or  abrupt weight changes.  Respiratory: Denies difficulty breathing, shortness of breath, cough or sputum production.   Cardiovascular: Denies chest pain, chest tightness, palpitations or swelling in the hands or feet.  Gastrointestinal: Pt reports blood in his stool. Denies abdominal pain, bloating, constipation, diarrhea.    No other specific complaints in a complete review of systems (except as listed in HPI above).  Objective:   Physical Exam   BP 120/84  Pulse 61  Temp(Src) 98 F (36.7 C) (Oral)  Wt 222 lb 4 oz (100.812 kg)  SpO2 98% Wt Readings from Last 3 Encounters:  01/24/14 222 lb 4 oz (100.812 kg)  10/20/13 224 lb 12 oz (101.946 kg)  07/09/13 216 lb 4 oz (98.09 kg)    General: Appears his stated age, obese but well developed, well nourished in NAD. Cardiovascular: Normal rate and rhythm. S1,S2 noted.  No murmur, rubs or gallops noted. No JVD or BLE edema. No carotid bruits noted. Pulmonary/Chest: Normal effort and positive vesicular breath sounds. No respiratory distress. No wheezes,  rales or ronchi noted.  Abdomen: Soft and nontender. Normal bowel sounds, no bruits noted. No distention or masses noted. Liver, spleen and kidneys non palpable. DRE: No external hemorrhoids noted. No internal fissure, hemorrhoid or polyps felt. Heme positive, but no BRB noted on glove.  BMET    Component Value Date/Time   NA 139 08/10/2013 0746   K 4.3 08/10/2013 0746   CL 104 08/10/2013 0746   CO2 28 08/10/2013 0746   GLUCOSE 143* 08/10/2013 0746   BUN 18 08/10/2013 0746   CREATININE 0.9 08/10/2013 0746   CALCIUM 9.6 08/10/2013 0746   GFRNONAA >60 02/19/2011 0510   GFRAA  Value: >60        The eGFR has been calculated using the MDRD equation. This calculation has not been validated in all clinical situations. eGFR's persistently <60 mL/min signify possible Chronic Kidney Disease. 02/19/2011 0510    Lipid Panel     Component Value Date/Time   CHOL 110 01/20/2013 0820   TRIG 103.0 01/20/2013 0820   HDL 31.10* 01/20/2013 0820   CHOLHDL 4 01/20/2013 0820   VLDL 20.6 01/20/2013 0820   LDLCALC 58 01/20/2013 0820    CBC    Component Value Date/Time   WBC 7.7 01/20/2013 0820   RBC 4.97 01/20/2013 0820   HGB 15.8 01/20/2013 0820   HCT 46.3 01/20/2013 0820   PLT 221.0 01/20/2013 0820   MCV 93.1 01/20/2013 0820   MCH 31.0 02/19/2011 0510   MCHC 34.2 01/20/2013 0820   RDW 13.6 01/20/2013 0820   LYMPHSABS 2.0 01/20/2013 0820   MONOABS 0.6 01/20/2013 0820   EOSABS 0.2 01/20/2013 0820   BASOSABS 0.0 01/20/2013 0820    Hgb A1C Lab Results  Component Value Date   HGBA1C 7.2* 08/10/2013        Assessment & Plan:   Rectal Bleeding:  ? Use of Plavix and Aspirin He is not constipated and does not have internal/external hemorrhiods Will check CBC today May need to go back to GI if bleeding continues  Will follow up after lab results, if bleeding gets worse, go to ER immediatelty

## 2014-01-24 NOTE — Telephone Encounter (Signed)
Pt left v/m requesting cb for bleeding; left v/m for pt to cb. When putting in phone note saw where pt is being seen now by Webb Silversmith NP. Left v/m for pt does not need to cb.

## 2014-01-24 NOTE — Progress Notes (Signed)
Pre-visit discussion using our clinic review tool. No additional management support is needed unless otherwise documented below in the visit note.  

## 2014-01-31 ENCOUNTER — Telehealth: Payer: Self-pay | Admitting: *Deleted

## 2014-01-31 DIAGNOSIS — K625 Hemorrhage of anus and rectum: Secondary | ICD-10-CM

## 2014-01-31 NOTE — Telephone Encounter (Signed)
I will do referral now  Will also make [facility] aware

## 2014-01-31 NOTE — Telephone Encounter (Signed)
Pt calling stating that he stopped the aspirin and he's still bleeding and it actually worse, pt would like appt with GI ASAP, please advise

## 2014-02-01 ENCOUNTER — Encounter: Payer: Self-pay | Admitting: Physician Assistant

## 2014-02-01 ENCOUNTER — Ambulatory Visit (INDEPENDENT_AMBULATORY_CARE_PROVIDER_SITE_OTHER): Payer: BC Managed Care – PPO | Admitting: Physician Assistant

## 2014-02-01 VITALS — BP 100/60 | HR 72 | Ht 71.0 in | Wt 224.0 lb

## 2014-02-01 DIAGNOSIS — K648 Other hemorrhoids: Secondary | ICD-10-CM

## 2014-02-01 DIAGNOSIS — K625 Hemorrhage of anus and rectum: Secondary | ICD-10-CM

## 2014-02-01 MED ORDER — HYDROCORTISONE ACETATE 25 MG RE SUPP: RECTAL | Status: DC

## 2014-02-01 NOTE — Patient Instructions (Signed)
We sent a prescription for Anusol HC Suppositories  At bedtime for 10 days.   Hold the Plavix 5-7-days then restart.  Call back if recurrent bleeding. We have given you a brochure on a NON-Surgical Hemorrhoid treatment that can be done in the office.  This is something you may consider.

## 2014-02-01 NOTE — Progress Notes (Signed)
Subjective:    Patient ID: Harry Payne, male    DOB: 10/06/1951, 63 y.o.   MRN: [id number]  HPI  Harry Payne is a pleasant 64 year old white male known to Dr. Carlean Purl. He had colonoscopy in September of 2011 with finding of one 3 mm polyp which was removed and passed consistent with a tubular adenoma. Exam was otherwise negative with exception of internal hemorrhoids. Patient also had a adenomatous polyp in 2006. He comes in today with one-week history of bright red blood which he says has scared him. He has been maintained on aspirin and Plavix over the past several years after undergoing stenting of the LAD and circumflex in 2004 at the time of an MI. He has had no subsequent cardiac issues. He also has history of anxiety, GERD, hyperlipidemia and diabetes mellitus. Patient says he has been only seeing blood with bowel movements and his bowel movements are generally somewhat irregular. He has no complaints of abdominal pain or discomfort and has not noticed any rectal pain or discomfort. He has had no changes in his bowel habits no excessive straining constipation etc. He says he has seen bright red blood on the tissue and in the commode and that the bowel movement itself appears normal. He was seen by primary care late last week and had a CBC done showing hemoglobin 15.8 hematocrit of 46.6.    Review of Systems  Constitutional: Negative.   HENT: Negative.   Eyes: Negative.   Respiratory: Negative.   Cardiovascular: Negative.   Gastrointestinal: Positive for anal bleeding.  Endocrine: Negative.   Genitourinary: Negative.   Musculoskeletal: Negative.   Allergic/Immunologic: Negative.   Neurological: Negative.   Hematological: Negative.   Psychiatric/Behavioral: Negative.    Outpatient Prescriptions Prior to Visit  Medication Sig Dispense Refill  . aspirin 81 MG tablet Take 81 mg by mouth 2 (two) times daily.        Marland Kitchen atorvastatin (LIPITOR) 40 MG tablet Take 1 tablet (40 mg total) by mouth  daily.  30 tablet  11  . carvedilol (COREG) 25 MG tablet TAKE 1 TABLET BY MOUTH TWICE A DAY WITH A MEAL  60 tablet  6  . clopidogrel (PLAVIX) 75 MG tablet Take 1 tablet (75 mg total) by mouth daily.  30 tablet  11  . glucose blood (ONE TOUCH ULTRA TEST) test strip Check sugar once per day and prn for DM 250.0  100 each  0  . metFORMIN (GLUCOPHAGE) 1000 MG tablet Take 1 tablet (1,000 mg total) by mouth 2 (two) times daily with a meal.  60 tablet  5  . nitroGLYCERIN (NITROSTAT) 0.4 MG SL tablet Place 1 tablet (0.4 mg total) under the tongue every 5 (five) minutes as needed.  25 tablet  3  . ramipril (ALTACE) 5 MG capsule Take 1 capsule (5 mg total) by mouth daily.  30 capsule  5  . ranitidine (ZANTAC) 75 MG tablet Take 75 mg by mouth 2 (two) times daily.      . vitamin B-12 (CYANOCOBALAMIN) 1000 MCG tablet Take 1,000 mcg by mouth daily.         No facility-administered medications prior to visit.   No Known Allergies Patient Active Problem List   Diagnosis Date Noted  . Rectal bleeding 01/31/2014  . Obesity 03/25/2012  . B12 DEFICIENCY 11/07/2009  . COLONIC POLYPS, ADENOMATOUS, HX OF 04/04/2009  . DIABETES-TYPE 2 02/10/2008  . HYPERLIPIDEMIA 02/09/2008  . ANXIETY 02/09/2008  . MYOCARDIAL INFARCTION, HX OF 02/09/2008  .  CORONARY ARTERY DISEASE 02/09/2008  . GERD 02/09/2008  . INSOMNIA 02/09/2008  . TOBACCO USE, QUIT 02/09/2008   History  Substance Use Topics  . Smoking status: Former Smoker    Types: Cigarettes    Quit date: 12/30/2001  . Smokeless tobacco: Never Used     Comment: over 44yrs ago  . Alcohol Use: No   family history includes Diabetes in his brother; Heart disease in his brother; Prostate cancer in his father.     Objective:   Physical Exam    well-developed white male in no acute distress, accompanied by his wife. Blood pressure 100/60, pulse 72 height 5 foot 11 weight 224. HEENT; nontraumatic normocephalic EOMI PERRLA sclera anicteric, Supple ;no JVD,  Cardiovascular ;regular rate and rhythm with S1-S2 no murmur or gallop, Pulmonary ;clear bilaterally, Abdomen ; soft nontender nondistended bowel sounds are active there is no palpable mass or hepatosplenomegaly, Rectal ;exam no external lesion noted on anoscopy he does have a friable internal hemorrhoid on the right no gross blood, Extremities; no clubbing cyanosis or edema skin warm and dry, Psych; mood and affect normal and appropriate        Assessment & Plan:  #38  63 year old male with one-week history of bright red blood per rectum and exam consistent with bleeding secondary to internal hemorrhoids. Bleeding is exacerbated by Plavix #2 history of adenomatous colon polyps last colonoscopy September 2011 plan for 5 year interval followup  #3 coronary artery disease status post MI 2004 with stents no subsequent cardiac issues #4 diabetes mellitus  #5 hyperlipidemia  Plan; Anusol-HC suppositories at bedtime x10 days then as needed Patient states that he is voluntarily taking Plavix and that was he was told by Dr. Missy Sabins  he did not have to stay on Plavix at this time. Will hold Plavix for 5-7 days then restart We discussed option of in office hemorrhoidal banding procedure per Dr. Carlean Purl and if he continues to have bleeding he will call back to schedule

## 2014-02-02 NOTE — Progress Notes (Signed)
Agree with Ms. Esterwood's assessment and plan. Konnie Noffsinger E. Josalyn Dettmann, MD, FACG   

## 2014-02-21 ENCOUNTER — Other Ambulatory Visit: Payer: Self-pay | Admitting: Family Medicine

## 2014-02-22 ENCOUNTER — Encounter: Payer: BC Managed Care – PPO | Admitting: Family Medicine

## 2014-03-11 ENCOUNTER — Ambulatory Visit (INDEPENDENT_AMBULATORY_CARE_PROVIDER_SITE_OTHER): Payer: 59 | Admitting: Family Medicine

## 2014-03-11 ENCOUNTER — Encounter: Payer: Self-pay | Admitting: Family Medicine

## 2014-03-11 VITALS — BP 104/60 | HR 67 | Temp 97.4°F | Ht 71.0 in | Wt 220.5 lb

## 2014-03-11 DIAGNOSIS — E119 Type 2 diabetes mellitus without complications: Secondary | ICD-10-CM

## 2014-03-11 DIAGNOSIS — E538 Deficiency of other specified B group vitamins: Secondary | ICD-10-CM

## 2014-03-11 DIAGNOSIS — Z8042 Family history of malignant neoplasm of prostate: Secondary | ICD-10-CM

## 2014-03-11 DIAGNOSIS — Z0000 Encounter for general adult medical examination without abnormal findings: Secondary | ICD-10-CM | POA: Insufficient documentation

## 2014-03-11 DIAGNOSIS — E785 Hyperlipidemia, unspecified: Secondary | ICD-10-CM

## 2014-03-11 LAB — CBC WITH DIFFERENTIAL/PLATELET
BASOS ABS: 0 10*3/uL (ref 0.0–0.1)
Basophils Relative: 0.3 % (ref 0.0–3.0)
EOS PCT: 2.4 % (ref 0.0–5.0)
Eosinophils Absolute: 0.2 10*3/uL (ref 0.0–0.7)
HEMATOCRIT: 45.1 % (ref 39.0–52.0)
Hemoglobin: 15.1 g/dL (ref 13.0–17.0)
LYMPHS ABS: 2.2 10*3/uL (ref 0.7–4.0)
LYMPHS PCT: 27.4 % (ref 12.0–46.0)
MCHC: 33.5 g/dL (ref 30.0–36.0)
MCV: 96.4 fl (ref 78.0–100.0)
MONOS PCT: 8.1 % (ref 3.0–12.0)
Monocytes Absolute: 0.7 10*3/uL (ref 0.1–1.0)
NEUTROS PCT: 61.8 % (ref 43.0–77.0)
Neutro Abs: 5 10*3/uL (ref 1.4–7.7)
PLATELETS: 255 10*3/uL (ref 150.0–400.0)
RBC: 4.68 Mil/uL (ref 4.22–5.81)
RDW: 13.5 % (ref 11.5–14.6)
WBC: 8 10*3/uL (ref 4.5–10.5)

## 2014-03-11 LAB — LIPID PANEL
CHOL/HDL RATIO: 5
Cholesterol: 130 mg/dL (ref 0–200)
HDL: 27.2 mg/dL — AB (ref >39.00)
LDL CALC: 85 mg/dL (ref 0–99)
TRIGLYCERIDES: 90 mg/dL (ref 0.0–149.0)
VLDL: 18 mg/dL (ref 0.0–40.0)

## 2014-03-11 LAB — COMPREHENSIVE METABOLIC PANEL
ALBUMIN: 4.2 g/dL (ref 3.5–5.2)
ALT: 35 U/L (ref 0–53)
AST: 23 U/L (ref 0–37)
Alkaline Phosphatase: 57 U/L (ref 39–117)
BILIRUBIN TOTAL: 1 mg/dL (ref 0.3–1.2)
BUN: 18 mg/dL (ref 6–23)
CO2: 27 meq/L (ref 19–32)
Calcium: 9.5 mg/dL (ref 8.4–10.5)
Chloride: 102 mEq/L (ref 96–112)
Creatinine, Ser: 1 mg/dL (ref 0.4–1.5)
GFR: 85.35 mL/min (ref >60.00)
GLUCOSE: 172 mg/dL — AB (ref 70–99)
POTASSIUM: 4.8 meq/L (ref 3.5–5.1)
SODIUM: 138 meq/L (ref 135–145)
TOTAL PROTEIN: 6.9 g/dL (ref 6.0–8.3)

## 2014-03-11 LAB — TSH: TSH: 0.86 u[IU]/mL (ref 0.35–5.50)

## 2014-03-11 LAB — VITAMIN B12: VITAMIN B 12: 210 pg/mL — AB (ref 211–911)

## 2014-03-11 LAB — PSA: PSA: 1.65 ng/mL (ref 0.10–4.00)

## 2014-03-11 LAB — HEMOGLOBIN A1C: Hgb A1c MFr Bld: 8.1 % — ABNORMAL HIGH (ref 4.6–6.5)

## 2014-03-11 MED ORDER — RAMIPRIL 5 MG PO CAPS: 5.0000 mg | ORAL_CAPSULE | Freq: Every day | ORAL | Status: DC

## 2014-03-11 NOTE — Assessment & Plan Note (Signed)
B12 level today Daily oral suppl

## 2014-03-11 NOTE — Assessment & Plan Note (Signed)
Lipids today Statin and diet  Known CAD

## 2014-03-11 NOTE — Assessment & Plan Note (Signed)
A1C today Long disc re: low glycemic diet and exercise Now that he quit his job -motivation is better  Is exercising now  He is thinking about going into a trial for DM med

## 2014-03-11 NOTE — Assessment & Plan Note (Signed)
Father had prostate ca psa today  Minimal symptoms of enl prostate-no change  Had recent DRE for a hemorrhoid eval

## 2014-03-11 NOTE — Assessment & Plan Note (Signed)
Reviewed health habits including diet and exercise and skin cancer prevention Reviewed appropriate screening tests for age  Also reviewed health mt list, fam hx and immunization status , as well as social and family history   See HPI Labs today  Def DRE since he just had one for hem eval

## 2014-03-11 NOTE — Progress Notes (Signed)
Pre visit review using our clinic review tool, if applicable. No additional management support is needed unless otherwise documented below in the visit note. 

## 2014-03-11 NOTE — Progress Notes (Signed)
Subjective:    Patient ID: Harry Payne, male    DOB: January 24, 1951, 63 y.o.   MRN: [id number]  HPI Here for health maintenance exam and to review chronic medical problems    Wt is down 4 lb with bmi of 30 He is working on that  [employer] the gym - went every day this week - he is a little sore  Also eating "somewhat healthy"  Recent hemorrhoids -had to be treated / bled a lot due to plavix Had a rectal exam   imms Td 7/09  He is done working - he retired / couldn't do it any more   Pneumovax- declines Flu -declines zostavax -declines  He is afraid of needles severely   colonosc 9/11- 5 year recall   Diabetes Home sugar results- really up and down   DM diet - fair- but does have days that he binges  Exercise - going to the gym now  Symptoms-none  A1C last  Lab Results  Component Value Date   HGBA1C 7.2* 08/10/2013   Due for a1c No problems with medications  Renal protection -ace Last eye exam -nl 6/14   Hyperlipidemia On lipitor  Due for a check   Hx of B12 def in the past   Mood - much better since quitting work   Family hx of prostate ca  Some frequent urination , some nights he has nocturia but not every night  Recent prostate exam with his hemorrhoid eval -stable per pt  psa today Lab Results  Component Value Date   PSA 1.86 01/20/2013   PSA 1.17 01/31/2005      Patient Active Problem List   Diagnosis Date Noted  . Routine general medical examination at a health care facility 03/11/2014  . Rectal bleeding 01/31/2014  . Obesity 03/25/2012  . B12 DEFICIENCY 11/07/2009  . COLONIC POLYPS, ADENOMATOUS, HX OF 04/04/2009  . DIABETES-TYPE 2 02/10/2008  . HYPERLIPIDEMIA 02/09/2008  . ANXIETY 02/09/2008  . MYOCARDIAL INFARCTION, HX OF 02/09/2008  . CORONARY ARTERY DISEASE 02/09/2008  . GERD 02/09/2008  . INSOMNIA 02/09/2008  . TOBACCO USE, QUIT 02/09/2008   Past Medical History  Diagnosis Date  . Anxiety   . CAD (coronary artery disease)   . GERD  (gastroesophageal reflux disease)   . Other and unspecified hyperlipidemia   . Depression   . Hypotension   . Hx of adenomatous colonic polyps   . Hemorrhoids   . DM (diabetes mellitus)    Past Surgical History  Procedure Laterality Date  . Closed reduction hand fracture Left 1990    hand  . Coronary angioplasty with stent placement      x 4   History  Substance Use Topics  . Smoking status: Former Smoker    Types: Cigarettes    Quit date: 12/30/2001  . Smokeless tobacco: Never Used     Comment: over 65yrs ago  . Alcohol Use: No   Family History  Problem Relation Age of Onset  . Prostate cancer Father   . Diabetes Brother     x3  . Heart disease Brother    No Known Allergies Current Outpatient Prescriptions on File Prior to Visit  Medication Sig Dispense Refill  . aspirin 81 MG tablet Take 81 mg by mouth daily.       Marland Kitchen atorvastatin (LIPITOR) 40 MG tablet Take 1 tablet (40 mg total) by mouth daily.  30 tablet  11  . carvedilol (COREG) 25 MG tablet TAKE 1 TABLET  BY MOUTH TWICE A DAY WITH A MEAL  60 tablet  6  . clopidogrel (PLAVIX) 75 MG tablet Take 1 tablet (75 mg total) by mouth daily.  30 tablet  11  . glucose blood (ONE TOUCH ULTRA TEST) test strip Check sugar once per day and prn for DM 250.0  100 each  0  . metFORMIN (GLUCOPHAGE) 1000 MG tablet Take 1 tablet (1,000 mg total) by mouth 2 (two) times daily with a meal.  60 tablet  5  . nitroGLYCERIN (NITROSTAT) 0.4 MG SL tablet Place 1 tablet (0.4 mg total) under the tongue every 5 (five) minutes as needed.  25 tablet  3  . ramipril (ALTACE) 5 MG capsule Take 1 capsule (5 mg total) by mouth daily.  30 capsule  5  . ranitidine (ZANTAC) 150 MG tablet TAKE 1 TABLET (150 MG TOTAL) BY MOUTH 2 (TWO) TIMES DAILY.  180 tablet  1  . vitamin B-12 (CYANOCOBALAMIN) 1000 MCG tablet Take 1,000 mcg by mouth daily.        . [DISCONTINUED] pantoprazole (PROTONIX) 40 MG tablet Take 1 tablet (40 mg total) by mouth daily.  30 tablet  11    No current facility-administered medications on file prior to visit.     Review of Systems Review of Systems  Constitutional: Negative for fever, appetite change,  and unexpected weight change.  Eyes: Negative for pain and visual disturbance.  Respiratory: Negative for cough and shortness of breath.   Cardiovascular: Negative for cp or palpitations    Gastrointestinal: Negative for nausea, diarrhea and constipation. neg for any more blood in stool  Genitourinary: Negative for urgency and frequency.  Skin: Negative for pallor or rash   Neurological: Negative for weakness, light-headedness, numbness and headaches.  Hematological: Negative for adenopathy. Does not bruise/bleed easily.  Psychiatric/Behavioral: Negative for dysphoric mood. The patient is not nervous/anxious.         Objective:   Physical Exam  Constitutional: He appears well-developed and well-nourished. No distress.  overwt and well app  HENT:  Head: Normocephalic and atraumatic.  Right Ear: External ear normal.  Left Ear: External ear normal.  Nose: Nose normal.  Mouth/Throat: Oropharynx is clear and moist.  Eyes: Conjunctivae and EOM are normal. Pupils are equal, round, and reactive to light. Right eye exhibits no discharge. Left eye exhibits no discharge. No scleral icterus.  Neck: Normal range of motion. Neck supple. No JVD present. Carotid bruit is not present. No thyromegaly present.  Cardiovascular: Normal rate, regular rhythm, normal heart sounds and intact distal pulses.  Exam reveals no gallop.   Pulmonary/Chest: Effort normal and breath sounds normal. No respiratory distress. He has no wheezes. He exhibits no tenderness.  Abdominal: Soft. Bowel sounds are normal. He exhibits no distension, no abdominal bruit and no mass. There is no tenderness.  Musculoskeletal: He exhibits no edema and no tenderness.  Lymphadenopathy:    He has no cervical adenopathy.  Neurological: He is alert. He has normal reflexes.  No cranial nerve deficit. He exhibits normal muscle tone. Coordination normal.  Skin: Skin is warm and dry. No rash noted. No erythema. No pallor.  Fair with some lentigo  Psychiatric: He has a normal mood and affect.          Assessment & Plan:

## 2014-03-11 NOTE — Patient Instructions (Signed)
Get back on track please with a diabetic diet  Keep exercising - I hope that helps you feel better  Labs today  Follow up in 6 months with labs prior

## 2014-04-14 ENCOUNTER — Ambulatory Visit (HOSPITAL_COMMUNITY)
Admission: RE | Admit: 2014-04-14 | Discharge: 2014-04-14 | Disposition: A | Discharge status: Home / Self Care | Payer: 59 | Source: Ambulatory Visit | Attending: Internal Medicine | Admitting: Internal Medicine

## 2014-04-14 ENCOUNTER — Encounter (HOSPITAL_COMMUNITY): Payer: Self-pay

## 2014-04-14 VITALS — BP 92/60 | HR 68 | Wt 223.5 lb

## 2014-04-14 DIAGNOSIS — I2510 Atherosclerotic heart disease of native coronary artery without angina pectoris: Secondary | ICD-10-CM

## 2014-04-14 DIAGNOSIS — R5383 Other fatigue: Secondary | ICD-10-CM | POA: Insufficient documentation

## 2014-04-14 DIAGNOSIS — E785 Hyperlipidemia, unspecified: Secondary | ICD-10-CM

## 2014-04-14 DIAGNOSIS — R5381 Other malaise: Secondary | ICD-10-CM

## 2014-04-14 DIAGNOSIS — N529 Male erectile dysfunction, unspecified: Secondary | ICD-10-CM

## 2014-04-14 MED ORDER — CARVEDILOL 12.5 MG PO TABS: 12.5000 mg | ORAL_TABLET | Freq: Two times a day (BID) | ORAL | Status: DC

## 2014-04-14 MED ORDER — SILDENAFIL CITRATE 100 MG PO TABS: 100.0000 mg | ORAL_TABLET | Freq: Every day | ORAL | Status: DC | PRN

## 2014-04-14 NOTE — Patient Instructions (Signed)
Decrease Carvedilol to 12.5 mg Twice daily, you can cut tabs in half until you run out then new prescription is for 12.5 mg tablets  Viagra 100 mg, take 1/2 tab as needed, WHEN YOU USE THIS MEDICATION DO NOT USE NITROGLYCERIN   We will contact you in 1 year to schedule your next appointment.

## 2014-04-14 NOTE — Progress Notes (Signed)
Patient ID: Harry Payne, male   DOB: [birth year], 63 y.o.   MRN: [id number]  PCP: Dr Alba Cory  HPI: Harry Payne is a 63 yo male with CAD s/p anterior MI 2004 (s/p stenting LAD and LCX), DM2, and hyperlipidemia (previously in AIM-HIGH trial). EF 48% by MR. Myoview 2009 EF 43%. anterior apical scar. no ischemia.   He presented to [facility]/16/11 with chest pain.  He ruled out for myocardial infarction.  Cardiac catheterization done 02/18/2011: Proximal LAD stent patent; midcircumflex 25%, circumflex stent patent; RCA 25% of the PDA, proximal PDA 25%; posterior lateral 25-30%; EF 50% with anterior dyskinesis.  Given the non-obstructive nature of his anatomy, it was felt that his chest pain was noncardiac.   He returns for yearly follow up. Says over the past several months has had increasing fatigue. Decided to quit work because by [employer] he is exhausted. Has joined a gym and walking on TM for 30 mins (3.5 mph no incline) without too much difficulty. Gets CP 3-4x/week. Sharp pain then goes away quickly. Does not take NTG. Struggling with ED. Has not used PDE-5 inhibitor.  Gets dizzy when he gets up too quickly.    ROS: All systems negative except as listed in HPI, PMH and Problem List.  Past Medical History  Diagnosis Date  . Anxiety   . CAD (coronary artery disease)   . GERD (gastroesophageal reflux disease)   . Other and unspecified hyperlipidemia   . Depression   . Hypotension   . Hx of adenomatous colonic polyps   . Hemorrhoids   . DM (diabetes mellitus)     Current Outpatient Prescriptions  Medication Sig Dispense Refill  . aspirin 81 MG tablet Take 81 mg by mouth daily.       Marland Kitchen atorvastatin (LIPITOR) 40 MG tablet Take 1 tablet (40 mg total) by mouth daily.  30 tablet  11  . carvedilol (COREG) 25 MG tablet TAKE 1 TABLET BY MOUTH TWICE A DAY WITH A MEAL  60 tablet  6  . clopidogrel (PLAVIX) 75 MG tablet Take 1 tablet (75 mg total) by mouth daily.  30 tablet  11  . glucose  blood (ONE TOUCH ULTRA TEST) test strip Check sugar once per day and prn for DM 250.0  100 each  0  . metFORMIN (GLUCOPHAGE) 1000 MG tablet Take 1 tablet (1,000 mg total) by mouth 2 (two) times daily with a meal.  60 tablet  5  . nitroGLYCERIN (NITROSTAT) 0.4 MG SL tablet Place 1 tablet (0.4 mg total) under the tongue every 5 (five) minutes as needed.  25 tablet  3  . ramipril (ALTACE) 5 MG capsule Take 1 capsule (5 mg total) by mouth daily.  30 capsule  11  . ranitidine (ZANTAC) 150 MG tablet TAKE 1 TABLET (150 MG TOTAL) BY MOUTH 2 (TWO) TIMES DAILY.  180 tablet  1  . vitamin B-12 (CYANOCOBALAMIN) 1000 MCG tablet Take 1,000 mcg by mouth daily.        . [DISCONTINUED] pantoprazole (PROTONIX) 40 MG tablet Take 1 tablet (40 mg total) by mouth daily.  30 tablet  11   No current facility-administered medications for this encounter.     PHYSICAL EXAM: Filed Vitals:   04/14/14 0943  BP: 92/60  Pulse: 68   General:  Fatigued appearing. No resp difficulty HEENT: normal Neck: supple. JVP flat. Carotids 2+ bilaterally; no bruits. No lymphadenopathy or thryomegaly appreciated. Cor: PMI normal. Regular rate & rhythm. No rubs, gallops  or murmurs. Lungs: clear Abdomen: soft, nontender, nondistended. No hepatosplenomegaly. No bruits or masses. Good bowel sounds. Extremities: no cyanosis, clubbing, rash, edema Neuro: alert & orientedx3, cranial nerves grossly intact. Moves all 4 extremities w/o difficulty. Affect pleasant.   ECG: NSR 66.  Anteroseptal qs (old) No ST-T wave abnormalities.    ASSESSMENT & PLAN: 1. CAD s/p previous anterior MI 2. 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 Hyperlipidemia 4. Fatigue 5. Erectile dysfunction  Overall fairly stable. However BP is quite low. I think he is over b-blocked. Will cut carvedilol to 12.5 bid and see how he feels. I do not think CP sounds anginal at this point and cath in 2012 was reassuring. Will continue to follow. If CP getting worse will need further f/u. Dr.  Glori Bickers is following lipids. Continue lipitor with goal LDL < 70. Will try sildenafil for ED. Made it clear never take NTG when sildenafil on board.   Daniel R Bensimhon,MD 10:09 AM

## 2014-04-14 NOTE — Addendum Note (Signed)
Encounter addended by: Scarlette Calico, RN on: 04/14/2014 10:19 AM<BR>     Documentation filed: Patient Instructions Section, Orders

## 2014-06-13 LAB — HM DIABETES EYE EXAM

## 2014-06-29 DEATH — deceased

## 2014-07-23 ENCOUNTER — Other Ambulatory Visit: Payer: Self-pay | Admitting: Family Medicine

## 2014-08-05 ENCOUNTER — Other Ambulatory Visit: Payer: Self-pay | Admitting: Family Medicine

## 2014-08-23 ENCOUNTER — Other Ambulatory Visit: Payer: Self-pay | Admitting: Family Medicine

## 2014-09-05 ENCOUNTER — Telehealth: Payer: Self-pay | Admitting: Family Medicine

## 2014-09-05 DIAGNOSIS — E119 Type 2 diabetes mellitus without complications: Secondary | ICD-10-CM

## 2014-09-05 NOTE — Telephone Encounter (Signed)
Message copied by Abner Greenspan on Mon Sep 05, 2014 11:25 AM ------      Message from: Ellamae Sia      Created: Mon Aug 29, 2014  3:45 PM      Regarding: Lab orders for Tuesday, 9.8.15       Lab orders for a 6 month f/u, to include A1C for diabetic bundle ------

## 2014-09-06 ENCOUNTER — Other Ambulatory Visit (INDEPENDENT_AMBULATORY_CARE_PROVIDER_SITE_OTHER): Payer: 59

## 2014-09-06 DIAGNOSIS — E119 Type 2 diabetes mellitus without complications: Secondary | ICD-10-CM

## 2014-09-06 LAB — HEMOGLOBIN A1C: Hgb A1c MFr Bld: 8.1 % — ABNORMAL HIGH (ref 4.6–6.5)

## 2014-09-07 ENCOUNTER — Other Ambulatory Visit (HOSPITAL_COMMUNITY): Payer: Self-pay | Admitting: Family Medicine

## 2014-09-07 NOTE — Telephone Encounter (Signed)
Please refill times one  

## 2014-09-07 NOTE — Telephone Encounter (Signed)
Electronic refill request, please advise  

## 2014-09-12 ENCOUNTER — Other Ambulatory Visit: Payer: Self-pay | Admitting: Family Medicine

## 2014-09-12 ENCOUNTER — Ambulatory Visit (INDEPENDENT_AMBULATORY_CARE_PROVIDER_SITE_OTHER): Payer: 59 | Admitting: Family Medicine

## 2014-09-12 ENCOUNTER — Encounter: Payer: Self-pay | Admitting: Family Medicine

## 2014-09-12 VITALS — BP 106/70 | HR 66 | Temp 97.7°F | Ht 71.0 in | Wt 225.2 lb

## 2014-09-12 DIAGNOSIS — E119 Type 2 diabetes mellitus without complications: Secondary | ICD-10-CM

## 2014-09-12 MED ORDER — GLIPIZIDE ER 5 MG PO TB24: 5.0000 mg | ORAL_TABLET | Freq: Every day | ORAL | Status: DC

## 2014-09-12 NOTE — Progress Notes (Signed)
Subjective:    Patient ID: Harry Payne, male    DOB: [birth year], 63 y.o.   MRN: [id number]  HPI Here for f/u of chronic medical problems  Diabetes Home sugar results - running about 140 average both in am and after a meal  DM diet he does "stray" from his diet (will eat sweets when he shops) Exercise he works 3 days per week- some of those days are physically demanding- but not going to the gym  Symptoms-none/ feels fine  A1C last  Lab Results  Component Value Date   HGBA1C 8.1* 09/06/2014   No problems with medications  Renal protection- ace  Last eye exam - June of 2015   He knows he "can" get back to DM diet and exercise   Had a low bp and his cardiologist dropped his coreg - so he went back to it    Wt is up 2 lb with bmi of 31  He declines all immunizations due to fear of shots   Patient Active Problem List   Diagnosis Date Noted  . Fatigue 04/14/2014  . Erectile dysfunction 04/14/2014  . Routine general medical examination at a health care facility 03/11/2014  . Family history of prostate cancer 03/11/2014  . Rectal bleeding 01/31/2014  . Obesity 03/25/2012  . B12 DEFICIENCY 11/07/2009  . COLONIC POLYPS, ADENOMATOUS, HX OF 04/04/2009  . DIABETES-TYPE 2 02/10/2008  . HYPERLIPIDEMIA 02/09/2008  . ANXIETY 02/09/2008  . MYOCARDIAL INFARCTION, HX OF 02/09/2008  . CORONARY ARTERY DISEASE 02/09/2008  . GERD 02/09/2008  . INSOMNIA 02/09/2008  . TOBACCO USE, QUIT 02/09/2008   Past Medical History  Diagnosis Date  . Anxiety   . CAD (coronary artery disease)   . GERD (gastroesophageal reflux disease)   . Other and unspecified hyperlipidemia   . Depression   . Hypotension   . Hx of adenomatous colonic polyps   . Hemorrhoids   . DM (diabetes mellitus)    Past Surgical History  Procedure Laterality Date  . Closed reduction hand fracture Left 1990    hand  . Coronary angioplasty with stent placement      x 4   History  Substance Use Topics  . Smoking  status: Former Smoker    Types: Cigarettes    Quit date: 12/30/2001  . Smokeless tobacco: Never Used     Comment: over 11yrs ago  . Alcohol Use: No   Family History  Problem Relation Age of Onset  . Prostate cancer Father   . Diabetes Brother     x3  . Heart disease Brother    No Known Allergies Current Outpatient Prescriptions on File Prior to Visit  Medication Sig Dispense Refill  . aspirin 81 MG tablet Take 81 mg by mouth daily.       Marland Kitchen atorvastatin (LIPITOR) 40 MG tablet TAKE 1 TABLET (40 MG TOTAL) BY MOUTH DAILY.  30 tablet  5  . clopidogrel (PLAVIX) 75 MG tablet Take 1 tablet (75 mg total) by mouth daily.  30 tablet  11  . glucose blood (ONE TOUCH ULTRA TEST) test strip Check sugar once per day and prn for DM 250.0  100 each  0  . metFORMIN (GLUCOPHAGE) 1000 MG tablet Take 1 tablet (1,000 mg total) by mouth 2 (two) times daily with a meal.  60 tablet  5  . NITROSTAT 0.4 MG SL tablet PLACE 1 TABLET (0.4 MG TOTAL) UNDER THE TONGUE EVERY 5 (FIVE) MINUTES AS NEEDED.  25 tablet  0  . ramipril (ALTACE) 5 MG capsule Take 1 capsule (5 mg total) by mouth daily.  30 capsule  11  . ranitidine (ZANTAC) 150 MG tablet TAKE 1 TABLET (150 MG TOTAL) BY MOUTH 2 (TWO) TIMES DAILY.  180 tablet  0  . vitamin B-12 (CYANOCOBALAMIN) 1000 MCG tablet Take 1,000 mcg by mouth daily.        . [DISCONTINUED] pantoprazole (PROTONIX) 40 MG tablet Take 1 tablet (40 mg total) by mouth daily.  30 tablet  11   No current facility-administered medications on file prior to visit.     Review of Systems Review of Systems  Constitutional: Negative for fever, appetite change, fatigue and unexpected weight change.  Eyes: Negative for pain and visual disturbance.  Respiratory: Negative for cough and shortness of breath.   Cardiovascular: Negative for cp or palpitations    Gastrointestinal: Negative for nausea, diarrhea and constipation.  Genitourinary: Negative for urgency and frequency.  Skin: Negative for pallor  or rash   Neurological: Negative for weakness, light-headedness, numbness and headaches.  Hematological: Negative for adenopathy. Does not bruise/bleed easily.  Psychiatric/Behavioral: Negative for dysphoric mood. The patient is not nervous/anxious.         Objective:   Physical Exam  Constitutional: He appears well-developed and well-nourished. No distress.  obese and well appearing   HENT:  Head: Normocephalic and atraumatic.  Mouth/Throat: Oropharynx is clear and moist.  Eyes: Conjunctivae and EOM are normal. Pupils are equal, round, and reactive to light. Right eye exhibits no discharge. Left eye exhibits no discharge. No scleral icterus.  Neck: Normal range of motion. Neck supple. No JVD present. Carotid bruit is not present. No thyromegaly present.  Cardiovascular: Normal rate, regular rhythm, normal heart sounds and intact distal pulses.  Exam reveals no gallop.   Pulmonary/Chest: Effort normal and breath sounds normal. No respiratory distress. He has no wheezes. He has no rales.  No crackles   Musculoskeletal: He exhibits no edema.  Lymphadenopathy:    He has no cervical adenopathy.  Neurological: He is alert. He has normal reflexes. No cranial nerve deficit. He exhibits normal muscle tone. Coordination normal.  Skin: Skin is warm and dry. No rash noted. No erythema. No pallor.  Psychiatric: He has a normal mood and affect.          Assessment & Plan:   Problem List Items Addressed This Visit     Endocrine   DIABETES-TYPE 2 - Primary      No improvement -  Lab Results  Component Value Date   HGBA1C 8.1* 09/06/2014    Pt confesses to binge eating at night and not exercising  Also 2 lb wt gain Not as motivated at this time Long disc re: lifestyle change  Will add glipizide er 5 mg daily- watching glucose at least TID and watching for hypoglycemia  Will update if side eff or problems F/u 3 mo after labs  Pt still declines all immunizations     Relevant  Medications      glipiZIDE (GLUCOTROL XL) 24 hr tablet   Other Relevant Orders      Hemoglobin A1c      Basic metabolic panel

## 2014-09-12 NOTE — Patient Instructions (Addendum)
Your diabetes is not well controlled  Please get back to diabetic diet - and stop binging while watching TV Check glucose am fasting/ 2 hours after a meal and and at bedtime  Start glipizide 5 mg in am  Continue metformin  If low sugar or any problems please let me know   Follow up in 3 months with labs prior

## 2014-09-12 NOTE — Progress Notes (Signed)
Pre visit review using our clinic review tool, if applicable. No additional management support is needed unless otherwise documented below in the visit note. 

## 2014-09-12 NOTE — Assessment & Plan Note (Signed)
No improvement -  Lab Results  Component Value Date   HGBA1C 8.1* 09/06/2014    Pt confesses to binge eating at night and not exercising  Also 2 lb wt gain Not as motivated at this time Long disc re: lifestyle change  Will add glipizide er 5 mg daily- watching glucose at least TID and watching for hypoglycemia  Will update if side eff or problems F/u 3 mo after labs  Pt still declines all immunizations

## 2014-10-13 ENCOUNTER — Other Ambulatory Visit (HOSPITAL_COMMUNITY): Payer: Self-pay | Admitting: Family Medicine

## 2014-10-13 ENCOUNTER — Telehealth (HOSPITAL_COMMUNITY): Payer: Self-pay | Admitting: Vascular Surgery

## 2014-10-13 ENCOUNTER — Other Ambulatory Visit: Payer: Self-pay

## 2014-10-13 DIAGNOSIS — I2510 Atherosclerotic heart disease of native coronary artery without angina pectoris: Secondary | ICD-10-CM

## 2014-10-13 MED ORDER — CLOPIDOGREL BISULFATE 75 MG PO TABS: ORAL_TABLET | ORAL | Status: DC

## 2014-10-13 NOTE — Telephone Encounter (Signed)
Pt need a new prescription sent to [facility] for Plavix pt hasn't had it 4 day please advise

## 2014-10-21 ENCOUNTER — Other Ambulatory Visit: Payer: Self-pay | Admitting: Family Medicine

## 2014-11-01 ENCOUNTER — Other Ambulatory Visit (HOSPITAL_COMMUNITY): Payer: Self-pay | Admitting: Family Medicine

## 2014-11-01 NOTE — Telephone Encounter (Signed)
done

## 2014-11-01 NOTE — Telephone Encounter (Signed)
Please refill times 5 

## 2014-11-01 NOTE — Telephone Encounter (Signed)
Electronic refill request, please advise  

## 2014-11-05 ENCOUNTER — Other Ambulatory Visit: Payer: Self-pay | Admitting: Family Medicine

## 2014-11-20 ENCOUNTER — Other Ambulatory Visit: Payer: Self-pay | Admitting: Family Medicine

## 2014-12-05 ENCOUNTER — Other Ambulatory Visit (INDEPENDENT_AMBULATORY_CARE_PROVIDER_SITE_OTHER): Payer: 59

## 2014-12-05 DIAGNOSIS — E119 Type 2 diabetes mellitus without complications: Secondary | ICD-10-CM

## 2014-12-05 LAB — BASIC METABOLIC PANEL
BUN: 17 mg/dL (ref 6–23)
CO2: 29 mEq/L (ref 19–32)
Calcium: 9.5 mg/dL (ref 8.4–10.5)
Chloride: 105 mEq/L (ref 96–112)
Creatinine, Ser: 1 mg/dL (ref 0.4–1.5)
GFR: 80.25 mL/min (ref >60.00)
Glucose, Bld: 172 mg/dL — ABNORMAL HIGH (ref 70–99)
POTASSIUM: 4.4 meq/L (ref 3.5–5.1)
Sodium: 140 mEq/L (ref 135–145)

## 2014-12-05 LAB — HEMOGLOBIN A1C: Hgb A1c MFr Bld: 7.5 % — ABNORMAL HIGH (ref 4.6–6.5)

## 2014-12-08 ENCOUNTER — Other Ambulatory Visit (HOSPITAL_COMMUNITY): Payer: Self-pay | Admitting: Family Medicine

## 2014-12-12 ENCOUNTER — Encounter: Payer: Self-pay | Admitting: Family Medicine

## 2014-12-12 ENCOUNTER — Ambulatory Visit (INDEPENDENT_AMBULATORY_CARE_PROVIDER_SITE_OTHER): Payer: 59 | Admitting: Family Medicine

## 2014-12-12 VITALS — BP 112/62 | HR 68 | Temp 97.6°F | Ht 71.0 in | Wt 231.5 lb

## 2014-12-12 DIAGNOSIS — E1165 Type 2 diabetes mellitus with hyperglycemia: Secondary | ICD-10-CM

## 2014-12-12 DIAGNOSIS — IMO0001 Reserved for inherently not codable concepts without codable children: Secondary | ICD-10-CM

## 2014-12-12 NOTE — Patient Instructions (Addendum)
Continue current medicines  Start working out as planned for weight loss and better sugar control  Also when you are ready-please get back to a diabetic diet   Keep track of glucose and if they get too low with diet and exercise let me know   Follow up in 3 months with labs prior

## 2014-12-12 NOTE — Progress Notes (Signed)
Subjective:    Patient ID: Harry Payne, male    DOB: [birth year], 63 y.o.   MRN: [id number]  HPI Here for f/u of DM 2  Is feeling pretty good   Wt is up 6 lb with bmi of 32  He realized he is not exercising enough and still binge eating  He is "doing too many things for too many people"    Diabetes- added glipizide ER 5 mg last visit  Home sugar results - had one low sugar the first day  DM diet -not optimal  Exercise -not optimal Symptoms=none  A1C last  Lab Results  Component Value Date   HGBA1C 7.5* 12/05/2014  this is down from 8.1   No problems with medications  Renal protection- Last eye exam had in June   Patient Active Problem List   Diagnosis Date Noted  . Fatigue 04/14/2014  . Erectile dysfunction 04/14/2014  . Routine general medical examination at a health care facility 03/11/2014  . Family history of prostate cancer 03/11/2014  . Rectal bleeding 01/31/2014  . Obesity 03/25/2012  . B12 DEFICIENCY 11/07/2009  . COLONIC POLYPS, ADENOMATOUS, HX OF 04/04/2009  . Diabetes mellitus type 2, uncontrolled, without complications 00/93/8182  . HYPERLIPIDEMIA 02/09/2008  . ANXIETY 02/09/2008  . MYOCARDIAL INFARCTION, HX OF 02/09/2008  . CORONARY ARTERY DISEASE 02/09/2008  . GERD 02/09/2008  . INSOMNIA 02/09/2008  . TOBACCO USE, QUIT 02/09/2008   Past Medical History  Diagnosis Date  . Anxiety   . CAD (coronary artery disease)   . GERD (gastroesophageal reflux disease)   . Other and unspecified hyperlipidemia   . Depression   . Hypotension   . Hx of adenomatous colonic polyps   . Hemorrhoids   . DM (diabetes mellitus)    Past Surgical History  Procedure Laterality Date  . Closed reduction hand fracture Left 1990    hand  . Coronary angioplasty with stent placement      x 4   History  Substance Use Topics  . Smoking status: Former Smoker    Types: Cigarettes    Quit date: 12/30/2001  . Smokeless tobacco: Never Used     Comment: over 68yrs ago   . Alcohol Use: No   Family History  Problem Relation Age of Onset  . Prostate cancer Father   . Diabetes Brother     x3  . Heart disease Brother    No Known Allergies Current Outpatient Prescriptions on File Prior to Visit  Medication Sig Dispense Refill  . aspirin 81 MG tablet Take 81 mg by mouth daily.     Marland Kitchen atorvastatin (LIPITOR) 40 MG tablet TAKE 1 TABLET (40 MG TOTAL) BY MOUTH DAILY. 30 tablet 5  . carvedilol (COREG) 25 MG tablet TAKE 1 TABLET (25 MG TOTAL) BY MOUTH 2 (TWO) TIMES DAILY WITH A MEAL. 60 tablet 3  . clopidogrel (PLAVIX) 75 MG tablet TAKE 1 TABLET (75 MG TOTAL) BY MOUTH DAILY. 30 tablet 1  . glipiZIDE (GLUCOTROL XL) 5 MG 24 hr tablet Take 1 tablet (5 mg total) by mouth daily with breakfast. 30 tablet 3  . glucose blood (ONE TOUCH ULTRA TEST) test strip CHECK BLOOD SUGAR ONCE DAY AS NEEDED FOR DM E11.9 100 each 0  . metFORMIN (GLUCOPHAGE) 1000 MG tablet TAKE 1 TABLET (1,000 MG TOTAL) BY MOUTH 2 (TWO) TIMES DAILY WITH A MEAL. 60 tablet 2  . NITROSTAT 0.4 MG SL tablet PLACE 1 TABLET (0.4 MG TOTAL) UNDER THE TONGUE EVERY 5 (  FIVE) MINUTES AS NEEDED. 25 tablet 4  . ramipril (ALTACE) 5 MG capsule Take 1 capsule (5 mg total) by mouth daily. 30 capsule 11  . ranitidine (ZANTAC) 150 MG tablet TAKE 1 TABLET (150 MG TOTAL) BY MOUTH 2 (TWO) TIMES DAILY. 180 tablet 1  . vitamin B-12 (CYANOCOBALAMIN) 1000 MCG tablet Take 1,000 mcg by mouth daily.      . [DISCONTINUED] pantoprazole (PROTONIX) 40 MG tablet Take 1 tablet (40 mg total) by mouth daily. 30 tablet 11   No current facility-administered medications on file prior to visit.        Review of Systems Review of Systems  Constitutional: Negative for fever, appetite change, fatigue and unexpected weight change.  Eyes: Negative for pain and visual disturbance.  Respiratory: Negative for cough and shortness of breath.   Cardiovascular: Negative for cp or palpitations    Gastrointestinal: Negative for nausea, diarrhea and  constipation.  Genitourinary: Negative for urgency and frequency.  Skin: Negative for pallor or rash   Neurological: Negative for weakness, light-headedness, numbness and headaches.  Hematological: Negative for adenopathy. Does not bruise/bleed easily.  Psychiatric/Behavioral: Negative for dysphoric mood. The patient is not nervous/anxious.         Objective:   Physical Exam  Constitutional: He appears well-developed and well-nourished. No distress.  HENT:  Head: Normocephalic and atraumatic.  Mouth/Throat: Oropharynx is clear and moist.  Eyes: Conjunctivae and EOM are normal. Pupils are equal, round, and reactive to light. No scleral icterus.  Neck: Normal range of motion. Neck supple. No JVD present. Carotid bruit is not present. No thyromegaly present.  Cardiovascular: Normal rate, regular rhythm, normal heart sounds and intact distal pulses.  Exam reveals no gallop.   Pulmonary/Chest: Effort normal and breath sounds normal. No respiratory distress. He has no wheezes. He exhibits no tenderness.  Abdominal: Soft. Bowel sounds are normal. He exhibits no distension, no abdominal bruit and no mass. There is no tenderness.  Musculoskeletal: He exhibits no edema or tenderness.  Lymphadenopathy:    He has no cervical adenopathy.  Neurological: He is alert. He has normal reflexes. No cranial nerve deficit. He exhibits normal muscle tone. Coordination normal.  Skin: Skin is warm and dry. No rash noted. No erythema. No pallor.  Psychiatric: He has a normal mood and affect.          Assessment & Plan:   Problem List Items Addressed This Visit      Other   Diabetes mellitus type 2, uncontrolled, without complications - Primary    Lab Results  Component Value Date   HGBA1C 7.5* 12/05/2014   Improved with glipizide but still not optimal diet and exercise Long disc of this ? Not motivated and pt unsure why (denies depression) He resolved to work out every am at 5:30 and make time for  meal planning (has done DM ed) F/u 3 mo with lab prior  He does continue to decline immunizations     Relevant Orders      Hemoglobin A1c      Lipid panel      Comprehensive metabolic panel

## 2014-12-12 NOTE — Assessment & Plan Note (Signed)
Lab Results  Component Value Date   HGBA1C 7.5* 12/05/2014   Improved with glipizide but still not optimal diet and exercise Long disc of this ? Not motivated and pt unsure why (denies depression) He resolved to work out every am at 5:30 and make time for meal planning (has done DM ed) F/u 3 mo with lab prior  He does continue to decline immunizations

## 2014-12-16 NOTE — Telephone Encounter (Signed)
See prev note. Last Rx came from cardiologist, please advise

## 2014-12-16 NOTE — Telephone Encounter (Signed)
Patient's wife called and asked for you to call patient about him medication.  Patient is out of medication.  Please call patient at [phone].

## 2014-12-16 NOTE — Telephone Encounter (Signed)
I think that comes from cardiology- but go ahead and refill times one to get him by

## 2014-12-16 NOTE — Telephone Encounter (Signed)
Done, and left voicemail for pt letting him know we refilled it x 1 month but he has to f/u with cardiologist for future refills

## 2014-12-16 NOTE — Telephone Encounter (Signed)
Harry Payne left v/m requesting status of plavix refill; Pt is out of medication;unable to reach pt or pts wife by phone.Please advise.

## 2014-12-19 ENCOUNTER — Other Ambulatory Visit: Payer: Self-pay

## 2014-12-19 MED ORDER — CLOPIDOGREL BISULFATE 75 MG PO TABS: ORAL_TABLET | ORAL | Status: DC

## 2015-01-08 ENCOUNTER — Other Ambulatory Visit: Payer: Self-pay | Admitting: Family Medicine

## 2015-01-19 ENCOUNTER — Other Ambulatory Visit: Payer: Self-pay | Admitting: Family Medicine

## 2015-01-22 ENCOUNTER — Other Ambulatory Visit: Payer: Self-pay | Admitting: Family Medicine

## 2015-02-04 ENCOUNTER — Other Ambulatory Visit: Payer: Self-pay | Admitting: Family Medicine

## 2015-02-05 ENCOUNTER — Other Ambulatory Visit: Payer: Self-pay | Admitting: Family Medicine

## 2015-03-06 ENCOUNTER — Other Ambulatory Visit (INDEPENDENT_AMBULATORY_CARE_PROVIDER_SITE_OTHER): Payer: 59

## 2015-03-06 DIAGNOSIS — E1165 Type 2 diabetes mellitus with hyperglycemia: Secondary | ICD-10-CM

## 2015-03-06 DIAGNOSIS — IMO0001 Reserved for inherently not codable concepts without codable children: Secondary | ICD-10-CM

## 2015-03-06 LAB — LIPID PANEL
CHOLESTEROL: 148 mg/dL (ref 0–200)
HDL: 28.2 mg/dL — AB (ref >39.00)
LDL Cholesterol: 89 mg/dL (ref 0–99)
NONHDL: 119.8
TRIGLYCERIDES: 155 mg/dL — AB (ref 0.0–149.0)
Total CHOL/HDL Ratio: 5
VLDL: 31 mg/dL (ref 0.0–40.0)

## 2015-03-06 LAB — HEMOGLOBIN A1C: Hgb A1c MFr Bld: 7.3 % — ABNORMAL HIGH (ref 4.6–6.5)

## 2015-03-06 LAB — COMPREHENSIVE METABOLIC PANEL
ALBUMIN: 4.4 g/dL (ref 3.5–5.2)
ALT: 30 U/L (ref 0–53)
AST: 20 U/L (ref 0–37)
Alkaline Phosphatase: 51 U/L (ref 39–117)
BUN: 19 mg/dL (ref 6–23)
CALCIUM: 9.9 mg/dL (ref 8.4–10.5)
CO2: 29 meq/L (ref 19–32)
CREATININE: 1.07 mg/dL (ref 0.40–1.50)
Chloride: 100 mEq/L (ref 96–112)
GFR: 74.17 mL/min (ref >60.00)
Glucose, Bld: 179 mg/dL — ABNORMAL HIGH (ref 70–99)
Potassium: 4.5 mEq/L (ref 3.5–5.1)
Sodium: 135 mEq/L (ref 135–145)
Total Bilirubin: 1 mg/dL (ref 0.2–1.2)
Total Protein: 7.4 g/dL (ref 6.0–8.3)

## 2015-03-10 ENCOUNTER — Other Ambulatory Visit: Payer: Self-pay | Admitting: Family Medicine

## 2015-03-13 ENCOUNTER — Encounter: Payer: Self-pay | Admitting: Family Medicine

## 2015-03-13 ENCOUNTER — Ambulatory Visit (INDEPENDENT_AMBULATORY_CARE_PROVIDER_SITE_OTHER): Payer: 59 | Admitting: Family Medicine

## 2015-03-13 VITALS — BP 110/78 | HR 64 | Temp 97.6°F | Ht 71.0 in | Wt 232.8 lb

## 2015-03-13 DIAGNOSIS — E1165 Type 2 diabetes mellitus with hyperglycemia: Secondary | ICD-10-CM

## 2015-03-13 DIAGNOSIS — IMO0001 Reserved for inherently not codable concepts without codable children: Secondary | ICD-10-CM

## 2015-03-13 DIAGNOSIS — E785 Hyperlipidemia, unspecified: Secondary | ICD-10-CM

## 2015-03-13 MED ORDER — METFORMIN HCL 1000 MG PO TABS: ORAL_TABLET | ORAL | Status: DC

## 2015-03-13 NOTE — Assessment & Plan Note (Signed)
With low HDL  Statin and diet and fish oil - as optimal as it can be (per pt) Disc goals for lipids and reasons to control them Rev labs with pt Rev low sat fat diet in detail

## 2015-03-13 NOTE — Progress Notes (Signed)
Pre visit review using our clinic review tool, if applicable. No additional management support is needed unless otherwise documented below in the visit note. 

## 2015-03-13 NOTE — Patient Instructions (Signed)
A1C is improved  Keep working on [employer] and exercise  Work on weight loss  Increase exercise  Decrease carbs like bread Try not to eat after dinner   Follow up in 3 months with labs prior

## 2015-03-13 NOTE — Progress Notes (Signed)
Subjective:    Patient ID: Harry Payne, male    DOB: Jul 29, 1951, 64 y.o.   MRN: [id number]  HPI Here for f/u of chronic health problems   Wt is stable  bmi is over 30    Diabetes Home sugar results - average 135 afternoon (higher in the am)  DM diet - so/so -not perfect, but better than last time (he is avoiding bread) Exercise - walking now / 3 days per weeks - 45 minutes (and activity will improve in the summer) Symptoms- none  A1C last  Lab Results  Component Value Date   HGBA1C 7.3* 03/06/2015   This is down from 7.5  No problems with medications - on metformin and glipizide 5 (only one low blood sugar the first day he started it)  Renal protection ace  Last eye exam 6/15  BP Readings from Last 3 Encounters:  03/13/15 110/78  12/12/14 112/62  09/12/14 106/70     Hyperlipidemia Lab Results  Component Value Date   CHOL 148 03/06/2015   CHOL 130 03/11/2014   CHOL 110 01/20/2013   Lab Results  Component Value Date   HDL 28.20* 03/06/2015   HDL 27.20* 03/11/2014   HDL 31.10* 01/20/2013   Lab Results  Component Value Date   LDLCALC 89 03/06/2015   LDLCALC 85 03/11/2014   LDLCALC 58 01/20/2013   Lab Results  Component Value Date   TRIG 155.0* 03/06/2015   TRIG 90.0 03/11/2014   TRIG 103.0 01/20/2013   Lab Results  Component Value Date   CHOLHDL 5 03/06/2015   CHOLHDL 5 03/11/2014   CHOLHDL 4 01/20/2013   No results found for: LDLDIRECT    lipitor 40 and diet  Naturally has a low HDL - even with exercise and fish oil   Patient Active Problem List   Diagnosis Date Noted  . Fatigue 04/14/2014  . Erectile dysfunction 04/14/2014  . Routine general medical examination at a health care facility 03/11/2014  . Family history of prostate cancer 03/11/2014  . Rectal bleeding 01/31/2014  . Obesity 03/25/2012  . B12 DEFICIENCY 11/07/2009  . COLONIC POLYPS, ADENOMATOUS, HX OF 04/04/2009  . Diabetes mellitus type 2, uncontrolled, without complications  38/09/1750  . HYPERLIPIDEMIA 02/09/2008  . ANXIETY 02/09/2008  . MYOCARDIAL INFARCTION, HX OF 02/09/2008  . CORONARY ARTERY DISEASE 02/09/2008  . GERD 02/09/2008  . INSOMNIA 02/09/2008  . TOBACCO USE, QUIT 02/09/2008   Past Medical History  Diagnosis Date  . Anxiety   . CAD (coronary artery disease)   . GERD (gastroesophageal reflux disease)   . Other and unspecified hyperlipidemia   . Depression   . Hypotension   . Hx of adenomatous colonic polyps   . Hemorrhoids   . DM (diabetes mellitus)    Past Surgical History  Procedure Laterality Date  . Closed reduction hand fracture Left 1990    hand  . Coronary angioplasty with stent placement      x 4   History  Substance Use Topics  . Smoking status: Former Smoker    Types: Cigarettes    Quit date: 12/30/2001  . Smokeless tobacco: Never Used     Comment: over 34yrs ago  . Alcohol Use: No   Family History  Problem Relation Age of Onset  . Prostate cancer Father   . Diabetes Brother     x3  . Heart disease Brother    No Known Allergies Current Outpatient Prescriptions on File Prior to Visit  Medication Sig Dispense  Refill  . aspirin 81 MG tablet Take 81 mg by mouth daily.     Marland Kitchen atorvastatin (LIPITOR) 40 MG tablet TAKE 1 TABLET (40 MG TOTAL) BY MOUTH DAILY. 30 tablet 5  . carvedilol (COREG) 25 MG tablet TAKE 1 TABLET (25 MG TOTAL) BY MOUTH 2 (TWO) TIMES DAILY WITH A MEAL. 60 tablet 5  . clopidogrel (PLAVIX) 75 MG tablet TAKE 1 TABLET (75 MG TOTAL) BY MOUTH DAILY. 30 tablet 6  . glipiZIDE (GLUCOTROL XL) 5 MG 24 hr tablet TAKE 1 TABLET (5 MG TOTAL) BY MOUTH DAILY WITH BREAKFAST. 30 tablet 3  . glucose blood (ONE TOUCH ULTRA TEST) test strip CHECK BLOOD SUGAR ONCE DAY AS NEEDED FOR DM E11.9 100 each 0  . metFORMIN (GLUCOPHAGE) 1000 MG tablet TAKE 1 TABLET (1,000 MG TOTAL) BY MOUTH 2 (TWO) TIMES DAILY WITH A MEAL. 60 tablet 1  . NITROSTAT 0.4 MG SL tablet PLACE 1 TABLET (0.4 MG TOTAL) UNDER THE TONGUE EVERY 5 (FIVE) MINUTES  AS NEEDED. 25 tablet 4  . ramipril (ALTACE) 5 MG capsule TAKE 1 CAPSULE (5 MG TOTAL) BY MOUTH DAILY. 90 capsule 3  . ranitidine (ZANTAC) 150 MG tablet TAKE 1 TABLET (150 MG TOTAL) BY MOUTH 2 (TWO) TIMES DAILY. 180 tablet 1  . vitamin B-12 (CYANOCOBALAMIN) 1000 MCG tablet Take 1,000 mcg by mouth daily.      . [DISCONTINUED] pantoprazole (PROTONIX) 40 MG tablet Take 1 tablet (40 mg total) by mouth daily. 30 tablet 11   No current facility-administered medications on file prior to visit.     Review of Systems Review of Systems  Constitutional: Negative for fever, appetite change, fatigue and unexpected weight change.  Eyes: Negative for pain and visual disturbance.  Respiratory: Negative for cough and shortness of breath.   Cardiovascular: Negative for cp or palpitations    Gastrointestinal: Negative for nausea, diarrhea and constipation.  Genitourinary: Negative for urgency and frequency.  Skin: Negative for pallor or rash   Neurological: Negative for weakness, light-headedness, numbness and headaches.  Hematological: Negative for adenopathy. Does not bruise/bleed easily.  Psychiatric/Behavioral: Negative for dysphoric mood. The patient is not nervous/anxious.         Objective:   Physical Exam  Constitutional: He appears well-developed and well-nourished. No distress.  obese and well appearing   HENT:  Head: Normocephalic and atraumatic.  Mouth/Throat: Oropharynx is clear and moist.  Eyes: Conjunctivae and EOM are normal. Pupils are equal, round, and reactive to light. No scleral icterus.  Neck: Normal range of motion. Neck supple. No JVD present. Carotid bruit is not present. No thyromegaly present.  Cardiovascular: Normal rate, regular rhythm, normal heart sounds and intact distal pulses.   Pulmonary/Chest: Effort normal and breath sounds normal. No respiratory distress. He has no wheezes. He has no rales.  Lymphadenopathy:    He has no cervical adenopathy.  Neurological: He is  alert.  Skin: Skin is warm and dry. No rash noted. No erythema.  Psychiatric: He has a normal mood and affect.          Assessment & Plan:   Problem List Items Addressed This Visit      Other   Diabetes mellitus type 2, uncontrolled, without complications - Primary    Lab Results  Component Value Date   HGBA1C 7.3* 03/06/2015   This is improved  Pt is apprehensive to inc glipizide for risk of low glucose  Will continue current tx and also exercise 5d per week Plans to cut out  bread and reduce carbs and loose wt  F/u 3 mo with lab prior opth exam due in June       Relevant Medications   metFORMIN (GLUCOPHAGE) tablet   Hyperlipidemia    With low HDL  Statin and diet and fish oil - as optimal as it can be (per pt) Disc goals for lipids and reasons to control them Rev labs with pt Rev low sat fat diet in detail

## 2015-03-13 NOTE — Assessment & Plan Note (Signed)
Lab Results  Component Value Date   HGBA1C 7.3* 03/06/2015   This is improved  Pt is apprehensive to inc glipizide for risk of low glucose  Will continue current tx and also exercise 5d per week Plans to cut out bread and reduce carbs and loose wt  F/u 3 mo with lab prior opth exam due in June

## 2015-04-11 ENCOUNTER — Ambulatory Visit (HOSPITAL_COMMUNITY)
Admission: RE | Admit: 2015-04-11 | Discharge: 2015-04-11 | Disposition: A | Discharge status: Home / Self Care | Payer: 59 | Source: Ambulatory Visit | Attending: Internal Medicine | Admitting: Internal Medicine

## 2015-04-11 ENCOUNTER — Encounter (HOSPITAL_COMMUNITY): Payer: Self-pay

## 2015-04-11 VITALS — BP 116/72 | HR 61 | Wt 232.5 lb

## 2015-04-11 DIAGNOSIS — Z79899 Other long term (current) drug therapy: Secondary | ICD-10-CM | POA: Diagnosis not present

## 2015-04-11 DIAGNOSIS — I252 Old myocardial infarction: Secondary | ICD-10-CM | POA: Diagnosis not present

## 2015-04-11 DIAGNOSIS — I2510 Atherosclerotic heart disease of native coronary artery without angina pectoris: Secondary | ICD-10-CM | POA: Diagnosis not present

## 2015-04-11 DIAGNOSIS — K219 Gastro-esophageal reflux disease without esophagitis: Secondary | ICD-10-CM | POA: Insufficient documentation

## 2015-04-11 DIAGNOSIS — Z7902 Long term (current) use of antithrombotics/antiplatelets: Secondary | ICD-10-CM | POA: Insufficient documentation

## 2015-04-11 DIAGNOSIS — E119 Type 2 diabetes mellitus without complications: Secondary | ICD-10-CM | POA: Insufficient documentation

## 2015-04-11 DIAGNOSIS — Z7982 Long term (current) use of aspirin: Secondary | ICD-10-CM | POA: Insufficient documentation

## 2015-04-11 DIAGNOSIS — Z48812 Encounter for surgical aftercare following surgery on the circulatory system: Secondary | ICD-10-CM | POA: Diagnosis present

## 2015-04-11 DIAGNOSIS — I255 Ischemic cardiomyopathy: Secondary | ICD-10-CM | POA: Diagnosis not present

## 2015-04-11 DIAGNOSIS — E785 Hyperlipidemia, unspecified: Secondary | ICD-10-CM | POA: Insufficient documentation

## 2015-04-11 DIAGNOSIS — N529 Male erectile dysfunction, unspecified: Secondary | ICD-10-CM | POA: Diagnosis not present

## 2015-04-11 DIAGNOSIS — R079 Chest pain, unspecified: Secondary | ICD-10-CM | POA: Insufficient documentation

## 2015-04-11 MED ORDER — ATORVASTATIN CALCIUM 80 MG PO TABS: 80.0000 mg | ORAL_TABLET | Freq: Every day | ORAL | Status: DC

## 2015-04-11 NOTE — Progress Notes (Signed)
Patient ID: STACY DESHLER, male   DOB: 1951-01-16, 64 y.o.   MRN: [id number]  PCP: Dr Alba Cory  HPI: Alastair Hennes is a 64 yo male with CAD s/p anterior MI 2004 (s/p stenting LAD and LCX), DM2, and hyperlipidemia (previously in AIM-HIGH trial). EF 48% by MR. Myoview 2009 EF 43%. anterior apical scar. no ischemia.   He presented to [facility] 02/16/11 with chest pain.  He ruled out for myocardial infarction.  Cardiac catheterization done 02/18/2011: Proximal LAD stent patent; midcircumflex 25%, circumflex stent patent; RCA 25% of the PDA, proximal PDA 25%; posterior lateral 25-30%; EF 50% with anterior dyskinesis.  Given the non-obstructive nature of his anatomy, it was felt that his chest pain was noncardiac.   He returns for yearly follow up. Overall doing fairly well. Still struggling with ED. Tried sildenafil 50mg  without effect. 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 Says he gets CP about 1x/week. Can happen at any time. Not reproducible with exertion. Lasts a few minutes and resolves.   Labs 3/16: TC 148 TG 155 HDL 28 LDL 89   ROS: All systems negative except as listed in HPI, PMH and Problem List.  Past Medical History  Diagnosis Date  . Anxiety   . CAD (coronary artery disease)   . GERD (gastroesophageal reflux disease)   . Other and unspecified hyperlipidemia   . Depression   . Hypotension   . Hx of adenomatous colonic polyps   . Hemorrhoids   . DM (diabetes mellitus)     Current Outpatient Prescriptions  Medication Sig Dispense Refill  . aspirin 81 MG tablet Take 81 mg by mouth daily.     Marland Kitchen atorvastatin (LIPITOR) 40 MG tablet TAKE 1 TABLET (40 MG TOTAL) BY MOUTH DAILY. 30 tablet 5  . carvedilol (COREG) 25 MG tablet TAKE 1 TABLET (25 MG TOTAL) BY MOUTH 2 (TWO) TIMES DAILY WITH A MEAL. 60 tablet 5  . clopidogrel (PLAVIX) 75 MG tablet TAKE 1 TABLET (75 MG TOTAL) BY MOUTH DAILY. 30 tablet 6  . glipiZIDE (GLUCOTROL XL) 5 MG 24 hr tablet TAKE  1 TABLET (5 MG TOTAL) BY MOUTH DAILY WITH BREAKFAST. 30 tablet 3  . glucose blood (ONE TOUCH ULTRA TEST) test strip CHECK BLOOD SUGAR ONCE DAY AS NEEDED FOR DM E11.9 100 each 0  . metFORMIN (GLUCOPHAGE) 1000 MG tablet TAKE 1 TABLET (1,000 MG TOTAL) BY MOUTH 2 (TWO) TIMES DAILY WITH A MEAL. 60 tablet 11  . NITROSTAT 0.4 MG SL tablet PLACE 1 TABLET (0.4 MG TOTAL) UNDER THE TONGUE EVERY 5 (FIVE) MINUTES AS NEEDED. 25 tablet 4  . Omega-3 Fatty Acids (FISH OIL) 1000 MG CAPS Take by mouth.    . ramipril (ALTACE) 5 MG capsule TAKE 1 CAPSULE (5 MG TOTAL) BY MOUTH DAILY. 90 capsule 3  . ranitidine (ZANTAC) 150 MG tablet TAKE 1 TABLET (150 MG TOTAL) BY MOUTH 2 (TWO) TIMES DAILY. 180 tablet 1  . vitamin B-12 (CYANOCOBALAMIN) 1000 MCG tablet Take 1,000 mcg by mouth daily.      . [DISCONTINUED] pantoprazole (PROTONIX) 40 MG tablet Take 1 tablet (40 mg total) by mouth daily. 30 tablet 11   No current facility-administered medications for this encounter.     PHYSICAL EXAM: Filed Vitals:   04/11/15 1104  BP: 116/72  Pulse: 61   General:  Well appearing. No resp difficulty HEENT: normal Neck: supple. JVP flat. Carotids 2+ bilaterally; no bruits. No lymphadenopathy or thryomegaly appreciated. Cor: PMI  normal. Regular rate & rhythm. No rubs, gallops or murmurs. Lungs: clear Abdomen: central obesity. soft, nontender, nondistended. No hepatosplenomegaly. No bruits or masses. Good bowel sounds. Extremities: no cyanosis, clubbing, rash, edema Neuro: alert & orientedx3, cranial nerves grossly intact. Moves all 4 extremities w/o difficulty. Affect pleasant.   ECG: NSR 60.  Anteroseptal qs (old) No ST-T wave abnormalities.    ASSESSMENT & PLAN: 1. CAD s/p previous anterior MI 2. 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 Hyperlipidemia 4. Fatigue 5. Erectile dysfunction 6. DM2  7. Chest pain  Overall fairly stable. Given CP will proceed with exercise myoview to exclude recurrent blockages. Lipids with metabolic syndrome  profile and elevated LDL. Will increase atrovastatin to 80 daily. Stressed need for diet and weight loss - try [employer]. 20-30 minutes of walking every day. Can increased sildenafil to 100mg  PRN for ED. Made it clear never take NTG when sildenafil on board.   Deyton Ellenbecker,MD 11:23 AM

## 2015-04-11 NOTE — Patient Instructions (Signed)
YOUR Provider ordered an Exercise Myoview.  START Lipitor 80 mg daily.  WALK 20-30 minutes a day.  FOLLOW UP in 12 months.

## 2015-04-11 NOTE — Addendum Note (Signed)
Encounter addended by: Harvie Junior, CMA on: 04/11/2015 11:40 AM<BR>     Documentation filed: Dx Association, Patient Instructions Section, Orders

## 2015-04-12 NOTE — Addendum Note (Signed)
Encounter addended by: Yehuda Mao on: 04/12/2015 10:19 AM<BR>     Documentation filed: Charges VN

## 2015-04-16 ENCOUNTER — Other Ambulatory Visit: Payer: Self-pay | Admitting: Family Medicine

## 2015-04-17 ENCOUNTER — Encounter: Payer: Self-pay | Admitting: Cardiology

## 2015-04-17 ENCOUNTER — Encounter: Payer: Self-pay | Admitting: Internal Medicine

## 2015-04-19 ENCOUNTER — Ambulatory Visit (HOSPITAL_COMMUNITY): Payer: 59 | Attending: Internal Medicine | Admitting: Radiology

## 2015-04-19 DIAGNOSIS — E119 Type 2 diabetes mellitus without complications: Secondary | ICD-10-CM | POA: Insufficient documentation

## 2015-04-19 DIAGNOSIS — I10 Essential (primary) hypertension: Secondary | ICD-10-CM | POA: Insufficient documentation

## 2015-04-19 DIAGNOSIS — Z87891 Personal history of nicotine dependence: Secondary | ICD-10-CM | POA: Insufficient documentation

## 2015-04-19 DIAGNOSIS — I2510 Atherosclerotic heart disease of native coronary artery without angina pectoris: Secondary | ICD-10-CM

## 2015-04-19 DIAGNOSIS — R5383 Other fatigue: Secondary | ICD-10-CM | POA: Diagnosis not present

## 2015-04-19 MED ORDER — TECHNETIUM TC 99M SESTAMIBI GENERIC - CARDIOLITE
11.0000 | Freq: Once | INTRAVENOUS | Status: AC | PRN
  Administered 2015-04-19: 11 via INTRAVENOUS

## 2015-04-19 MED ORDER — TECHNETIUM TC 99M SESTAMIBI GENERIC - CARDIOLITE
33.0000 | Freq: Once | INTRAVENOUS | Status: AC | PRN
  Administered 2015-04-19: 33 via INTRAVENOUS

## 2015-04-19 NOTE — Progress Notes (Signed)
[facility] NUCLEAR MED [address] [place], [place] [address] [phone]    Cardiology Nuclear Med Study  Harry Payne is a 64 y.o. male     MRN : [id number]     DOB: [birth year]  Procedure Date: 04/19/2015  Nuclear Med Background Indication for Stress Test:  Evaluation for Ischemia History:  CAD, MPI 2009 (normal, scar) EF 48% Cardiac Risk Factors: Hypertension and NIDDM  Symptoms:  Fatigue   Nuclear Pre-Procedure Caffeine/Decaff Intake:  6:00am NPO After: 7:00pm   Lungs:  clear O2 Sat: 93% on room air. IV 0.9% NS with Angio Cath:  22g  IV Site: R Hand  IV Started by:  Crissie Figures, RN  Chest Size (in):  48 Cup Size: n/a  Height: 5\' 11"  (1.803 m)  Weight:  220 lb (99.791 kg)  BMI:  Body mass index is 30.7 kg/(m^2). Tech Comments:  Patient had Caffeine this AM    Nuclear Med Study 1 or 2 day study: 1 day  Stress Test Type:  Stress  Reading MD: N/A  Order Authorizing Provider:  Glori Bickers, MD  Resting Radionuclide: Technetium 10m Sestamibi  Resting Radionuclide Dose: 11.0 mCi   Stress Radionuclide:  Technetium 73m Sestamibi  Stress Radionuclide Dose: 33.0 mCi           Stress Protocol Rest HR: 53 Stress HR: 144  Rest BP: 132/69 Stress BP: 159/80  Exercise Time (min): 7:30 METS: 9.3   Predicted Max HR: 157 bpm % Max HR: 91.72 bpm Rate Pressure Product: 24336   Dose of Adenosine (mg):  n/a Dose of Lexiscan: n/a mg  Dose of Atropine (mg): n/a Dose of Dobutamine: n/a mcg/kg/min (at max HR)  Stress Test Technologist: Glade Lloyd, BS-ES  Nuclear Technologist:  Annye Rusk, CNMT     Rest Procedure:  Myocardial perfusion imaging was performed at rest 45 minutes following the intravenous administration of Technetium 70m Sestamibi. Rest ECG: NSR - Normal EKG  Stress Procedure:  The patient exercised on the treadmill utilizing the Bruce Protocol for 7:30 minutes. The patient stopped due to fatigue and denied any chest pain.  Technetium 69m  Sestamibi was injected at peak exercise and myocardial perfusion imaging was performed after a brief delay. Stress ECG: There is mild, insignificant ST changes.   QPS Raw Data Images:  Normal; no motion artifact; normal heart/lung ratio. Stress Images:   There is large severe defect in the distal anterior and apical walls.   Rest Images:  There is large severe defect in the distal anterior and apical walls.  Subtraction (SDS):  No evidence of ischemia.  There is evidence of a large anterior apical scar.  Transient Ischemic Dilatation (Normal <1.22):  0.94 Lung/Heart Ratio (Normal <0.45):  0.40  Quantitative Gated Spect Images QGS EDV:  167 ml QGS ESV:  114 ml  Impression Exercise Capacity:  Good exercise capacity. BP Response:  Normal blood pressure response. Clinical Symptoms:  No significant symptoms noted. ECG Impression:  No significant ST segment change suggestive of ischemia. Comparison with Prior Nuclear Study: No images to compare  Overall Impression:  Intermediate risk stress nuclear study .  The perfusion images have not changed significantly.  the LV function has decreased since previous study in 2008. Marland Kitchen  LV Ejection Fraction: 32%.  LV Wall Motion:  akinesis of the distal anterior wall and apex.     Thayer Headings, [facility]., MD, [facility] 04/19/2015, 2:50 PM [address],  [address] Office -  [phone] Pager [phone]

## 2015-05-01 ENCOUNTER — Telehealth (HOSPITAL_COMMUNITY): Payer: Self-pay | Admitting: Vascular Surgery

## 2015-05-01 ENCOUNTER — Other Ambulatory Visit (HOSPITAL_COMMUNITY): Payer: Self-pay

## 2015-05-01 DIAGNOSIS — I5020 Unspecified systolic (congestive) heart failure: Secondary | ICD-10-CM

## 2015-05-05 ENCOUNTER — Other Ambulatory Visit: Payer: Self-pay | Admitting: Family Medicine

## 2015-05-08 ENCOUNTER — Encounter (HOSPITAL_COMMUNITY): Payer: Self-pay

## 2015-05-08 ENCOUNTER — Ambulatory Visit (HOSPITAL_COMMUNITY)
Admission: RE | Admit: 2015-05-08 | Discharge: 2015-05-08 | Disposition: A | Discharge status: Home / Self Care | Payer: 59 | Source: Ambulatory Visit | Attending: Internal Medicine | Admitting: Internal Medicine

## 2015-05-08 DIAGNOSIS — I5020 Unspecified systolic (congestive) heart failure: Secondary | ICD-10-CM | POA: Insufficient documentation

## 2015-05-08 MED ORDER — PERFLUTREN LIPID MICROSPHERE
1.0000 mL | INTRAVENOUS | Status: AC | PRN
  Administered 2015-05-08: 2 mL via INTRAVENOUS

## 2015-05-08 MED ORDER — SODIUM CHLORIDE 0.9 % IV SOLN
INTRAVENOUS | Status: DC
  Administered 2015-05-08: 500 mL via INTRAVENOUS

## 2015-05-08 MED ORDER — PERFLUTREN LIPID MICROSPHERE
INTRAVENOUS | Status: AC
  Filled 2015-05-08: qty 10

## 2015-05-08 NOTE — Progress Notes (Signed)
  Echocardiogram 2D Echocardiogram has been performed.  Harry Payne M 05/08/2015, 10:07 AM

## 2015-05-30 ENCOUNTER — Telehealth (HOSPITAL_COMMUNITY): Payer: Self-pay | Admitting: *Deleted

## 2015-05-30 DIAGNOSIS — I255 Ischemic cardiomyopathy: Secondary | ICD-10-CM

## 2015-05-30 NOTE — Telephone Encounter (Signed)
Received mess from Dr Haroldine Laws that he had called pt Fri 5/27 PM with myoview and echo results and he would like pt to see Dr Caryl Comes for ICD as EF low, spoke w/pt he is aware, will sch appt and call him back

## 2015-05-31 NOTE — Telephone Encounter (Signed)
Pt is sch to see Dr Caryl Comes Tue 6/7 at 3:30, pt aware

## 2015-06-03 ENCOUNTER — Other Ambulatory Visit: Payer: Self-pay | Admitting: Family Medicine

## 2015-06-06 ENCOUNTER — Encounter: Payer: Self-pay | Admitting: *Deleted

## 2015-06-06 ENCOUNTER — Ambulatory Visit (INDEPENDENT_AMBULATORY_CARE_PROVIDER_SITE_OTHER): Payer: 59 | Admitting: Internal Medicine

## 2015-06-06 ENCOUNTER — Encounter: Payer: Self-pay | Admitting: Internal Medicine

## 2015-06-06 VITALS — BP 109/60 | HR 70 | Ht 71.0 in | Wt 229.6 lb

## 2015-06-06 DIAGNOSIS — I255 Ischemic cardiomyopathy: Secondary | ICD-10-CM | POA: Diagnosis not present

## 2015-06-06 DIAGNOSIS — R9430 Abnormal result of cardiovascular function study, unspecified: Secondary | ICD-10-CM

## 2015-06-06 NOTE — Progress Notes (Signed)
ELECTROPHYSIOLOGY CONSULT NOTE  Patient ID: Harry Payne, MRN: [id number], DOB/AGE: 63-Jun-1952 63 y.o. Admit date: (Not on file) Date of Consult: 06/06/2015  Primary Physician: Loura Pardon, MD Primary Cardiologist: CHF  Chief Complaint: ICD   HPI Harry Payne is a 64 y.o. male   with CAD s/p anterior MI 2004 (s/p stenting LAD and LCX), DM2, and hyperlipidemia (previously in AIM-HIGH trial). EF 48% by MR. Myoview 2009 EF 43%. anterior apical scar. no ischemia.   He presented to [facility]/16[facility] with chest pain. He ruled out for myocardial infarction. Cardiac catheterization done 02/18/2011: Proximal LAD stent patent; midcircumflex 25%, circumflex stent patent; RCA 25% of the PDA, proximal PDA 25%; posterior lateral 25-30%; EF 50% with anterior dyskinesis.   Echo repeated 5/16 EF 30% or so  Mild Sx of DOE  Esp with bending No palps, syncope, edema PD or orthopnea  Past Medical History  Diagnosis Date  . Anxiety   . CAD (coronary artery disease)   . GERD (gastroesophageal reflux disease)   . Other and unspecified hyperlipidemia   . Depression   . Hypotension   . Hx of adenomatous colonic polyps   . Hemorrhoids   . DM (diabetes mellitus)       Surgical History:  Past Surgical History  Procedure Laterality Date  . Closed reduction hand fracture Left 1990    hand  . Coronary angioplasty with stent placement      x 4     Home Meds: Prior to Admission medications   Medication Sig Start Date End Date Taking? Authorizing Provider  aspirin 81 MG tablet Take 81 mg by mouth daily.    Yes Historical Provider, MD  atorvastatin (LIPITOR) 80 MG tablet Take 1 tablet (80 mg total) by mouth daily. 04/11/15  Yes Shaune Pascal Bensimhon, MD  carvedilol (COREG) 25 MG tablet TAKE 1 TABLET (25 MG TOTAL) BY MOUTH 2 (TWO) TIMES DAILY WITH A MEAL. 01/09/15  Yes Abner Greenspan, MD  clopidogrel (PLAVIX) 75 MG tablet TAKE 1 TABLET (75 MG TOTAL) BY MOUTH DAILY. 12/19/14  Yes Shaune Pascal  Bensimhon, MD  glipiZIDE (GLUCOTROL XL) 5 MG 24 hr tablet TAKE 1 TABLET (5 MG TOTAL) BY MOUTH DAILY WITH BREAKFAST. 05/05/15  Yes Marne A Tower, MD  glucose blood (ONE TOUCH ULTRA TEST) test strip CHECK BLOOD SUGAR ONCE DAY AS NEEDED FOR DM E11.9 11/07/14  Yes Abner Greenspan, MD  metFORMIN (GLUCOPHAGE) 1000 MG tablet TAKE 1 TABLET (1,000 MG TOTAL) BY MOUTH 2 (TWO) TIMES DAILY WITH A MEAL. 03/13/15  Yes Marne A Tower, MD  NITROSTAT 0.4 MG SL tablet PLACE 1 TABLET (0.4 MG TOTAL) UNDER THE TONGUE EVERY 5 (FIVE) MINUTES AS NEEDED. 11/01/14  Yes Abner Greenspan, MD  Omega-3 Fatty Acids (FISH OIL) 1000 MG CAPS Take 1 capsule by mouth daily.    Yes Historical Provider, MD  ramipril (ALTACE) 5 MG capsule TAKE 1 CAPSULE (5 MG TOTAL) BY MOUTH DAILY. 03/10/15  Yes Abner Greenspan, MD  ranitidine (ZANTAC) 75 MG tablet Take 75 mg by mouth 2 (two) times daily.   Yes Historical Provider, MD  vitamin B-12 (CYANOCOBALAMIN) 1000 MCG tablet Take 1,000 mcg by mouth daily.     Yes Historical Provider, MD     Allergies: No Known Allergies  History   Social History  . Marital Status: Married    Spouse Name: N/A  . Number of Children: 3  . Years of Education: N/A  Occupational History  . 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 Smoking status: Former Smoker    Types: Cigarettes    Quit date: 12/30/2001  . Smokeless tobacco: Never Used     Comment: over 32yrs ago  . Alcohol Use: No  . Drug Use: No  . Sexual Activity: Not on file   Other Topics Concern  . Not on file   Social History Narrative     Family History  Problem Relation Age of Onset  . Prostate cancer Father   . Diabetes Brother     x3  . Heart disease Brother      ROS:  Please see the history of present illness.     All other systems reviewed and negative.    Physical Exam   Blood pressure 109/60, pulse 70, height 5\' 11"  (1.803 m), weight 229 lb 9.6 oz (104.146 kg). General: Well developed, well nourished male in no  acute distress. Head: Normocephalic, atraumatic, sclera non-icteric, no xanthomas, nares are without discharge. EENT: normal Lymph Nodes:  none Back: without scoliosis/kyphosis , no CVA tendersness Neck: Negative for carotid bruits. JVD not elevated. Lungs: Clear bilaterally to auscultation without wheezes, rales, or rhonchi. Breathing is unlabored. Heart: RRR with S1 S2. No*  murmur , rubs, or gallops appreciated. Abdomen: Soft, non-tender, non-distended with normoactive bowel sounds. No hepatomegaly. No rebound/guarding. No obvious abdominal masses. Msk:  Strength and tone appear normal for age. Extremities: No clubbing or cyanosis. No edema.  Distal pedal pulses are 2+ and equal bilaterally. Skin: Warm and Dry Neuro: Alert and oriented X 3. CN III-XII intact Grossly normal sensory and motor function . Psych:  Responds to questions appropriately with a normal affect.      Labs: Cardiac Enzymes No results for input(s): CKTOTAL, CKMB, TROPONINI in the last 72 hours. CBC Lab Results  Component Value Date   WBC 8.0 03/11/2014   HGB 15.1 03/11/2014   HCT 45.1 03/11/2014   MCV 96.4 03/11/2014   PLT 255.0 03/11/2014   PROTIME: No results for input(s): LABPROT, INR in the last 72 hours. Chemistry No results for input(s): NA, K, CL, CO2, BUN, CREATININE, CALCIUM, PROT, BILITOT, ALKPHOS, ALT, AST, GLUCOSE in the last 168 hours.  Invalid input(s): LABALBU Lipids Lab Results  Component Value Date   CHOL 148 03/06/2015   HDL 28.20* 03/06/2015   LDLCALC 89 03/06/2015   TRIG 155.0* 03/06/2015   BNP No results found for: PROBNP Thyroid Function Tests: No results for input(s): TSH, T4TOTAL, T3FREE, THYROIDAB in the last 72 hours.  Invalid input(s): FREET3    Miscellaneous No results found for: DDIMER  Radiology/Studies:  No results found.  EKG: NSR 70 15/08.37   Assessment and Plan:  Ischemic cardiomyopathy  Obstructive sleep apnea  Pt has persistent LV dysfunction  wth ischemic cardiomyopathy and mild symptoms of cHF  He is candidate for ICD for primary prevention  Have reviewed the potential benefits and risks of ICD implantation including but not limited to death, perforation of heart or lung, lead dislodgement, infection,  device malfunction and inappropriate shocks.  The patient and family express understanding  and are willing to proceed.    He also needs slepp study for OSA   Virl Axe

## 2015-06-06 NOTE — Patient Instructions (Signed)
Medication Instructions:  Your physician recommends that you continue on your current medications as directed. Please refer to the Current Medication list given to you today.  Labwork: Pre-procedure labs today  Testing/Procedures: Your physician has recommended that you have a defibrillator inserted. An implantable cardioverter defibrillator (ICD) is a small device that is placed in your chest or, in rare cases, your abdomen. This device uses electrical pulses or shocks to help control life-threatening, irregular heartbeats that could lead the heart to suddenly stop beating (sudden cardiac arrest). Leads are attached to the ICD that goes into your heart. This is done in the hospital and usually requires an overnight stay. Please see the instruction sheet given to you today for more information.  Follow-Up: Your wound check is scheduled for 06/26/15 at 3:00 p.m. at [address].   Thank you for choosing [facility]!!

## 2015-06-07 LAB — CBC WITH DIFFERENTIAL/PLATELET
BASOS PCT: 0.6 % (ref 0.0–3.0)
Basophils Absolute: 0.1 10*3/uL (ref 0.0–0.1)
Eosinophils Absolute: 0.4 10*3/uL (ref 0.0–0.7)
Eosinophils Relative: 4.2 % (ref 0.0–5.0)
HCT: 44.7 % (ref 39.0–52.0)
HEMOGLOBIN: 15 g/dL (ref 13.0–17.0)
Lymphocytes Relative: 29 % (ref 12.0–46.0)
Lymphs Abs: 2.8 10*3/uL (ref 0.7–4.0)
MCHC: 33.5 g/dL (ref 30.0–36.0)
MCV: 95.4 fl (ref 78.0–100.0)
MONO ABS: 0.9 10*3/uL (ref 0.1–1.0)
MONOS PCT: 9 % (ref 3.0–12.0)
NEUTROS PCT: 57.2 % (ref 43.0–77.0)
Neutro Abs: 5.6 10*3/uL (ref 1.4–7.7)
Platelets: 283 10*3/uL (ref 150.0–400.0)
RBC: 4.69 Mil/uL (ref 4.22–5.81)
RDW: 13.6 % (ref 11.5–15.5)
WBC: 9.8 10*3/uL (ref 4.0–10.5)

## 2015-06-07 LAB — BASIC METABOLIC PANEL
BUN: 17 mg/dL (ref 6–23)
CHLORIDE: 104 meq/L (ref 96–112)
CO2: 30 meq/L (ref 19–32)
Calcium: 9.9 mg/dL (ref 8.4–10.5)
Creatinine, Ser: 0.96 mg/dL (ref 0.40–1.50)
GFR: 83.99 mL/min (ref >60.00)
GLUCOSE: 71 mg/dL (ref 70–99)
Potassium: 4.2 mEq/L (ref 3.5–5.1)
Sodium: 140 mEq/L (ref 135–145)

## 2015-06-08 NOTE — Telephone Encounter (Signed)
Left message for appt.

## 2015-06-09 ENCOUNTER — Other Ambulatory Visit (INDEPENDENT_AMBULATORY_CARE_PROVIDER_SITE_OTHER): Payer: 59

## 2015-06-09 DIAGNOSIS — E1165 Type 2 diabetes mellitus with hyperglycemia: Secondary | ICD-10-CM | POA: Diagnosis not present

## 2015-06-09 DIAGNOSIS — IMO0001 Reserved for inherently not codable concepts without codable children: Secondary | ICD-10-CM

## 2015-06-09 LAB — BASIC METABOLIC PANEL
BUN: 18 mg/dL (ref 6–23)
CALCIUM: 9.7 mg/dL (ref 8.4–10.5)
CO2: 28 meq/L (ref 19–32)
CREATININE: 0.98 mg/dL (ref 0.40–1.50)
Chloride: 103 mEq/L (ref 96–112)
GFR: 82.01 mL/min (ref >60.00)
GLUCOSE: 146 mg/dL — AB (ref 70–99)
POTASSIUM: 4.4 meq/L (ref 3.5–5.1)
SODIUM: 137 meq/L (ref 135–145)

## 2015-06-09 LAB — HEMOGLOBIN A1C: Hgb A1c MFr Bld: 6.5 % (ref 4.6–6.5)

## 2015-06-13 ENCOUNTER — Ambulatory Visit (INDEPENDENT_AMBULATORY_CARE_PROVIDER_SITE_OTHER): Payer: 59 | Admitting: Family Medicine

## 2015-06-13 ENCOUNTER — Encounter: Payer: Self-pay | Admitting: Family Medicine

## 2015-06-13 VITALS — BP 118/68 | HR 66 | Temp 97.3°F | Ht 71.0 in | Wt 225.8 lb

## 2015-06-13 DIAGNOSIS — E119 Type 2 diabetes mellitus without complications: Secondary | ICD-10-CM | POA: Diagnosis not present

## 2015-06-13 DIAGNOSIS — E669 Obesity, unspecified: Secondary | ICD-10-CM

## 2015-06-13 NOTE — Patient Instructions (Signed)
A1C is better  Follow up in 6 months with labs prior  Eat healthy/ keep exercising and loosing weight - it is making a difference And make your annual eye exam appt also

## 2015-06-13 NOTE — Progress Notes (Signed)
Subjective:    Patient ID: Harry Payne, male    DOB: 1951/01/25, 64 y.o.   MRN: [id number]  HPI Here for f/u of chronic health problems  Having a defib put in tomorrow - is nervous about that , seeing Dr Caryl Comes Is short winded at times     Wt is down 4 lb with bmi of 31  Diabetes- improved today  Home sugar results -one low blood sugar last night - in 70s (had skipped a meal) DM diet - is eating better now / not eating the sweets he was and also drinks a lot more water  Exercise has not exercised due to heart issue (2 weeks)  Symptoms- none  A1C last  Lab Results  Component Value Date   HGBA1C 6.5 06/09/2015   Was 7.3  No problems with medications  Renal protection- on ace  Last eye exam - will be due this month-no vision changes   Wt is down 4 lb  bmi of 31   No change in medicines  BP Readings from Last 3 Encounters:  06/13/15 118/68  06/06/15 109/60  05/08/15 127/70    Patient Active Problem List   Diagnosis Date Noted  . Fatigue 04/14/2014  . Erectile dysfunction 04/14/2014  . Routine general medical examination at a health care facility 03/11/2014  . Family history of prostate cancer 03/11/2014  . Rectal bleeding 01/31/2014  . Obesity 03/25/2012  . B12 DEFICIENCY 11/07/2009  . COLONIC POLYPS, ADENOMATOUS, HX OF 04/04/2009  . Diabetes mellitus type 2, uncontrolled, without complications 40/98/1191  . Hyperlipidemia 02/09/2008  . ANXIETY 02/09/2008  . MYOCARDIAL INFARCTION, HX OF 02/09/2008  . CAD (coronary artery disease), native coronary artery 02/09/2008  . GERD 02/09/2008  . INSOMNIA 02/09/2008  . TOBACCO USE, QUIT 02/09/2008   Past Medical History  Diagnosis Date  . Anxiety   . CAD (coronary artery disease)   . GERD (gastroesophageal reflux disease)   . Other and unspecified hyperlipidemia   . Depression   . Hypotension   . Hx of adenomatous colonic polyps   . Hemorrhoids   . DM (diabetes mellitus)    Past Surgical History  Procedure  Laterality Date  . Closed reduction hand fracture Left 1990    hand  . Coronary angioplasty with stent placement      x 4   History  Substance Use Topics  . Smoking status: Former Smoker    Types: Cigarettes    Quit date: 12/30/2001  . Smokeless tobacco: Never Used     Comment: over 60yrs ago  . Alcohol Use: No   Family History  Problem Relation Age of Onset  . Prostate cancer Father   . Diabetes Brother     x3  . Heart disease Brother    No Known Allergies Current Outpatient Prescriptions on File Prior to Visit  Medication Sig Dispense Refill  . acetaminophen (TYLENOL) 500 MG tablet Take 500 mg by mouth every 6 (six) hours as needed for mild pain or moderate pain.    Marland Kitchen aspirin 81 MG tablet Take 81 mg by mouth daily.     Marland Kitchen atorvastatin (LIPITOR) 80 MG tablet Take 1 tablet (80 mg total) by mouth daily. (Patient taking differently: Take 80 mg by mouth daily at 6 PM. ) 30 tablet 3  . carvedilol (COREG) 25 MG tablet TAKE 1 TABLET (25 MG TOTAL) BY MOUTH 2 (TWO) TIMES DAILY WITH A MEAL. 60 tablet 5  . clopidogrel (PLAVIX) 75 MG tablet TAKE  1 TABLET (75 MG TOTAL) BY MOUTH DAILY. 30 tablet 6  . glipiZIDE (GLUCOTROL XL) 5 MG 24 hr tablet TAKE 1 TABLET (5 MG TOTAL) BY MOUTH DAILY WITH BREAKFAST. 30 tablet 5  . glucose blood (ONE TOUCH ULTRA TEST) test strip CHECK BLOOD SUGAR ONCE DAY AS NEEDED FOR DM E11.9 100 each 0  . metFORMIN (GLUCOPHAGE) 1000 MG tablet TAKE 1 TABLET (1,000 MG TOTAL) BY MOUTH 2 (TWO) TIMES DAILY WITH A MEAL. 60 tablet 11  . NITROSTAT 0.4 MG SL tablet PLACE 1 TABLET (0.4 MG TOTAL) UNDER THE TONGUE EVERY 5 (FIVE) MINUTES AS NEEDED. (Patient taking differently: PLACE 1 TABLET (0.4 MG TOTAL) UNDER THE TONGUE EVERY 5 (FIVE) MINUTES AS NEEDED FOR CHEST PAIN) 25 tablet 4  . Omega-3 Fatty Acids (FISH OIL) 1000 MG CAPS Take 1 capsule by mouth daily.     . ramipril (ALTACE) 5 MG capsule TAKE 1 CAPSULE (5 MG TOTAL) BY MOUTH DAILY. 90 capsule 3  . ranitidine (ZANTAC) 75 MG tablet  Take 75 mg by mouth 2 (two) times daily.    . vitamin B-12 (CYANOCOBALAMIN) 1000 MCG tablet Take 1,000 mcg by mouth daily.      . [DISCONTINUED] pantoprazole (PROTONIX) 40 MG tablet Take 1 tablet (40 mg total) by mouth daily. 30 tablet 11   No current facility-administered medications on file prior to visit.        Review of Systems Review of Systems  Constitutional: Negative for fever, appetite change, fatigue and unexpected weight change.  Eyes: Negative for pain and visual disturbance.  Respiratory: Negative for cough and shortness of breath.   Cardiovascular: Negative for cp or palpitations    Gastrointestinal: Negative for nausea, diarrhea and constipation.  Genitourinary: Negative for urgency and frequency.  Skin: Negative for pallor or rash   Neurological: Negative for weakness, light-headedness, numbness and headaches.  Hematological: Negative for adenopathy. Does not bruise/bleed easily.  Psychiatric/Behavioral: Negative for dysphoric mood. The patient is nervous/anxious.         Objective:   Physical Exam  Constitutional: He appears well-developed and well-nourished. No distress.  obese and well appearing   HENT:  Head: Normocephalic and atraumatic.  Mouth/Throat: Oropharynx is clear and moist.  Eyes: Conjunctivae and EOM are normal. Pupils are equal, round, and reactive to light.  Neck: Normal range of motion. Neck supple. No JVD present. Carotid bruit is not present. No thyromegaly present.  Cardiovascular: Normal rate, regular rhythm, normal heart sounds and intact distal pulses.  Exam reveals no gallop.   Pulmonary/Chest: Effort normal and breath sounds normal. No respiratory distress. He has no wheezes. He has no rales.  No crackles  Abdominal: Soft. Bowel sounds are normal. He exhibits no distension, no abdominal bruit and no mass. There is no tenderness.  Musculoskeletal: He exhibits no edema.  Lymphadenopathy:    He has no cervical adenopathy.  Neurological:  He is alert. He has normal reflexes.  Skin: Skin is warm and dry. No rash noted.  Psychiatric: He has a normal mood and affect.  Mildly anxious today          Assessment & Plan:   Problem List Items Addressed This Visit    Diabetes type 2, controlled - Primary    Improved Lab Results  Component Value Date   HGBA1C 6.5 06/09/2015   This is much better - after better eating/ 4 lb wt loss and exercise  On glipizide xl 5 Stressed imp of not skipping meals to prev low glucose  Make opth appt for annual exam  F/u 11mo      Obesity    Discussed how this problem influences overall health and the risks it imposes  Reviewed plan for weight loss with lower calorie diet (via better food choices and also portion control or program like weight watchers) and exercise building up to or more than 30 minutes 5 days per week including some aerobic activity   Commended effort so far

## 2015-06-13 NOTE — Assessment & Plan Note (Signed)
Improved Lab Results  Component Value Date   HGBA1C 6.5 06/09/2015   This is much better - after better eating/ 4 lb wt loss and exercise  On glipizide xl 5 Stressed imp of not skipping meals to prev low glucose Make opth appt for annual exam  F/u 68mo

## 2015-06-13 NOTE — Progress Notes (Signed)
Pre visit review using our clinic review tool, if applicable. No additional management support is needed unless otherwise documented below in the visit note. 

## 2015-06-14 ENCOUNTER — Ambulatory Visit (HOSPITAL_COMMUNITY)
Admission: RE | Admit: 2015-06-14 | Discharge: 2015-06-15 | Disposition: A | Discharge status: Home / Self Care | Payer: 59 | Source: Ambulatory Visit | Attending: Internal Medicine | Admitting: Internal Medicine

## 2015-06-14 ENCOUNTER — Encounter (HOSPITAL_COMMUNITY): Payer: Self-pay | Admitting: Internal Medicine

## 2015-06-14 ENCOUNTER — Encounter (HOSPITAL_COMMUNITY)
Admission: RE | Disposition: A | Discharge status: Home / Self Care | Payer: 59 | Source: Ambulatory Visit | Attending: Internal Medicine

## 2015-06-14 DIAGNOSIS — Z87891 Personal history of nicotine dependence: Secondary | ICD-10-CM | POA: Insufficient documentation

## 2015-06-14 DIAGNOSIS — E119 Type 2 diabetes mellitus without complications: Secondary | ICD-10-CM | POA: Diagnosis not present

## 2015-06-14 DIAGNOSIS — I2510 Atherosclerotic heart disease of native coronary artery without angina pectoris: Secondary | ICD-10-CM | POA: Insufficient documentation

## 2015-06-14 DIAGNOSIS — Z959 Presence of cardiac and vascular implant and graft, unspecified: Secondary | ICD-10-CM

## 2015-06-14 DIAGNOSIS — K219 Gastro-esophageal reflux disease without esophagitis: Secondary | ICD-10-CM | POA: Insufficient documentation

## 2015-06-14 DIAGNOSIS — I255 Ischemic cardiomyopathy: Secondary | ICD-10-CM | POA: Insufficient documentation

## 2015-06-14 DIAGNOSIS — Z7902 Long term (current) use of antithrombotics/antiplatelets: Secondary | ICD-10-CM | POA: Diagnosis not present

## 2015-06-14 DIAGNOSIS — E785 Hyperlipidemia, unspecified: Secondary | ICD-10-CM | POA: Insufficient documentation

## 2015-06-14 DIAGNOSIS — I429 Cardiomyopathy, unspecified: Secondary | ICD-10-CM | POA: Diagnosis not present

## 2015-06-14 DIAGNOSIS — I252 Old myocardial infarction: Secondary | ICD-10-CM | POA: Diagnosis not present

## 2015-06-14 DIAGNOSIS — Z7982 Long term (current) use of aspirin: Secondary | ICD-10-CM | POA: Diagnosis not present

## 2015-06-14 DIAGNOSIS — G4733 Obstructive sleep apnea (adult) (pediatric): Secondary | ICD-10-CM | POA: Diagnosis not present

## 2015-06-14 DIAGNOSIS — F329 Major depressive disorder, single episode, unspecified: Secondary | ICD-10-CM | POA: Insufficient documentation

## 2015-06-14 DIAGNOSIS — R9430 Abnormal result of cardiovascular function study, unspecified: Secondary | ICD-10-CM

## 2015-06-14 HISTORY — DX: Presence of automatic (implantable) cardiac defibrillator: Z95.810

## 2015-06-14 HISTORY — DX: ST elevation (STEMI) myocardial infarction involving other coronary artery of anterior wall: I21.09

## 2015-06-14 HISTORY — DX: Heart failure, unspecified: I50.9

## 2015-06-14 HISTORY — DX: Hyperlipidemia, unspecified: E78.5

## 2015-06-14 HISTORY — PX: EP IMPLANTABLE DEVICE: SHX172B

## 2015-06-14 HISTORY — DX: Type 2 diabetes mellitus without complications: E11.9

## 2015-06-14 LAB — GLUCOSE, CAPILLARY
GLUCOSE-CAPILLARY: 150 mg/dL — AB (ref 65–99)
GLUCOSE-CAPILLARY: 108 mg/dL — AB (ref 65–99)
GLUCOSE-CAPILLARY: 106 mg/dL — AB (ref 65–99)
Glucose-Capillary: 196 mg/dL — ABNORMAL HIGH (ref 65–99)

## 2015-06-14 LAB — SURGICAL PCR SCREEN
MRSA, PCR: NEGATIVE
STAPHYLOCOCCUS AUREUS: NEGATIVE

## 2015-06-14 SURGERY — ICD IMPLANT
Anesthesia: LOCAL

## 2015-06-14 MED ORDER — SODIUM CHLORIDE 0.9 % IV SOLN
INTRAVENOUS | Status: DC
  Administered 2015-06-14: 10:00:00 via INTRAVENOUS

## 2015-06-14 MED ORDER — SODIUM CHLORIDE 0.9 % IV SOLN
INTRAVENOUS | Status: AC
  Administered 2015-06-14: 1000 mL via INTRAVENOUS

## 2015-06-14 MED ORDER — HEPARIN (PORCINE) IN NACL 2-0.9 UNIT/ML-% IJ SOLN
INTRAMUSCULAR | Status: AC
  Filled 2015-06-14: qty 500

## 2015-06-14 MED ORDER — METFORMIN HCL 500 MG PO TABS
1000.0000 mg | ORAL_TABLET | Freq: Two times a day (BID) | ORAL | Status: DC
  Filled 2015-06-14 (×3): qty 2

## 2015-06-14 MED ORDER — CEFAZOLIN SODIUM-DEXTROSE 2-3 GM-% IV SOLR: 2.0000 g | INTRAVENOUS | Status: DC

## 2015-06-14 MED ORDER — ACETAMINOPHEN 500 MG PO TABS: 500.0000 mg | ORAL_TABLET | Freq: Four times a day (QID) | ORAL | Status: DC | PRN

## 2015-06-14 MED ORDER — VITAMIN B-12 1000 MCG PO TABS
1000.0000 ug | ORAL_TABLET | Freq: Every day | ORAL | Status: DC
  Administered 2015-06-14 – 2015-06-15 (×2): 1000 ug via ORAL
  Filled 2015-06-14 (×2): qty 1

## 2015-06-14 MED ORDER — MUPIROCIN 2 % EX OINT
1.0000 "application " | TOPICAL_OINTMENT | Freq: Once | CUTANEOUS | Status: AC
  Administered 2015-06-14: 1 via TOPICAL
  Filled 2015-06-14: qty 22

## 2015-06-14 MED ORDER — CARVEDILOL 12.5 MG PO TABS
12.5000 mg | ORAL_TABLET | Freq: Two times a day (BID) | ORAL | Status: DC
  Administered 2015-06-14 – 2015-06-15 (×2): 12.5 mg via ORAL
  Filled 2015-06-14 (×2): qty 1

## 2015-06-14 MED ORDER — LIDOCAINE HCL (PF) 1 % IJ SOLN
INTRAMUSCULAR | Status: DC | PRN
  Administered 2015-06-14: 54 mL via SUBCUTANEOUS

## 2015-06-14 MED ORDER — ACETAMINOPHEN 325 MG PO TABS
325.0000 mg | ORAL_TABLET | ORAL | Status: DC | PRN
  Administered 2015-06-15: 650 mg via ORAL
  Filled 2015-06-14: qty 2

## 2015-06-14 MED ORDER — CEFAZOLIN SODIUM-DEXTROSE 2-3 GM-% IV SOLR
INTRAVENOUS | Status: DC | PRN
  Administered 2015-06-14: 2 g via INTRAVENOUS

## 2015-06-14 MED ORDER — FENTANYL CITRATE (PF) 100 MCG/2ML IJ SOLN
INTRAMUSCULAR | Status: DC | PRN
  Administered 2015-06-14: 50 ug via INTRAVENOUS
  Administered 2015-06-14: 25 ug via INTRAVENOUS

## 2015-06-14 MED ORDER — LIDOCAINE HCL (PF) 1 % IJ SOLN
INTRAMUSCULAR | Status: AC
  Filled 2015-06-14: qty 60

## 2015-06-14 MED ORDER — CEFAZOLIN SODIUM-DEXTROSE 2-3 GM-% IV SOLR
INTRAVENOUS | Status: AC
  Filled 2015-06-14: qty 50

## 2015-06-14 MED ORDER — ATORVASTATIN CALCIUM 80 MG PO TABS
80.0000 mg | ORAL_TABLET | Freq: Every day | ORAL | Status: DC
  Administered 2015-06-14: 80 mg via ORAL
  Filled 2015-06-14: qty 1

## 2015-06-14 MED ORDER — FENTANYL CITRATE (PF) 100 MCG/2ML IJ SOLN
INTRAMUSCULAR | Status: AC
  Filled 2015-06-14: qty 2

## 2015-06-14 MED ORDER — GLIPIZIDE ER 5 MG PO TB24
5.0000 mg | ORAL_TABLET | Freq: Every day | ORAL | Status: DC
Start: 2015-06-15 — End: 2015-06-15
  Filled 2015-06-14 (×2): qty 1

## 2015-06-14 MED ORDER — MIDAZOLAM HCL 5 MG/5ML IJ SOLN
INTRAMUSCULAR | Status: DC | PRN
  Administered 2015-06-14: 2 mg via INTRAVENOUS
  Administered 2015-06-14 (×2): 1 mg via INTRAVENOUS

## 2015-06-14 MED ORDER — SODIUM CHLORIDE 0.9 % IR SOLN
80.0000 mg | Status: AC
  Administered 2015-06-14: 80 mg

## 2015-06-14 MED ORDER — ASPIRIN 81 MG PO CHEW
81.0000 mg | CHEWABLE_TABLET | Freq: Every day | ORAL | Status: DC
Start: 2015-06-14 — End: 2015-06-15
  Administered 2015-06-14 – 2015-06-15 (×2): 81 mg via ORAL
  Filled 2015-06-14 (×4): qty 1

## 2015-06-14 MED ORDER — MIDAZOLAM HCL 5 MG/5ML IJ SOLN
INTRAMUSCULAR | Status: AC
Start: 2015-06-14 — End: 2015-06-14
  Filled 2015-06-14: qty 5

## 2015-06-14 MED ORDER — SODIUM CHLORIDE 0.9 % IR SOLN
Status: AC
  Filled 2015-06-14: qty 2

## 2015-06-14 MED ORDER — CLOPIDOGREL BISULFATE 75 MG PO TABS
75.0000 mg | ORAL_TABLET | Freq: Once | ORAL | Status: AC
  Administered 2015-06-14: 75 mg via ORAL
  Filled 2015-06-14: qty 1

## 2015-06-14 MED ORDER — ONDANSETRON HCL 4 MG/2ML IJ SOLN: 4.0000 mg | Freq: Four times a day (QID) | INTRAMUSCULAR | Status: DC | PRN

## 2015-06-14 MED ORDER — RAMIPRIL 5 MG PO CAPS
5.0000 mg | ORAL_CAPSULE | Freq: Every day | ORAL | Status: DC
  Administered 2015-06-14 – 2015-06-15 (×2): 5 mg via ORAL
  Filled 2015-06-14 (×2): qty 1

## 2015-06-14 MED ORDER — CEFAZOLIN SODIUM 1-5 GM-% IV SOLN
1.0000 g | Freq: Four times a day (QID) | INTRAVENOUS | Status: AC
  Administered 2015-06-14 – 2015-06-15 (×3): 1 g via INTRAVENOUS
  Filled 2015-06-14 (×4): qty 50

## 2015-06-14 MED ORDER — MUPIROCIN 2 % EX OINT
TOPICAL_OINTMENT | CUTANEOUS | Status: AC
  Filled 2015-06-14: qty 22

## 2015-06-14 SURGICAL SUPPLY — 6 items
CABLE SURGICAL S-101-97-12 (CABLE) ×1 IMPLANT
ICD EVERA VR XT MRI DVMB1D4 (ICD Generator) ×1 IMPLANT
LEAD SPRINT QUAT SEC 6935M-62 (Lead) ×1 IMPLANT
PAD DEFIB LIFELINK (PAD) ×1 IMPLANT
SHEATH CLASSIC 9F (SHEATH) ×1 IMPLANT
TRAY PACEMAKER INSERTION (CUSTOM PROCEDURE TRAY) ×1 IMPLANT

## 2015-06-14 NOTE — H&P (View-Only) (Signed)
ELECTROPHYSIOLOGY CONSULT NOTE  Patient ID: Harry Payne, MRN: [id number], DOB/AGE: 1951/06/22 64 y.o. Admit date: (Not on file) Date of Consult: 06/06/2015  Primary Physician: Loura Pardon, MD Primary Cardiologist: CHF  Chief Complaint: ICD   HPI Harry Payne is a 64 y.o. male   with CAD s/p anterior MI 2004 (s/p stenting LAD and LCX), DM2, and hyperlipidemia (previously in AIM-HIGH trial). EF 48% by MR. Myoview 2009 EF 43%. anterior apical scar. no ischemia.   He presented to [facility]/16/11 with chest pain. He ruled out for myocardial infarction. Cardiac catheterization done 02/18/2011: Proximal LAD stent patent; midcircumflex 25%, circumflex stent patent; RCA 25% of the PDA, proximal PDA 25%; posterior lateral 25-30%; EF 50% with anterior dyskinesis.   Echo repeated 5/16 EF 30% or so  Mild Sx of DOE  Esp with bending No palps, syncope, edema PD or orthopnea  Past Medical History  Diagnosis Date  . Anxiety   . CAD (coronary artery disease)   . GERD (gastroesophageal reflux disease)   . Other and unspecified hyperlipidemia   . Depression   . Hypotension   . Hx of adenomatous colonic polyps   . Hemorrhoids   . DM (diabetes mellitus)       Surgical History:  Past Surgical History  Procedure Laterality Date  . Closed reduction hand fracture Left 1990    hand  . Coronary angioplasty with stent placement      x 4     Home Meds: Prior to Admission medications   Medication Sig Start Date End Date Taking? Authorizing Provider  aspirin 81 MG tablet Take 81 mg by mouth daily.    Yes Historical Provider, MD  atorvastatin (LIPITOR) 80 MG tablet Take 1 tablet (80 mg total) by mouth daily. 04/11/15  Yes Shaune Pascal Bensimhon, MD  carvedilol (COREG) 25 MG tablet TAKE 1 TABLET (25 MG TOTAL) BY MOUTH 2 (TWO) TIMES DAILY WITH A MEAL. 01/09/15  Yes Abner Greenspan, MD  clopidogrel (PLAVIX) 75 MG tablet TAKE 1 TABLET (75 MG TOTAL) BY MOUTH DAILY. 12/19/14  Yes Shaune Pascal  Bensimhon, MD  glipiZIDE (GLUCOTROL XL) 5 MG 24 hr tablet TAKE 1 TABLET (5 MG TOTAL) BY MOUTH DAILY WITH BREAKFAST. 05/05/15  Yes Marne A Tower, MD  glucose blood (ONE TOUCH ULTRA TEST) test strip CHECK BLOOD SUGAR ONCE DAY AS NEEDED FOR DM E11.9 11/07/14  Yes Abner Greenspan, MD  metFORMIN (GLUCOPHAGE) 1000 MG tablet TAKE 1 TABLET (1,000 MG TOTAL) BY MOUTH 2 (TWO) TIMES DAILY WITH A MEAL. 03/13/15  Yes Marne A Tower, MD  NITROSTAT 0.4 MG SL tablet PLACE 1 TABLET (0.4 MG TOTAL) UNDER THE TONGUE EVERY 5 (FIVE) MINUTES AS NEEDED. 11/01/14  Yes Abner Greenspan, MD  Omega-3 Fatty Acids (FISH OIL) 1000 MG CAPS Take 1 capsule by mouth daily.    Yes Historical Provider, MD  ramipril (ALTACE) 5 MG capsule TAKE 1 CAPSULE (5 MG TOTAL) BY MOUTH DAILY. 03/10/15  Yes Abner Greenspan, MD  ranitidine (ZANTAC) 75 MG tablet Take 75 mg by mouth 2 (two) times daily.   Yes Historical Provider, MD  vitamin B-12 (CYANOCOBALAMIN) 1000 MCG tablet Take 1,000 mcg by mouth daily.     Yes Historical Provider, MD     Allergies: No Known Allergies  History   Social History  . Marital Status: Married    Spouse Name: N/A  . Number of Children: 3  . Years of Education: N/A  Occupational History  . 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 Smoking status: Former Smoker    Types: Cigarettes    Quit date: 12/30/2001  . Smokeless tobacco: Never Used     Comment: over 40yrs ago  . Alcohol Use: No  . Drug Use: No  . Sexual Activity: Not on file   Other Topics Concern  . Not on file   Social History Narrative     Family History  Problem Relation Age of Onset  . Prostate cancer Father   . Diabetes Brother     x3  . Heart disease Brother      ROS:  Please see the history of present illness.     All other systems reviewed and negative.    Physical Exam   Blood pressure 109/60, pulse 70, height 5\' 11"  (1.803 m), weight 229 lb 9.6 oz (104.146 kg). General: Well developed, well nourished male in no  acute distress. Head: Normocephalic, atraumatic, sclera non-icteric, no xanthomas, nares are without discharge. EENT: normal Lymph Nodes:  none Back: without scoliosis/kyphosis , no CVA tendersness Neck: Negative for carotid bruits. JVD not elevated. Lungs: Clear bilaterally to auscultation without wheezes, rales, or rhonchi. Breathing is unlabored. Heart: RRR with S1 S2. No*  murmur , rubs, or gallops appreciated. Abdomen: Soft, non-tender, non-distended with normoactive bowel sounds. No hepatomegaly. No rebound/guarding. No obvious abdominal masses. Msk:  Strength and tone appear normal for age. Extremities: No clubbing or cyanosis. No edema.  Distal pedal pulses are 2+ and equal bilaterally. Skin: Warm and Dry Neuro: Alert and oriented X 3. CN III-XII intact Grossly normal sensory and motor function . Psych:  Responds to questions appropriately with a normal affect.      Labs: Cardiac Enzymes No results for input(s): CKTOTAL, CKMB, TROPONINI in the last 72 hours. CBC Lab Results  Component Value Date   WBC 8.0 03/11/2014   HGB 15.1 03/11/2014   HCT 45.1 03/11/2014   MCV 96.4 03/11/2014   PLT 255.0 03/11/2014   PROTIME: No results for input(s): LABPROT, INR in the last 72 hours. Chemistry No results for input(s): NA, K, CL, CO2, BUN, CREATININE, CALCIUM, PROT, BILITOT, ALKPHOS, ALT, AST, GLUCOSE in the last 168 hours.  Invalid input(s): LABALBU Lipids Lab Results  Component Value Date   CHOL 148 03/06/2015   HDL 28.20* 03/06/2015   LDLCALC 89 03/06/2015   TRIG 155.0* 03/06/2015   BNP No results found for: PROBNP Thyroid Function Tests: No results for input(s): TSH, T4TOTAL, T3FREE, THYROIDAB in the last 72 hours.  Invalid input(s): FREET3    Miscellaneous No results found for: DDIMER  Radiology/Studies:  No results found.  EKG: NSR 70 15/08.37   Assessment and Plan:  Ischemic cardiomyopathy  Obstructive sleep apnea  Pt has persistent LV dysfunction  wth ischemic cardiomyopathy and mild symptoms of cHF  He is candidate for ICD for primary prevention  Have reviewed the potential benefits and risks of ICD implantation including but not limited to death, perforation of heart or lung, lead dislodgement, infection,  device malfunction and inappropriate shocks.  The patient and family express understanding  and are willing to proceed.    He also needs slepp study for OSA   Virl Axe

## 2015-06-14 NOTE — Discharge Summary (Signed)
ELECTROPHYSIOLOGY PROCEDURE DISCHARGE SUMMARY    Patient ID: Harry Payne,  MRN: [id number], DOB/AGE: 63-21-1952 63 y.o.  Admit date: 06/14/2015 Discharge date: 06/15/2015  Primary Care Physician: Loura Pardon, MD Primary Cardiologist: CHF clinic Electrophysiologist: Caryl Comes  Primary Discharge Diagnosis:  Ischemic cardiomyopathy status post ICD implant this admission  Secondary Discharge Diagnosis:  1.  CAD s/p anterior MI 2004 2.  Diabetes 3.  Hyperlipidemia 4.  Depression 5.  GERD  No Known Allergies   Procedures This Admission:  1.  Implantation of a MDT single chamber ICD on 06/14/15 by Dr Caryl Comes.  See op note for full details.  There were no immediate post procedure complications. 2.  CXR on 06/15/15 demonstrated no pneumothorax status post device implantation.   Brief HPI: Harry Payne is a 64 y.o. male was referred to electrophysiology in the outpatient setting for consideration of ICD implantation.  Past medical history includes CAD and ischemic cardiomyopathy.  The patient has persistent LV dysfunction despite guideline directed therapy.  Risks, benefits, and alternatives to ICD implantation were reviewed with the patient who wished to proceed.   Hospital Course:  The patient was admitted and underwent implantation of a MDT MRI compatible single chamber ICD with details as outlined above. He was monitored on telemetry overnight which demonstrated sinus rhythm.  Left chest was without hematoma or ecchymosis.  The device was interrogated and found to be functioning normally.  CXR was obtained and demonstrated no pneumothorax status post device implantation.  Wound care, arm mobility, and restrictions were reviewed with the patient.  The patient was examined and considered stable for discharge to home.   The patient's discharge medications include an ACE-I (Ramipril) and beta blocker (Coreg).   Physical Exam: Filed Vitals:   06/14/15 1629 06/14/15 1700 06/14/15 2130  06/15/15 0628  BP:  105/58 123/69 141/89  Pulse:   68 66  Temp: 97.5 F (36.4 C)  98 F (36.7 C) 97.9 F (36.6 C)  TempSrc: Oral  Oral Oral  Resp:  20 18 16   Height:      Weight:    221 lb 12.8 oz (100.608 kg)  SpO2:   95% 97%    GEN- The patient is well appearing, alert and oriented x 3 today.   HEENT: normocephalic, atraumatic; sclera clear, conjunctiva pink; hearing intact; oropharynx clear; neck supple, no JVP Lymph- no cervical lymphadenopathy Lungs- Clear to ausculation bilaterally, normal work of breathing.  No wheezes, rales, rhonchi Heart- Regular rate and rhythm, no murmurs, rubs or gallops, PMI not laterally displaced GI- soft, non-tender, non-distended, bowel sounds present, no hepatosplenomegaly Extremities- no clubbing, cyanosis, or edema; DP/PT/radial pulses 2+ bilaterally MS- no significant deformity or atrophy Skin- warm and dry, no rash or lesion, left chest without hematoma/ecchymosis Psych- euthymic mood, full affect Neuro- strength and sensation are intact   Labs:   Lab Results  Component Value Date   WBC 9.8 06/06/2015   HGB 15.0 06/06/2015   HCT 44.7 06/06/2015   MCV 95.4 06/06/2015   PLT 283.0 06/06/2015     Recent Labs Lab 06/09/15 0836  NA 137  K 4.4  CL 103  CO2 28  BUN 18  CREATININE 0.98  CALCIUM 9.7  GLUCOSE 146*    Discharge Medications:    Medication List    TAKE these medications        acetaminophen 500 MG tablet  Commonly known as:  TYLENOL  Take 500 mg by mouth every 6 (six) hours as  needed for mild pain or moderate pain.     aspirin 81 MG tablet  Take 81 mg by mouth daily.     atorvastatin 80 MG tablet  Commonly known as:  LIPITOR  Take 1 tablet (80 mg total) by mouth daily.     carvedilol 25 MG tablet  Commonly known as:  COREG  TAKE 1 TABLET (25 MG TOTAL) BY MOUTH 2 (TWO) TIMES DAILY WITH A MEAL.     clopidogrel 75 MG tablet  Commonly known as:  PLAVIX  TAKE 1 TABLET (75 MG TOTAL) BY MOUTH DAILY.      Fish Oil 1000 MG Caps  Take 1 capsule by mouth daily.     glipiZIDE 5 MG 24 hr tablet  Commonly known as:  GLUCOTROL XL  TAKE 1 TABLET (5 MG TOTAL) BY MOUTH DAILY WITH BREAKFAST.     glucose blood test strip  Commonly known as:  ONE TOUCH ULTRA TEST  CHECK BLOOD SUGAR ONCE DAY AS NEEDED FOR DM E11.9     metFORMIN 1000 MG tablet  Commonly known as:  GLUCOPHAGE  TAKE 1 TABLET (1,000 MG TOTAL) BY MOUTH 2 (TWO) TIMES DAILY WITH A MEAL.     NITROSTAT 0.4 MG SL tablet  Generic drug:  nitroGLYCERIN  PLACE 1 TABLET (0.4 MG TOTAL) UNDER THE TONGUE EVERY 5 (FIVE) MINUTES AS NEEDED.     ramipril 5 MG capsule  Commonly known as:  ALTACE  TAKE 1 CAPSULE (5 MG TOTAL) BY MOUTH DAILY.     ranitidine 75 MG tablet  Commonly known as:  ZANTAC  Take 75 mg by mouth 2 (two) times daily.     vitamin B-12 1000 MCG tablet  Commonly known as:  CYANOCOBALAMIN  Take 1,000 mcg by mouth daily.        Disposition:  Discharge Instructions    Diet - low sodium heart healthy    Complete by:  As directed      Increase activity slowly    Complete by:  As directed           Follow-up Information    Follow up with [facility] On 06/26/2015.   Why:  at [facility] for wound check   Contact information:   [place] [place] [address]       Duration of Discharge Encounter: Greater than 30 minutes including physician time.  Signed, Chanetta Marshall, NP 06/15/2015 8:55 AM

## 2015-06-14 NOTE — Discharge Instructions (Signed)
° ° °  Supplemental Discharge Instructions for  Pacemaker/Defibrillator Patients  Activity No heavy lifting or vigorous activity with your left/right arm for 6 to 8 weeks.  Do not raise your left/right arm above your head for one week.  Gradually raise your affected arm as drawn below.           __       06/19/15                  06/20/15                     06/21/15                   06/22/15  NO DRIVING for 1 week    ; you may begin driving on   7/41/[birth year]  .  WOUND CARE - Keep the wound area clean and dry.  Do not get this area wet for one week. No showers for one week; you may shower on  06/22/15   . - The tape/steri-strips on your wound will fall off; do not pull them off.  No bandage is needed on the site.  DO  NOT apply any creams, oils, or ointments to the wound area. - If you notice any drainage or discharge from the wound, any swelling or bruising at the site, or you develop a fever > 101? F after you are discharged home, call the office at once.  Special Instructions - You are still able to use cellular telephones; use the ear opposite the side where you have your pacemaker/defibrillator.  Avoid carrying your cellular phone near your device. - When traveling through airports, show security personnel your identification card to avoid being screened in the metal detectors.  Ask the security personnel to use the hand wand. - Avoid arc welding equipment, MRI testing (magnetic resonance imaging), TENS units (transcutaneous nerve stimulators).  Call the office for questions about other devices. - Avoid electrical appliances that are in poor condition or are not properly grounded. - Microwave ovens are safe to be near or to operate.  Additional information for defibrillator patients should your device go off: - If your device goes off ONCE and you feel fine afterward, notify the device clinic nurses. - If your device goes off ONCE and you do not feel well afterward, call 911. - If your device  goes off TWICE, call 911. - If your device goes off THREE times in one day, call 911.  DO NOT DRIVE YOURSELF OR A FAMILY MEMBER WITH A DEFIBRILLATOR TO THE HOSPITAL--CALL 911.

## 2015-06-14 NOTE — Interval H&P Note (Signed)
ICD Criteria  Current LVEF:30% ;Obtained > or = 1 month ago and < or = 3 months ago.  NYHA Functional Classification: Class II  Heart Failure History:  Yes, Duration of heart failure since onset is > 9 months  Non-Ischemic Dilated Cardiomyopathy History:  No.  Atrial Fibrillation/Atrial Flutter:  No.  Ventricular Tachycardia History:  No.  Cardiac Arrest History:  No  History of Syndromes with Risk of Sudden Death:  No.  Previous ICD:  No.  Electrophysiology Study: No.  Prior MI: Yes, Most recent MI timeframe is > 40 days.  PPM: No.  OSA:  No  Patient Life Expectancy of >=1 year: Yes.  Anticoagulation Therapy:  Patient is NOT on anticoagulation therapy.   Beta Blocker Therapy:  Yes.   Ace Inhibitor/ARB Therapy:  Yes.History and Physical Interval Note:  06/14/2015 10:49 AM  Harry Payne  has presented today for surgery, with the diagnosis of ischemic cardiomyopathy  The various methods of treatment have been discussed with the patient and family. After consideration of risks, benefits and other options for treatment, the patient has consented to  Procedure(s): ICD Implant (N/A) as a surgical intervention .  The patient's history has been reviewed, patient examined, no change in status, stable for surgery.  I have reviewed the patient's chart and labs.  Questions were answered to the patient's satisfaction.     Harry Payne

## 2015-06-15 ENCOUNTER — Ambulatory Visit (HOSPITAL_COMMUNITY): Payer: 59

## 2015-06-15 DIAGNOSIS — I255 Ischemic cardiomyopathy: Secondary | ICD-10-CM | POA: Diagnosis not present

## 2015-06-15 DIAGNOSIS — I2510 Atherosclerotic heart disease of native coronary artery without angina pectoris: Secondary | ICD-10-CM | POA: Diagnosis not present

## 2015-06-15 DIAGNOSIS — E785 Hyperlipidemia, unspecified: Secondary | ICD-10-CM | POA: Diagnosis not present

## 2015-06-15 DIAGNOSIS — I252 Old myocardial infarction: Secondary | ICD-10-CM | POA: Diagnosis not present

## 2015-06-15 LAB — GLUCOSE, CAPILLARY: Glucose-Capillary: 133 mg/dL — ABNORMAL HIGH (ref 65–99)

## 2015-06-15 MED FILL — Heparin Sodium (Porcine) 2 Unit/ML in Sodium Chloride 0.9%: INTRAMUSCULAR | Qty: 500 | Status: AC

## 2015-06-15 MED FILL — Lidocaine HCl Local Preservative Free (PF) Inj 1%: INTRAMUSCULAR | Qty: 30 | Status: AC

## 2015-06-26 ENCOUNTER — Ambulatory Visit (INDEPENDENT_AMBULATORY_CARE_PROVIDER_SITE_OTHER): Payer: 59 | Admitting: *Deleted

## 2015-06-26 DIAGNOSIS — I255 Ischemic cardiomyopathy: Secondary | ICD-10-CM

## 2015-06-27 LAB — CUP PACEART INCLINIC DEVICE CHECK
Battery Remaining Longevity: 138 mo
Battery Voltage: 3.15 V
Date Time Interrogation Session: 20160627192159
HIGH POWER IMPEDANCE MEASURED VALUE: 190 Ohm
HIGH POWER IMPEDANCE MEASURED VALUE: 79 Ohm
Lead Channel Impedance Value: 532 Ohm
Lead Channel Pacing Threshold Amplitude: 0.625 V
Lead Channel Pacing Threshold Pulse Width: 0.4 ms
Lead Channel Sensing Intrinsic Amplitude: 26.625 mV
Lead Channel Sensing Intrinsic Amplitude: 31.625 mV
Lead Channel Setting Pacing Amplitude: 3.5 V
Lead Channel Setting Pacing Pulse Width: 0.4 ms
MDC IDC SET LEADCHNL RV SENSING SENSITIVITY: 0.3 mV
MDC IDC SET ZONE DETECTION INTERVAL: 340 ms
MDC IDC STAT BRADY RV PERCENT PACED: 0.01 %
Zone Setting Detection Interval: 300 ms
Zone Setting Detection Interval: 360 ms

## 2015-06-27 NOTE — Progress Notes (Signed)
Wound check appointment. Wound without redness or edema. Incision edges approximated, wound well healed. Normal device function. Threshold, sensing, and impedances consistent with implant measurements. Device programmed at 3.5V for extra safety margin until 3 month visit. Histogram distribution appropriate for patient and level of activity. No ventricular arrhythmias noted. Patient educated about wound care, arm mobility, lifting restrictions, shock plan. ROV in 3 months with SK. 

## 2015-07-11 ENCOUNTER — Telehealth: Payer: Self-pay | Admitting: Internal Medicine

## 2015-07-11 NOTE — Telephone Encounter (Signed)
Patient requesting letter from Dr. Caryl Comes explaining his "disablities", things he is unable to be around (etc: high voltage, electric motors, magnets) and things he is unable to do. 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 Informed Dr Caryl Comes is currently out of the country and I will address with him when he returns next week. Patient verbalized understanding and agreeable to above plan.

## 2015-07-11 NOTE — Telephone Encounter (Signed)
New message       Pt want a report on what he had done---defibulator placement and 2 heart attack with decreased heart function.  Pt is trying to get disability.  Please call when it is ready

## 2015-07-20 ENCOUNTER — Encounter: Payer: Self-pay | Admitting: Internal Medicine

## 2015-07-20 ENCOUNTER — Encounter: Payer: Self-pay | Admitting: *Deleted

## 2015-07-20 NOTE — Telephone Encounter (Signed)
Call Documentation      Charlett Nose at 07/20/2015 11:08 AM     Status: Signed       Expand All Collapse All   New Message    Pt calling he states he is returning RN call Please call pt

## 2015-07-20 NOTE — Telephone Encounter (Signed)
New Message    Pt calling he states he is returning RN call Please call pt

## 2015-07-20 NOTE — Telephone Encounter (Signed)
This encounter was created in error - please disregard.

## 2015-07-20 NOTE — Telephone Encounter (Signed)
Informed letter will state the concern between ICD and electromagnetic fields. Pt aware letter left at front desk for him to pick up at his convenience.  He verbalized understanding and thanks me for helping.

## 2015-08-04 ENCOUNTER — Other Ambulatory Visit: Payer: Self-pay | Admitting: Internal Medicine

## 2015-08-07 ENCOUNTER — Encounter: Payer: Self-pay | Admitting: Internal Medicine

## 2015-08-17 ENCOUNTER — Other Ambulatory Visit (HOSPITAL_COMMUNITY): Payer: Self-pay | Admitting: Internal Medicine

## 2015-08-20 ENCOUNTER — Other Ambulatory Visit: Payer: Self-pay | Admitting: Family Medicine

## 2015-08-29 ENCOUNTER — Encounter (HOSPITAL_COMMUNITY): Payer: Self-pay

## 2015-08-29 NOTE — Progress Notes (Signed)
Thayne DDS [facility] sent medical record request for records 01/30/2013 - present. All records available by our CHF clinic and/or providers faxed to [phone]. Case # [id number] Copy of medical record request scanned into electronic medical records.  Renee Pain

## 2015-09-29 ENCOUNTER — Encounter: Payer: Self-pay | Admitting: Internal Medicine

## 2015-09-29 ENCOUNTER — Ambulatory Visit (INDEPENDENT_AMBULATORY_CARE_PROVIDER_SITE_OTHER): Payer: 59 | Admitting: Internal Medicine

## 2015-09-29 VITALS — BP 106/78 | HR 73 | Ht 71.0 in | Wt 228.6 lb

## 2015-09-29 DIAGNOSIS — I255 Ischemic cardiomyopathy: Secondary | ICD-10-CM | POA: Diagnosis not present

## 2015-09-29 DIAGNOSIS — Z4502 Encounter for adjustment and management of automatic implantable cardiac defibrillator: Secondary | ICD-10-CM

## 2015-09-29 NOTE — Patient Instructions (Addendum)
Medication Instructions:  Your physician recommends that you continue on your current medications as directed. Please refer to the Current Medication list given to you today.  Labwork: None ordered  Testing/Procedures: None ordered  Follow-Up: Your physician recommends that you schedule a follow-up appointment in: 3 months with Heart Failure Clinic.  Remote monitoring is used to monitor your Pacemaker of ICD from home. This monitoring reduces the number of office visits required to check your device to one time per year. It allows [place] to keep an eye on the functioning of your device to ensure it is working properly. You are scheduled for a device check from home on 01/02/16. You may send your transmission at any time that day. If you have a wireless device, the transmission will be sent automatically. After your physician reviews your transmission, you will receive a postcard with your next transmission date.  Your physician wants you to follow-up in: 9 months with Dr. Caryl Comes.  You will receive a reminder letter in the mail two months in advance. If you don't receive a letter, please call our office to schedule the follow-up appointment.    Any Other Special Instructions Will Be Listed Below (If Applicable). Thank you for choosing [facility]!!

## 2015-09-29 NOTE — Progress Notes (Signed)
Patient Care Team: Abner Greenspan, MD as PCP - General   HPI  Harry Payne is a 64 y.o. male  Seen in follow-up for an ICD implanted for primary prevention in 6/16.   He has a history of CAD s/p anterior MI 2004 (s/p stenting LAD and LCX), DM2, and hyperlipidemia (previously in AIM-HIGH trial). EF 48% by MR. Myoview 2009 EF 43%. anterior apical scar. no ischemia.    Echo repeated 5/16 EF 30% or so  Mild Sx of DOE    He has had a fair amount of anxiety related to the presence of the defibrillator  Past Medical History  Diagnosis Date  . CAD (coronary artery disease)   . GERD (gastroesophageal reflux disease)   . Hx of adenomatous colonic polyps   . Hemorrhoids   . AICD (automatic cardioverter/defibrillator) present   . Hypotension   . CHF (congestive heart failure)   . Anterior myocardial infarction 2004 X 2  . Hyperlipidemia   . Type II diabetes mellitus     Past Surgical History  Procedure Laterality Date  . Closed reduction hand fracture Left 1990  . Ep implantable device N/A 06/14/2015    Procedure: ICD Implant;  Surgeon: Deboraha Sprang, MD;  Location: [facility];  Service: Cardiovascular;  Laterality: N/A;  . Fracture surgery    . Coronary angioplasty with stent placement  2004    "2"  . Cardiac catheterization  2012    "no stent"    Current Outpatient Prescriptions  Medication Sig Dispense Refill  . acetaminophen (TYLENOL) 500 MG tablet Take 500 mg by mouth every 6 (six) hours as needed for mild pain or moderate pain.    Marland Kitchen aspirin 81 MG tablet Take 81 mg by mouth daily.     Marland Kitchen atorvastatin (LIPITOR) 80 MG tablet TAKE 1 TABLET (80 MG TOTAL) BY MOUTH DAILY. 30 tablet 3  . carvedilol (COREG) 25 MG tablet TAKE 1 TABLET (25 MG TOTAL) BY MOUTH 2 (TWO) TIMES DAILY WITH A MEAL. 60 tablet 5  . clopidogrel (PLAVIX) 75 MG tablet TAKE 1 TABLET (75 MG TOTAL) BY MOUTH DAILY. 30 tablet 6  . glipiZIDE (GLUCOTROL XL) 5 MG 24 hr tablet TAKE 1 TABLET (5 MG TOTAL)  BY MOUTH DAILY WITH BREAKFAST. 30 tablet 5  . glucose blood (ONE TOUCH ULTRA TEST) test strip CHECK BLOOD SUGAR ONCE DAY AS NEEDED FOR DM E11.9 100 each 0  . metFORMIN (GLUCOPHAGE) 1000 MG tablet TAKE 1 TABLET (1,000 MG TOTAL) BY MOUTH 2 (TWO) TIMES DAILY WITH A MEAL. 60 tablet 11  . NITROSTAT 0.4 MG SL tablet PLACE 1 TABLET (0.4 MG TOTAL) UNDER THE TONGUE EVERY 5 (FIVE) MINUTES AS NEEDED. (Patient taking differently: PLACE 1 TABLET (0.4 MG TOTAL) UNDER THE TONGUE EVERY 5 (FIVE) MINUTES AS NEEDED FOR CHEST PAIN) 25 tablet 4  . Omega-3 Fatty Acids (FISH OIL) 1000 MG CAPS Take 1 capsule by mouth daily.     . ramipril (ALTACE) 5 MG capsule TAKE 1 CAPSULE (5 MG TOTAL) BY MOUTH DAILY. 90 capsule 3  . ranitidine (ZANTAC) 150 MG tablet TAKE 1 TABLET (150 MG TOTAL) BY MOUTH 2 (TWO) TIMES DAILY. 180 tablet 1  . vitamin B-12 (CYANOCOBALAMIN) 1000 MCG tablet Take 1,000 mcg by mouth daily.      . [DISCONTINUED] pantoprazole (PROTONIX) 40 MG tablet Take 1 tablet (40 mg total) by mouth daily. 30 tablet 11   No current facility-administered medications for this visit.  No Known Allergies    Review of Systems negative except from HPI and PMH  Physical Exam BP 106/78 mmHg  Pulse 73  Ht 5\' 11"  (1.803 m)  Wt 228 lb 9.6 oz (103.692 kg)  BMI 31.90 kg/m2 Well developed and well nourished in no acute distress HENT normal E scleral and icterus clear Neck Supple JVP flat; carotids brisk and full Clear to ausculation Device pocket well healed; without hematoma or erythema.  There is no tethering   Regular rate and rhythm, no murmurs gallops or rub Soft with active bowel sounds No clubbing cyanosis  Edema Alert and oriented, grossly normal motor and sensory function Skin Warm and Dry  ECG dated today demonstrates sinus rhythm at 72+ intervals 15/09/36 Axis is 60 Prior anteroseptal MI  Assessment and  Plan  Ischemic cardiomyopathy  Congestive heart failure-chronic-systolic-class II  Implantable  defibrillator-[employer]tronic-single chamber  Anxiety   We have discussed the role of the defibrillator and instability. I encouraged her to go back to exercise.  His functional status is sufficiently good but there is no indication to use either Entresto or Aldactone. We will continue him on his current medications.

## 2015-10-02 LAB — CUP PACEART INCLINIC DEVICE CHECK
Battery Remaining Longevity: 137 mo
Date Time Interrogation Session: 20160930193826
HighPow Impedance: 190 Ohm
HighPow Impedance: 93 Ohm
Lead Channel Pacing Threshold Pulse Width: 0.4 ms
Lead Channel Setting Pacing Amplitude: 2.5 V
Lead Channel Setting Pacing Pulse Width: 0.4 ms
Lead Channel Setting Sensing Sensitivity: 0.3 mV
MDC IDC MSMT BATTERY VOLTAGE: 3.14 V
MDC IDC MSMT LEADCHNL RV IMPEDANCE VALUE: 608 Ohm
MDC IDC MSMT LEADCHNL RV PACING THRESHOLD AMPLITUDE: 0.5 V
MDC IDC MSMT LEADCHNL RV SENSING INTR AMPL: 31.375 mV
MDC IDC SET ZONE DETECTION INTERVAL: 300 ms
MDC IDC STAT BRADY RV PERCENT PACED: 0.01 %
Zone Setting Detection Interval: 340 ms
Zone Setting Detection Interval: 360 ms

## 2015-10-17 ENCOUNTER — Encounter: Payer: Self-pay | Admitting: Internal Medicine

## 2015-10-18 DIAGNOSIS — Z736 Limitation of activities due to disability: Secondary | ICD-10-CM

## 2015-11-11 ENCOUNTER — Other Ambulatory Visit: Payer: Self-pay | Admitting: Family Medicine

## 2015-11-28 ENCOUNTER — Encounter: Payer: Self-pay | Admitting: Internal Medicine

## 2015-12-08 ENCOUNTER — Other Ambulatory Visit (INDEPENDENT_AMBULATORY_CARE_PROVIDER_SITE_OTHER): Payer: 59

## 2015-12-08 DIAGNOSIS — E119 Type 2 diabetes mellitus without complications: Secondary | ICD-10-CM

## 2015-12-08 LAB — COMPREHENSIVE METABOLIC PANEL
ALBUMIN: 4 g/dL (ref 3.5–5.2)
ALT: 25 U/L (ref 0–53)
AST: 17 U/L (ref 0–37)
Alkaline Phosphatase: 47 U/L (ref 39–117)
BUN: 15 mg/dL (ref 6–23)
CALCIUM: 9.7 mg/dL (ref 8.4–10.5)
CHLORIDE: 103 meq/L (ref 96–112)
CO2: 29 meq/L (ref 19–32)
Creatinine, Ser: 1.03 mg/dL (ref 0.40–1.50)
GFR: 77.31 mL/min (ref >60.00)
Glucose, Bld: 134 mg/dL — ABNORMAL HIGH (ref 70–99)
POTASSIUM: 4.3 meq/L (ref 3.5–5.1)
Sodium: 141 mEq/L (ref 135–145)
Total Bilirubin: 0.7 mg/dL (ref 0.2–1.2)
Total Protein: 6.6 g/dL (ref 6.0–8.3)

## 2015-12-08 LAB — LIPID PANEL
CHOLESTEROL: 115 mg/dL (ref 0–200)
HDL: 29.4 mg/dL — AB (ref >39.00)
LDL CALC: 67 mg/dL (ref 0–99)
NonHDL: 85.89
TRIGLYCERIDES: 94 mg/dL (ref 0.0–149.0)
Total CHOL/HDL Ratio: 4
VLDL: 18.8 mg/dL (ref 0.0–40.0)

## 2015-12-08 LAB — HEMOGLOBIN A1C: HEMOGLOBIN A1C: 6.7 % — AB (ref 4.6–6.5)

## 2015-12-11 ENCOUNTER — Other Ambulatory Visit (HOSPITAL_COMMUNITY): Payer: Self-pay | Admitting: Internal Medicine

## 2015-12-11 ENCOUNTER — Other Ambulatory Visit: Payer: Self-pay | Admitting: Family Medicine

## 2015-12-15 ENCOUNTER — Ambulatory Visit (INDEPENDENT_AMBULATORY_CARE_PROVIDER_SITE_OTHER): Payer: 59 | Admitting: Family Medicine

## 2015-12-15 ENCOUNTER — Encounter: Payer: Self-pay | Admitting: Family Medicine

## 2015-12-15 VITALS — BP 124/72 | HR 66 | Temp 97.3°F | Wt 226.5 lb

## 2015-12-15 DIAGNOSIS — E119 Type 2 diabetes mellitus without complications: Secondary | ICD-10-CM

## 2015-12-15 DIAGNOSIS — E785 Hyperlipidemia, unspecified: Secondary | ICD-10-CM

## 2015-12-15 DIAGNOSIS — E669 Obesity, unspecified: Secondary | ICD-10-CM

## 2015-12-15 NOTE — Progress Notes (Signed)
Subjective:    Patient ID: Harry Payne, male    DOB: 09/23/1951, 64 y.o.   MRN: [id number]  HPI Here for f/uof chronic health problems  Feeling fairly good for the most part - occ cp / keeps up with cardiology   Wt is down 2 lb with bmi of 29  Helping friend and that requires climbing a lot of stairs  Otherwise occ goes to the gym   BP Readings from Last 3 Encounters:  12/15/15 124/72  09/29/15 106/78  06/15/15 141/89       Diabetes Home sugar results - he checks blood sugar before he eats and usually 130s or lower  Only one low glucose of 68  DM diet - aside from the holiday- he is eating well  Exercise - fair  Symptoms-none  A1C last  Lab Results  Component Value Date   HGBA1C 6.7* 12/08/2015  very good control   No problems with medications  Renal protection- ace  Last eye exam - due for that in June  Cholesterol Lab Results  Component Value Date   CHOL 115 12/08/2015   CHOL 148 03/06/2015   CHOL 130 03/11/2014   Lab Results  Component Value Date   HDL 29.40* 12/08/2015   HDL 28.20* 03/06/2015   HDL 27.20* 03/11/2014   Lab Results  Component Value Date   LDLCALC 67 12/08/2015   LDLCALC 89 03/06/2015   LDLCALC 85 03/11/2014   Lab Results  Component Value Date   TRIG 94.0 12/08/2015   TRIG 155.0* 03/06/2015   TRIG 90.0 03/11/2014   Lab Results  Component Value Date   CHOLHDL 4 12/08/2015   CHOLHDL 5 03/06/2015   CHOLHDL 5 03/11/2014   No results found for: LDLDIRECT  HDL - up a point - cannot get that up    Patient Active Problem List   Diagnosis Date Noted  . Cardiomyopathy, ischemic 06/14/2015  . Ischemic cardiomyopathy 06/14/2015  . Erectile dysfunction 04/14/2014  . Family history of prostate cancer 03/11/2014  . Rectal bleeding 01/31/2014  . Obesity 03/25/2012  . B12 DEFICIENCY 11/07/2009  . COLONIC POLYPS, ADENOMATOUS, HX OF 04/04/2009  . Diabetes type 2, controlled ([place]) 02/10/2008  . Hyperlipidemia 02/09/2008  . ANXIETY  02/09/2008  . GERD 02/09/2008  . INSOMNIA 02/09/2008  . TOBACCO USE, QUIT 02/09/2008   Past Medical History  Diagnosis Date  . CAD (coronary artery disease)   . GERD (gastroesophageal reflux disease)   . Hx of adenomatous colonic polyps   . Hemorrhoids   . AICD (automatic cardioverter/defibrillator) present   . Hypotension   . CHF (congestive heart failure) ([place])   . Anterior myocardial infarction ([place]) 2004 X 2  . Hyperlipidemia   . Type II diabetes mellitus [facility])    Past Surgical History  Procedure Laterality Date  . Closed reduction hand fracture Left 1990  . Ep implantable device N/A 06/14/2015    Procedure: ICD Implant;  Surgeon: Deboraha Sprang, MD;  Location: [place] [facility];  Service: Cardiovascular;  Laterality: N/A;  . Fracture surgery    . Coronary angioplasty with stent placement  2004    "2"  . Cardiac catheterization  2012    "no stent"   Social History  Substance Use Topics  . Smoking status: Former Smoker -- 2.50 packs/day for 37 years    Types: Cigarettes    Quit date: 12/30/2001  . Smokeless tobacco: Never Used  . Alcohol Use: No   Family History  Problem  Relation Age of Onset  . Prostate cancer Father   . Diabetes Brother     x3  . Heart disease Brother    No Known Allergies Current Outpatient Prescriptions on File Prior to Visit  Medication Sig Dispense Refill  . acetaminophen (TYLENOL) 500 MG tablet Take 500 mg by mouth every 6 (six) hours as needed for mild pain or moderate pain.    Marland Kitchen aspirin 81 MG tablet Take 81 mg by mouth daily.     Marland Kitchen atorvastatin (LIPITOR) 80 MG tablet TAKE 1 TABLET (80 MG TOTAL) BY MOUTH DAILY. 30 tablet 3  . carvedilol (COREG) 25 MG tablet TAKE 1 TABLET (25 MG TOTAL) BY MOUTH 2 (TWO) TIMES DAILY WITH A MEAL. 60 tablet 0  . clopidogrel (PLAVIX) 75 MG tablet TAKE 1 TABLET (75 MG TOTAL) BY MOUTH DAILY. 30 tablet 6  . glipiZIDE (GLUCOTROL XL) 5 MG 24 hr tablet TAKE 1 TABLET (5 MG TOTAL) BY MOUTH DAILY WITH BREAKFAST. 30  tablet 0  . glucose blood (ONE TOUCH ULTRA TEST) test strip CHECK BLOOD SUGAR ONCE DAY AS NEEDED FOR DM E11.9 100 each 0  . metFORMIN (GLUCOPHAGE) 1000 MG tablet TAKE 1 TABLET (1,000 MG TOTAL) BY MOUTH 2 (TWO) TIMES DAILY WITH A MEAL. 60 tablet 11  . NITROSTAT 0.4 MG SL tablet PLACE 1 TABLET (0.4 MG TOTAL) UNDER THE TONGUE EVERY 5 (FIVE) MINUTES AS NEEDED. (Patient taking differently: PLACE 1 TABLET (0.4 MG TOTAL) UNDER THE TONGUE EVERY 5 (FIVE) MINUTES AS NEEDED FOR CHEST PAIN) 25 tablet 4  . Omega-3 Fatty Acids (FISH OIL) 1000 MG CAPS Take 1 capsule by mouth daily.     . ramipril (ALTACE) 5 MG capsule TAKE 1 CAPSULE (5 MG TOTAL) BY MOUTH DAILY. 90 capsule 3  . ranitidine (ZANTAC) 150 MG tablet TAKE 1 TABLET (150 MG TOTAL) BY MOUTH 2 (TWO) TIMES DAILY. 180 tablet 1  . vitamin B-12 (CYANOCOBALAMIN) 1000 MCG tablet Take 1,000 mcg by mouth daily.      . [DISCONTINUED] pantoprazole (PROTONIX) 40 MG tablet Take 1 tablet (40 mg total) by mouth daily. 30 tablet 11   No current facility-administered medications on file prior to visit.    Review of Systems Review of Systems  Constitutional: Negative for fever, appetite change, and unexpected weight change.  Eyes: Negative for pain and visual disturbance.  Respiratory: Negative for cough and shortness of breath.   Cardiovascular: Negative for cp or palpitations    Gastrointestinal: Negative for nausea, diarrhea and constipation.  Genitourinary: Negative for urgency and frequency.  Skin: Negative for pallor or rash   Neurological: Negative for weakness, light-headedness, numbness and headaches.  Hematological: Negative for adenopathy. Does not bruise/bleed easily.  Psychiatric/Behavioral: Negative for dysphoric mood. The patient is not nervous/anxious.         Objective:   Physical Exam  Constitutional: He appears well-developed and well-nourished. No distress.  obese and well appearing   HENT:  Head: Normocephalic and atraumatic.    Mouth/Throat: Oropharynx is clear and moist.  Eyes: Conjunctivae and EOM are normal. Pupils are equal, round, and reactive to light.  Neck: Normal range of motion. Neck supple. No JVD present. Carotid bruit is not present. No thyromegaly present.  Cardiovascular: Normal rate, regular rhythm, normal heart sounds and intact distal pulses.  Exam reveals no gallop.   Pulmonary/Chest: Effort normal and breath sounds normal. No respiratory distress. He has no wheezes. He has no rales.  No crackles  Abdominal: Soft. Bowel sounds are normal.  He exhibits no distension, no abdominal bruit and no mass. There is no tenderness.  Musculoskeletal: He exhibits no edema.  Lymphadenopathy:    He has no cervical adenopathy.  Neurological: He is alert. He has normal reflexes.  Skin: Skin is warm and dry. No rash noted.  Psychiatric: He has a normal mood and affect.          Assessment & Plan:   Problem List Items Addressed This Visit      Endocrine   Diabetes type 2, controlled ([place]) - Primary    Lab Results  Component Value Date   HGBA1C 6.7* 12/08/2015   This is overall stable  Rev diet/exercise Enc wt loss  No change in medicine        Other   Hyperlipidemia    Disc goals for lipids and reasons to control them Rev labs with pt Rev low sat fat diet in detail Overall stable  Continue lipitor and diet  Unable to get HDL higher-enc exercise as tolerated       Obesity    Discussed how this problem influences overall health and the risks it imposes  Reviewed plan for weight loss with lower calorie diet (via better food choices and also portion control or program like weight watchers) and exercise building up to or more than 30 minutes 5 days per week including some aerobic activity   In past motivation and chronic fatigue issues have interfered

## 2015-12-15 NOTE — Patient Instructions (Signed)
Glucose control is stable  Weight is down a few pound Cholesterol is stable Get as much exercise as you can   Follow up for annual exam in 6 months with labs prior

## 2015-12-15 NOTE — Progress Notes (Signed)
Pre visit review using our clinic review tool, if applicable. No additional management support is needed unless otherwise documented below in the visit note. 

## 2015-12-16 NOTE — Assessment & Plan Note (Signed)
Disc goals for lipids and reasons to control them Rev labs with pt Rev low sat fat diet in detail Overall stable  Continue lipitor and diet  Unable to get HDL higher-enc exercise as tolerated

## 2015-12-16 NOTE — Assessment & Plan Note (Signed)
Discussed how this problem influences overall health and the risks it imposes  Reviewed plan for weight loss with lower calorie diet (via better food choices and also portion control or program like weight watchers) and exercise building up to or more than 30 minutes 5 days per week including some aerobic activity   In past motivation and chronic fatigue issues have interfered

## 2015-12-16 NOTE — Assessment & Plan Note (Signed)
Lab Results  Component Value Date   HGBA1C 6.7* 12/08/2015   This is overall stable  Rev diet/exercise Enc wt loss  No change in medicine

## 2015-12-25 ENCOUNTER — Other Ambulatory Visit: Payer: Self-pay | Admitting: Family Medicine

## 2015-12-27 NOTE — Telephone Encounter (Signed)
Pt called to ck on status of test strips; advised sent to [facility] earlier today; pt will ck with pharmacy.

## 2016-01-02 ENCOUNTER — Ambulatory Visit (INDEPENDENT_AMBULATORY_CARE_PROVIDER_SITE_OTHER): Payer: 59 | Admitting: *Deleted

## 2016-01-02 DIAGNOSIS — I255 Ischemic cardiomyopathy: Secondary | ICD-10-CM | POA: Diagnosis not present

## 2016-01-03 NOTE — Progress Notes (Signed)
Remote ICD transmission.   

## 2016-01-05 ENCOUNTER — Encounter: Payer: Self-pay | Admitting: Cardiology

## 2016-01-05 LAB — CUP PACEART REMOTE DEVICE CHECK
Date Time Interrogation Session: 20170103062404
HighPow Impedance: 85 Ohm
Implantable Lead Location: 753860
Lead Channel Pacing Threshold Amplitude: 0.5 V
Lead Channel Setting Sensing Sensitivity: 0.3 mV
MDC IDC LEAD IMPLANT DT: 20160615
MDC IDC MSMT BATTERY REMAINING LONGEVITY: 136 mo
MDC IDC MSMT BATTERY VOLTAGE: 3.1 V
MDC IDC MSMT LEADCHNL RV IMPEDANCE VALUE: 475 Ohm
MDC IDC MSMT LEADCHNL RV IMPEDANCE VALUE: 551 Ohm
MDC IDC MSMT LEADCHNL RV PACING THRESHOLD PULSEWIDTH: 0.4 ms
MDC IDC MSMT LEADCHNL RV SENSING INTR AMPL: 31.25 mV
MDC IDC MSMT LEADCHNL RV SENSING INTR AMPL: 31.25 mV
MDC IDC SET LEADCHNL RV PACING AMPLITUDE: 2.5 V
MDC IDC SET LEADCHNL RV PACING PULSEWIDTH: 0.4 ms
MDC IDC STAT BRADY RV PERCENT PACED: 0.01 %

## 2016-01-09 ENCOUNTER — Other Ambulatory Visit: Payer: Self-pay | Admitting: Family Medicine

## 2016-01-11 ENCOUNTER — Telehealth: Payer: Self-pay | Admitting: Family Medicine

## 2016-01-11 NOTE — Telephone Encounter (Signed)
Please refill it for a year  Thanks

## 2016-01-11 NOTE — Telephone Encounter (Signed)
Pt stopped in regarding his glipiZIDE (GLUCOTROL XL) 5 MG 24 hr tablet rx. He said this happened last month as well, the rx had no refills and was only written for a qty: 30. He is scheduled to see Dr. Glori Bickers in 6 months. Pt asked to not have to go through this every month and is requesting refills allowed on the next rx sent to pharmacy. The pharmacy did give him 2 pills as he took his last one today. You can reach him at [phone].

## 2016-01-12 MED ORDER — GLIPIZIDE ER 5 MG PO TB24: ORAL_TABLET | ORAL | Status: DC

## 2016-02-23 ENCOUNTER — Other Ambulatory Visit: Payer: Self-pay | Admitting: Family Medicine

## 2016-02-25 ENCOUNTER — Observation Stay (HOSPITAL_COMMUNITY)
Admission: EM | Admit: 2016-02-25 | Discharge: 2016-02-27 | Disposition: A | Discharge status: Home / Self Care | Payer: 59 | Attending: Internal Medicine | Admitting: Internal Medicine

## 2016-02-25 ENCOUNTER — Emergency Department (HOSPITAL_COMMUNITY): Payer: 59

## 2016-02-25 ENCOUNTER — Encounter (HOSPITAL_COMMUNITY): Payer: Self-pay | Admitting: *Deleted

## 2016-02-25 DIAGNOSIS — I252 Old myocardial infarction: Secondary | ICD-10-CM | POA: Diagnosis not present

## 2016-02-25 DIAGNOSIS — I2510 Atherosclerotic heart disease of native coronary artery without angina pectoris: Secondary | ICD-10-CM

## 2016-02-25 DIAGNOSIS — I200 Unstable angina: Secondary | ICD-10-CM | POA: Diagnosis present

## 2016-02-25 DIAGNOSIS — I25110 Atherosclerotic heart disease of native coronary artery with unstable angina pectoris: Principal | ICD-10-CM | POA: Insufficient documentation

## 2016-02-25 DIAGNOSIS — I5022 Chronic systolic (congestive) heart failure: Secondary | ICD-10-CM | POA: Insufficient documentation

## 2016-02-25 DIAGNOSIS — Z955 Presence of coronary angioplasty implant and graft: Secondary | ICD-10-CM | POA: Diagnosis not present

## 2016-02-25 DIAGNOSIS — R0789 Other chest pain: Secondary | ICD-10-CM

## 2016-02-25 DIAGNOSIS — Z95810 Presence of automatic (implantable) cardiac defibrillator: Secondary | ICD-10-CM | POA: Diagnosis not present

## 2016-02-25 DIAGNOSIS — R079 Chest pain, unspecified: Secondary | ICD-10-CM | POA: Insufficient documentation

## 2016-02-25 LAB — CBC
HEMATOCRIT: 44.8 % (ref 39.0–52.0)
HEMOGLOBIN: 15.6 g/dL (ref 13.0–17.0)
MCH: 32.2 pg (ref 26.0–34.0)
MCHC: 34.8 g/dL (ref 30.0–36.0)
MCV: 92.6 fL (ref 78.0–100.0)
Platelets: 239 10*3/uL (ref 150–400)
RBC: 4.84 MIL/uL (ref 4.22–5.81)
RDW: 12.9 % (ref 11.5–15.5)
WBC: 11.7 10*3/uL — ABNORMAL HIGH (ref 4.0–10.5)

## 2016-02-25 LAB — I-STAT TROPONIN, ED: Troponin i, poc: 0 ng/mL (ref 0.00–0.08)

## 2016-02-25 MED ORDER — ASPIRIN 81 MG PO CHEW
324.0000 mg | CHEWABLE_TABLET | Freq: Once | ORAL | Status: AC
  Administered 2016-02-25: 324 mg via ORAL
  Filled 2016-02-25: qty 4

## 2016-02-25 MED ORDER — NITROGLYCERIN 2 % TD OINT
1.0000 [in_us] | TOPICAL_OINTMENT | Freq: Once | TRANSDERMAL | Status: AC
  Administered 2016-02-25: 1 [in_us] via TOPICAL
  Filled 2016-02-25: qty 1

## 2016-02-25 NOTE — ED Notes (Signed)
Pt c/o central chest pain starting 1830. Pt took 1 NTG at home no relief of pressure.

## 2016-02-25 NOTE — ED Notes (Signed)
Pt's wife reports that he has been coughing and "hacking" for the past two days.

## 2016-02-25 NOTE — ED Notes (Signed)
Pt states he has had an upper respiratory infection x 3 days. Dry cough and congestion.

## 2016-02-25 NOTE — ED Provider Notes (Signed)
CSN: [id number]     Arrival date & time 02/25/16  2303 History  By signing my name below, I, Rowan Blase, attest that this documentation has been prepared under the direction and in the presence of Merryl Hacker, MD . Electronically Signed: Rowan Blase, Scribe. 02/25/2016. 11:35 PM.   Chief Complaint  Patient presents with  . Chest Pain   The history is provided by the patient. No language interpreter was used.   HPI Comments:  CANDIDO ZUKOSKI is a 65 y.o. male with PMHx of CAD, GERD, CHF, and DM who presents to the Emergency Department complaining of sharp, 7/10, non-radiating central chest pain onset 4 hours ago. He states he was doing nothing at pain onset. Pt notes this pain feels similar to pain from a past heart attack. Pt reports he has chest pain often and his last cardiac catheterization was last year. He took 81 mg Aspirin and nitroglycerin PTA with no relief. Pt denies pain onset after eating, shortness of breath, nausea, pain exacerbation with breathing, or leg swelling.  Pt also reports non-productive cough and congestion for the past 3 days. He denies fever.  Past Medical History  Diagnosis Date  . CAD (coronary artery disease)   . GERD (gastroesophageal reflux disease)   . Hx of adenomatous colonic polyps   . Hemorrhoids   . AICD (automatic cardioverter/defibrillator) present   . Hypotension   . CHF (congestive heart failure) ([place])   . Anterior myocardial infarction ([place]) 2004 X 2  . Hyperlipidemia   . Type II diabetes mellitus [facility])    Past Surgical History  Procedure Laterality Date  . Closed reduction hand fracture Left 1990  . Ep implantable device N/A 06/14/2015    Procedure: ICD Implant;  Surgeon: Deboraha Sprang, MD;  Location: [place] [facility];  Service: Cardiovascular;  Laterality: N/A;  . Coronary angioplasty with stent placement  2004    "2"  . Cardiac catheterization  2012    "no stent"   Family History  Problem Relation Age of Onset  .  Prostate cancer Father   . Diabetes Brother     x3  . Heart disease Brother    Social History  Substance Use Topics  . Smoking status: Former Smoker -- 2.50 packs/day for 37 years    Types: Cigarettes    Quit date: 12/30/2001  . Smokeless tobacco: Never Used  . Alcohol Use: No    Review of Systems  Constitutional: Negative for fever.  HENT: Negative for congestion.   Respiratory: Negative for cough and shortness of breath.   Cardiovascular: Positive for chest pain. Negative for leg swelling.  Gastrointestinal: Negative for nausea.  All other systems reviewed and are negative.  Allergies  Review of patient's allergies indicates no known allergies.  Home Medications   Prior to Admission medications   Medication Sig Start Date End Date Taking? Authorizing Provider  acetaminophen (TYLENOL) 500 MG tablet Take 500 mg by mouth every 6 (six) hours as needed for mild pain or moderate pain.   Yes Historical Provider, MD  aspirin 81 MG tablet Take 81 mg by mouth daily.    Yes Historical Provider, MD  atorvastatin (LIPITOR) 80 MG tablet TAKE 1 TABLET (80 MG TOTAL) BY MOUTH DAILY. 12/11/15  Yes Shaune Pascal Bensimhon, MD  carvedilol (COREG) 25 MG tablet TAKE 1 TABLET (25 MG TOTAL) BY MOUTH 2 (TWO) TIMES DAILY WITH A MEAL. 01/09/16  Yes Abner Greenspan, MD  clopidogrel (PLAVIX) 75 MG tablet  TAKE 1 TABLET (75 MG TOTAL) BY MOUTH DAILY. 08/04/15  Yes Shaune Pascal Bensimhon, MD  glipiZIDE (GLUCOTROL XL) 5 MG 24 hr tablet TAKE 1 TABLET (5 MG TOTAL) BY MOUTH DAILY WITH BREAKFAST. 01/12/16  Yes Abner Greenspan, MD  metFORMIN (GLUCOPHAGE) 1000 MG tablet TAKE 1 TABLET (1,000 MG TOTAL) BY MOUTH 2 (TWO) TIMES DAILY WITH A MEAL. 03/13/15  Yes Marne A Tower, MD  NITROSTAT 0.4 MG SL tablet PLACE 1 TABLET (0.4 MG TOTAL) UNDER THE TONGUE EVERY 5 (FIVE) MINUTES AS NEEDED. Patient taking differently: PLACE 1 TABLET (0.4 MG TOTAL) UNDER THE TONGUE EVERY 5 (FIVE) MINUTES AS NEEDED FOR CHEST PAIN 11/01/14  Yes Abner Greenspan, MD   Omega-3 Fatty Acids (FISH OIL) 1000 MG CAPS Take 1 capsule by mouth daily.    Yes Historical Provider, MD  ramipril (ALTACE) 5 MG capsule TAKE 1 CAPSULE (5 MG TOTAL) BY MOUTH DAILY. 03/10/15  Yes Abner Greenspan, MD  ranitidine (ZANTAC) 150 MG tablet TAKE 1 TABLET (150 MG TOTAL) BY MOUTH 2 (TWO) TIMES DAILY. 02/23/16  Yes Abner Greenspan, MD  vitamin B-12 (CYANOCOBALAMIN) 1000 MCG tablet Take 1,000 mcg by mouth daily.     Yes Historical Provider, MD  ONE TOUCH ULTRA TEST test strip CHECK BLOOD SUGAR ONCE DAY AS NEEDED FOR DM E11.9 12/27/15   Abner Greenspan, MD   BP 123/77 mmHg  Pulse 70  Temp(Src) 98.4 F (36.9 C) (Oral)  Resp 14  Ht 5\' 11"  (1.803 m)  Wt 225 lb (102.059 kg)  BMI 31.39 kg/m2  SpO2 97% Physical Exam  Constitutional: He is oriented to person, place, and time. He appears well-developed and well-nourished. No distress.  overweight  HENT:  Head: Normocephalic and atraumatic.  Cardiovascular: Normal rate, regular rhythm and normal heart sounds.   No murmur heard. Pulmonary/Chest: Effort normal and breath sounds normal. No respiratory distress. He has no wheezes.  ICD left chest  Abdominal: Soft. Bowel sounds are normal. There is no tenderness. There is no rebound.  Musculoskeletal: He exhibits no edema.  Neurological: He is alert and oriented to person, place, and time.  Skin: Skin is warm and dry.  Psychiatric: He has a normal mood and affect.  Nursing note and vitals reviewed.   ED Course  Procedures  DIAGNOSTIC STUDIES:  Oxygen Saturation is 95% on RA, adequate by my interpretation.    COORDINATION OF CARE:  11:29 PM Will administer Aspirin and NTG. Will order lab work. Discussed treatment plan with pt at bedside and pt agreed to plan.  Labs Review Labs Reviewed  BASIC METABOLIC PANEL - Abnormal; Notable for the following:    Glucose, Bld 122 (*)    All other components within normal limits  CBC - Abnormal; Notable for the following:    WBC 11.7 (*)    All  other components within normal limits  BRAIN NATRIURETIC PEPTIDE  I-STAT TROPOININ, ED    Imaging Review Dg Chest 2 View  02/26/2016  CLINICAL DATA:  Acute onset of generalized chest pain. Initial encounter. EXAM: CHEST  2 VIEW COMPARISON:  Chest radiograph performed 06/15/2015 FINDINGS: The lungs are well-aerated. Mild left basilar atelectasis is noted. There is no evidence of focal opacification, pleural effusion or pneumothorax. The heart is borderline enlarged. An AICD is noted at the left chest wall, with a single lead ending at the right ventricle. No acute osseous abnormalities are seen. IMPRESSION: Mild left basilar atelectasis noted.  Borderline cardiomegaly. Electronically Signed   By:  Garald Balding M.D.   On: 02/26/2016 01:13   I have personally reviewed and evaluated these images and lab results as part of my medical decision-making.   EKG Interpretation   Date/Time:  Sunday February 25 2016 23:09:03 EST Ventricular Rate:  69 PR Interval:  150 QRS Duration: 84 QT Interval:  406 QTC Calculation: 435 R Axis:   69 Text Interpretation:  Normal sinus rhythm Anteroseptal infarct , age  undetermined Abnormal ECG Confirmed by HORTON  MD, COURTNEY ([id number]) on  02/25/2016 11:15:21 PM      MDM   Final diagnoses:  Other chest pain    Patient presents with chest pain. Onset 7:30 this evening. States the pain is similar to when he had his heart attack.  Also reports URI symptoms for the last 2 days. Afebrile and otherwise nontoxic-appearing. EKG without acute ischemic changes. Initial troponin negative. Chest x-ray largely unremarkable. Basic labwork is otherwise reassuring. Nitroglycerin paste was applied without significant improvement of pain. Patient's blood pressure dropped into the 0000000 systolic. Additionally, morphine was given with minimal improvement of pain. Still rates his pain at 6 out of 10. Will consult cardiology for further evaluation.  Discussed with Dr. Wynonia Lawman. He  will evaluate the patient.  I personally performed the services described in this documentation, which was scribed in my presence. The recorded information has been reviewed and is accurate.    Merryl Hacker, MD 02/26/16 [phone]

## 2016-02-26 ENCOUNTER — Encounter (HOSPITAL_COMMUNITY): Payer: Self-pay | Admitting: Cardiology

## 2016-02-26 ENCOUNTER — Observation Stay (HOSPITAL_BASED_OUTPATIENT_CLINIC_OR_DEPARTMENT_OTHER): Payer: 59

## 2016-02-26 DIAGNOSIS — R079 Chest pain, unspecified: Secondary | ICD-10-CM

## 2016-02-26 DIAGNOSIS — I5022 Chronic systolic (congestive) heart failure: Secondary | ICD-10-CM | POA: Diagnosis present

## 2016-02-26 DIAGNOSIS — Z95810 Presence of automatic (implantable) cardiac defibrillator: Secondary | ICD-10-CM | POA: Insufficient documentation

## 2016-02-26 DIAGNOSIS — I200 Unstable angina: Secondary | ICD-10-CM

## 2016-02-26 DIAGNOSIS — I2510 Atherosclerotic heart disease of native coronary artery without angina pectoris: Secondary | ICD-10-CM

## 2016-02-26 DIAGNOSIS — I252 Old myocardial infarction: Secondary | ICD-10-CM

## 2016-02-26 LAB — MRSA PCR SCREENING: MRSA by PCR: NEGATIVE

## 2016-02-26 LAB — TROPONIN I
Troponin I: <0.03 ng/mL (ref <0.031)
Troponin I: <0.03 ng/mL (ref <0.031)

## 2016-02-26 LAB — BASIC METABOLIC PANEL
Anion gap: 11 (ref 5–15)
Anion gap: 14 (ref 5–15)
BUN: 14 mg/dL (ref 6–20)
BUN: 13 mg/dL (ref 6–20)
CHLORIDE: 100 mmol/L — AB (ref 101–111)
CHLORIDE: 103 mmol/L (ref 101–111)
CO2: 23 mmol/L (ref 22–32)
CO2: 26 mmol/L (ref 22–32)
CREATININE: 0.91 mg/dL (ref 0.61–1.24)
CREATININE: 1 mg/dL (ref 0.61–1.24)
Calcium: 9.4 mg/dL (ref 8.9–10.3)
Calcium: 9.6 mg/dL (ref 8.9–10.3)
GFR calc Af Amer: >60 mL/min (ref >60)
GFR calc non Af Amer: >60 mL/min (ref >60)
Glucose, Bld: 122 mg/dL — ABNORMAL HIGH (ref 65–99)
Glucose, Bld: 155 mg/dL — ABNORMAL HIGH (ref 65–99)
Potassium: 4.4 mmol/L (ref 3.5–5.1)
Potassium: 4.5 mmol/L (ref 3.5–5.1)
SODIUM: 137 mmol/L (ref 135–145)
SODIUM: 140 mmol/L (ref 135–145)

## 2016-02-26 LAB — COMPREHENSIVE METABOLIC PANEL
ALK PHOS: 51 U/L (ref 38–126)
ALT: 29 U/L (ref 17–63)
AST: 25 U/L (ref 15–41)
Albumin: 3.7 g/dL (ref 3.5–5.0)
Anion gap: 12 (ref 5–15)
BILIRUBIN TOTAL: 0.9 mg/dL (ref 0.3–1.2)
BUN: 15 mg/dL (ref 6–20)
CALCIUM: 9.2 mg/dL (ref 8.9–10.3)
CO2: 27 mmol/L (ref 22–32)
CREATININE: 0.96 mg/dL (ref 0.61–1.24)
Chloride: 98 mmol/L — ABNORMAL LOW (ref 101–111)
Glucose, Bld: 155 mg/dL — ABNORMAL HIGH (ref 65–99)
Potassium: 3.9 mmol/L (ref 3.5–5.1)
Sodium: 137 mmol/L (ref 135–145)
TOTAL PROTEIN: 6.4 g/dL — AB (ref 6.5–8.1)

## 2016-02-26 LAB — TSH: TSH: 0.691 u[IU]/mL (ref 0.350–4.500)

## 2016-02-26 LAB — BRAIN NATRIURETIC PEPTIDE: B NATRIURETIC PEPTIDE 5: 34.7 pg/mL (ref 0.0–100.0)

## 2016-02-26 LAB — HEPARIN LEVEL (UNFRACTIONATED)
HEPARIN UNFRACTIONATED: 0.45 [IU]/mL (ref 0.30–0.70)
Heparin Unfractionated: 0.24 IU/mL — ABNORMAL LOW (ref 0.30–0.70)

## 2016-02-26 LAB — GLUCOSE, CAPILLARY
GLUCOSE-CAPILLARY: 152 mg/dL — AB (ref 65–99)
GLUCOSE-CAPILLARY: 151 mg/dL — AB (ref 65–99)

## 2016-02-26 MED ORDER — ATORVASTATIN CALCIUM 80 MG PO TABS
80.0000 mg | ORAL_TABLET | Freq: Every day | ORAL | Status: DC
  Administered 2016-02-26 – 2016-02-27 (×2): 80 mg via ORAL
  Filled 2016-02-26 (×2): qty 1

## 2016-02-26 MED ORDER — SODIUM CHLORIDE 0.9% FLUSH: 3.0000 mL | Freq: Two times a day (BID) | INTRAVENOUS | Status: DC

## 2016-02-26 MED ORDER — NITROGLYCERIN 0.4 MG SL SUBL: 0.4000 mg | SUBLINGUAL_TABLET | SUBLINGUAL | Status: DC | PRN

## 2016-02-26 MED ORDER — RAMIPRIL 5 MG PO CAPS
5.0000 mg | ORAL_CAPSULE | Freq: Every day | ORAL | Status: DC
  Administered 2016-02-26 – 2016-02-27 (×2): 5 mg via ORAL
  Filled 2016-02-26 (×2): qty 1

## 2016-02-26 MED ORDER — HEPARIN (PORCINE) IN NACL 100-0.45 UNIT/ML-% IJ SOLN
1500.0000 [IU]/h | INTRAMUSCULAR | Status: DC
  Administered 2016-02-26: 1300 [IU]/h via INTRAVENOUS
  Administered 2016-02-26: 1500 [IU]/h via INTRAVENOUS
  Filled 2016-02-26 (×3): qty 250

## 2016-02-26 MED ORDER — HEPARIN BOLUS VIA INFUSION
4000.0000 [IU] | Freq: Once | INTRAVENOUS | Status: AC
  Administered 2016-02-26: 4000 [IU] via INTRAVENOUS
  Filled 2016-02-26: qty 4000

## 2016-02-26 MED ORDER — ACETAMINOPHEN 500 MG PO TABS
500.0000 mg | ORAL_TABLET | Freq: Four times a day (QID) | ORAL | Status: DC | PRN
Start: 2016-02-26 — End: 2016-02-27
  Administered 2016-02-27: 500 mg via ORAL
  Filled 2016-02-26: qty 1

## 2016-02-26 MED ORDER — FENTANYL CITRATE (PF) 100 MCG/2ML IJ SOLN
50.0000 ug | Freq: Once | INTRAMUSCULAR | Status: AC
Start: 2016-02-26 — End: 2016-02-26
  Administered 2016-02-26: 50 ug via INTRAVENOUS
  Filled 2016-02-26: qty 2

## 2016-02-26 MED ORDER — ONDANSETRON HCL 4 MG/2ML IJ SOLN: 4.0000 mg | Freq: Four times a day (QID) | INTRAMUSCULAR | Status: DC | PRN

## 2016-02-26 MED ORDER — ASPIRIN 81 MG PO TABS: 81.0000 mg | ORAL_TABLET | Freq: Every day | ORAL | Status: DC

## 2016-02-26 MED ORDER — MORPHINE SULFATE (PF) 4 MG/ML IV SOLN: 4.0000 mg | Freq: Once | INTRAVENOUS | Status: DC

## 2016-02-26 MED ORDER — ONDANSETRON HCL 4 MG/2ML IJ SOLN
4.0000 mg | Freq: Once | INTRAMUSCULAR | Status: AC
  Administered 2016-02-26: 4 mg via INTRAVENOUS
  Filled 2016-02-26: qty 2

## 2016-02-26 MED ORDER — GLIPIZIDE ER 5 MG PO TB24: 5.0000 mg | ORAL_TABLET | Freq: Every day | ORAL | Status: DC

## 2016-02-26 MED ORDER — MORPHINE SULFATE (PF) 4 MG/ML IV SOLN
4.0000 mg | Freq: Once | INTRAVENOUS | Status: AC
  Administered 2016-02-26: 4 mg via INTRAVENOUS
  Filled 2016-02-26: qty 1

## 2016-02-26 MED ORDER — FAMOTIDINE 20 MG PO TABS
20.0000 mg | ORAL_TABLET | Freq: Every day | ORAL | Status: DC
  Administered 2016-02-26 – 2016-02-27 (×2): 20 mg via ORAL
  Filled 2016-02-26 (×2): qty 1

## 2016-02-26 MED ORDER — CARVEDILOL 25 MG PO TABS
25.0000 mg | ORAL_TABLET | Freq: Two times a day (BID) | ORAL | Status: DC
  Administered 2016-02-26 – 2016-02-27 (×3): 25 mg via ORAL
  Filled 2016-02-26 (×3): qty 1

## 2016-02-26 MED ORDER — SODIUM CHLORIDE 0.9 % WEIGHT BASED INFUSION
3.0000 mL/kg/h | INTRAVENOUS | Status: DC
  Administered 2016-02-27: 3 mL/kg/h via INTRAVENOUS

## 2016-02-26 MED ORDER — ASPIRIN EC 81 MG PO TBEC
81.0000 mg | DELAYED_RELEASE_TABLET | Freq: Every day | ORAL | Status: DC
  Administered 2016-02-26: 81 mg via ORAL
  Filled 2016-02-26: qty 1

## 2016-02-26 MED ORDER — METFORMIN HCL 500 MG PO TABS: 1000.0000 mg | ORAL_TABLET | Freq: Two times a day (BID) | ORAL | Status: DC

## 2016-02-26 MED ORDER — NITROGLYCERIN IN D5W 200-5 MCG/ML-% IV SOLN
3.0000 ug/min | INTRAVENOUS | Status: DC
  Administered 2016-02-26: 5 ug/min via INTRAVENOUS
  Filled 2016-02-26: qty 250

## 2016-02-26 MED ORDER — SODIUM CHLORIDE 0.9% FLUSH: 3.0000 mL | INTRAVENOUS | Status: DC | PRN

## 2016-02-26 MED ORDER — SODIUM CHLORIDE 0.9 % IV SOLN: 250.0000 mL | INTRAVENOUS | Status: DC | PRN

## 2016-02-26 MED ORDER — CLOPIDOGREL BISULFATE 75 MG PO TABS
75.0000 mg | ORAL_TABLET | Freq: Every day | ORAL | Status: DC
  Administered 2016-02-26 – 2016-02-27 (×2): 75 mg via ORAL
  Filled 2016-02-26 (×2): qty 1

## 2016-02-26 MED ORDER — VITAMIN B-12 1000 MCG PO TABS
1000.0000 ug | ORAL_TABLET | Freq: Every day | ORAL | Status: DC
  Administered 2016-02-26 – 2016-02-27 (×2): 1000 ug via ORAL
  Filled 2016-02-26 (×2): qty 1

## 2016-02-26 MED ORDER — SODIUM CHLORIDE 0.9 % WEIGHT BASED INFUSION: 1.0000 mL/kg/h | INTRAVENOUS | Status: DC

## 2016-02-26 NOTE — Progress Notes (Signed)
ANTICOAGULATION CONSULT NOTE - Initial Consult  Pharmacy Consult for Heparin Indication: chest pain/ACS  No Known Allergies  Patient Measurements: Height: 5\' 11"  (180.3 cm) Weight: 225 lb (102.059 kg) IBW/kg (Calculated) : 75.3 Heparin Dosing Weight: 95 kg  Vital Signs: Temp: 98.4 F (36.9 C) (02/26 2307) Temp Source: Oral (02/26 2307) BP: 123/77 mmHg (02/26 2337) Pulse Rate: 70 (02/26 2337)  Labs:  Recent Labs  02/25/16 2335  HGB 15.6  HCT 44.8  PLT 239  CREATININE 1.00    Estimated Creatinine Clearance: 90.8 mL/min (by C-G formula based on Cr of 1).   Medical History: Past Medical History  Diagnosis Date  . CAD (coronary artery disease)   . GERD (gastroesophageal reflux disease)   . Hx of adenomatous colonic polyps   . Hemorrhoids   . AICD (automatic cardioverter/defibrillator) present   . Hypotension   . CHF (congestive heart failure) ([place])   . Anterior myocardial infarction ([place]) 2004 X 2  . Hyperlipidemia   . Type II diabetes mellitus ([facility])     Medications:  See electronic med rec  Assessment: 65 y.o. M presents with CP. To begin heparin for r/o ACS. CBC ok at baseline. No AC PTA.  Goal of Therapy:  Heparin level 0.3-0.7 units/ml Monitor platelets by anticoagulation protocol: Yes   Plan:  Heparin IV bolus 4000 units Heparin gtt at 1300 units/hr Will f/u heparin level in 6 hours Daily heparin level and CBC  Sherlon Handing, PharmD, BCPS Clinical pharmacist, pager [phone] 02/26/2016,3:38 AM

## 2016-02-26 NOTE — H&P (Signed)
History and Physical   Admit date: 02/25/2016 Name:  Harry Payne Medical record number: [id number] DOB/Age:  64/22/1952  65 y.o. male  Referring Physician:   [facility]  Primary Cardiologist:  Dr. Haroldine Laws  Primary Physician:   Dr. Glori Bickers  Chief complaint/reason for admission: Chest pain  HPI:  This 65 year old male has a history of an anterior infarction in 2004 with stenting to the LAD in March with a Taxus stent and subsequently stenting of the circumflex with an express 2 stent.  He had repeat catheterization later that year and again in 2012 which showed patent stents with minimal disease.  He has been followed over the years and had a an echocardiogram that showed a drop in ejection fraction to 30-35% from a previous EF of 43% and in addition had a myocardial perfusion scan showing an EF of 32% in April of last year with an anterior scar but no ischemia.  He underwent a single-chamber defibrillator last June for primary prevention.  He is no longer working.  He normally does not have angina and denies PND or orthopnea or edema.  He does complain of anxiety and is worried about his defibrillator.  He had the onset of sharp midsternal pain that was described as 7 out of 10 tonight while watching television.  Pain was severe and he came to the emergency room where initial troponin was negative.  He was initially given morphine but stated he did not like the way it made him feel he continues to complain of chest pain that he says is relieved somewhat with sitting up.  He says the pain feels like the pain of his previous myocardial infarction.  He is admitted at this time for further evaluation of prolonged chest pain.  EKG showed a previous anterior infarction but was nonischemic.   Past Medical History  Diagnosis Date  . CAD (coronary artery disease)   . GERD (gastroesophageal reflux disease)   . Hx of adenomatous colonic polyps   . Hemorrhoids   . AICD (automatic  cardioverter/defibrillator) present   . Hypotension   . CHF (congestive heart failure) ([place])   . Anterior myocardial infarction ([place]) 2004 X 2  . Hyperlipidemia   . Type II diabetes mellitus [facility])      Past Surgical History  Procedure Laterality Date  . Closed reduction hand fracture Left 1990  . Ep implantable device N/A 06/14/2015    Procedure: ICD Implant;  Surgeon: Deboraha Sprang, MD;  Location: [facility];  Service: Cardiovascular;  Laterality: N/A;  . Coronary angioplasty with stent placement  2004    "2"  . Cardiac catheterization  2012    "no stent"   Allergies: has No Known Allergies.   Medications: Prior to Admission medications   Medication Sig Start Date End Date Taking? Authorizing Provider  acetaminophen (TYLENOL) 500 MG tablet Take 500 mg by mouth every 6 (six) hours as needed for mild pain or moderate pain.   Yes Historical Provider, MD  aspirin 81 MG tablet Take 81 mg by mouth daily.    Yes Historical Provider, MD  atorvastatin (LIPITOR) 80 MG tablet TAKE 1 TABLET (80 MG TOTAL) BY MOUTH DAILY. 12/11/15  Yes Shaune Pascal Bensimhon, MD  carvedilol (COREG) 25 MG tablet TAKE 1 TABLET (25 MG TOTAL) BY MOUTH 2 (TWO) TIMES DAILY WITH A MEAL. 01/09/16  Yes Abner Greenspan, MD  clopidogrel (PLAVIX) 75 MG tablet TAKE 1 TABLET (75 MG TOTAL) BY MOUTH DAILY.  08/04/15  Yes Shaune Pascal Bensimhon, MD  glipiZIDE (GLUCOTROL XL) 5 MG 24 hr tablet TAKE 1 TABLET (5 MG TOTAL) BY MOUTH DAILY WITH BREAKFAST. 01/12/16  Yes Abner Greenspan, MD  metFORMIN (GLUCOPHAGE) 1000 MG tablet TAKE 1 TABLET (1,000 MG TOTAL) BY MOUTH 2 (TWO) TIMES DAILY WITH A MEAL. 03/13/15  Yes Marne A Tower, MD  NITROSTAT 0.4 MG SL tablet PLACE 1 TABLET (0.4 MG TOTAL) UNDER THE TONGUE EVERY 5 (FIVE) MINUTES AS NEEDED. Patient taking differently: PLACE 1 TABLET (0.4 MG TOTAL) UNDER THE TONGUE EVERY 5 (FIVE) MINUTES AS NEEDED FOR CHEST PAIN 11/01/14  Yes Abner Greenspan, MD  Omega-3 Fatty Acids (FISH OIL) 1000 MG CAPS Take 1 capsule  by mouth daily.    Yes Historical Provider, MD  ramipril (ALTACE) 5 MG capsule TAKE 1 CAPSULE (5 MG TOTAL) BY MOUTH DAILY. 03/10/15  Yes Abner Greenspan, MD  ranitidine (ZANTAC) 150 MG tablet TAKE 1 TABLET (150 MG TOTAL) BY MOUTH 2 (TWO) TIMES DAILY. 02/23/16  Yes Abner Greenspan, MD  vitamin B-12 (CYANOCOBALAMIN) 1000 MCG tablet Take 1,000 mcg by mouth daily.     Yes Historical Provider, MD  ONE TOUCH ULTRA TEST test strip CHECK BLOOD SUGAR ONCE DAY AS NEEDED FOR DM E11.9 12/27/15   Abner Greenspan, MD   Family History:  Family Status  Relation Status Death Age  . Mother Deceased 56  . Father Deceased 9  . Sister Alive   . Brother Alive   . Brother Alive   . Brother Other   . Sister Alive   . Sister Alive   . Sister Alive   . Sister Alive   . Sister Alive   . Sister Alive     Social History:   reports that he quit smoking about 14 years ago. His smoking use included Cigarettes. He has a 92.5 pack-year smoking history. He has never used smokeless tobacco. He reports that he does not drink alcohol or use illicit drugs.   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  He is married.   Review of Systems:  Has been obese for several years.  He states that he has had a cough with nonproductive sputum but no temperature for the past 3 days.  He states that his heart rate goes up when he coughs.  He has occasional diarrhea as well as constipation.  He has urinary nocturia as well as some hesitancy.  No edema.  Other than as noted above, the remainder of the review of systems is normal  Physical Exam: BP 123/77 mmHg  Pulse 70  Temp(Src) 98.4 F (36.9 C) (Oral)  Resp 14  Ht 5\' 11"  (1.803 m)  Wt 102.059 kg (225 lb)  BMI 31.39 kg/m2  SpO2 97% General appearance: He is an obese anxious appearing male in no acute distress Head: Normocephalic, without obvious abnormality, atraumatic Eyes: conjunctivae/corneas clear. PERRL, EOM's intact. Fundi not examined  Neck: no adenopathy, no carotid bruit,  no JVD and supple, symmetrical, trachea midline Lungs: clear to auscultation bilaterally Heart: regular rate and rhythm, S1, S2 normal, no murmur, click, rub or gallop Abdomen: soft, non-tender; bowel sounds normal; no masses,  no organomegaly Rectal: deferred Extremities: extremities normal, atraumatic, no cyanosis or edema Pulses: 2+ and symmetric Skin: Skin color, texture, turgor normal. No rashes or lesions Neurologic: Grossly normal  Labs: CBC  Recent Labs  02/25/16 2335  WBC 11.7*  RBC 4.84  HGB 15.6  HCT 44.8  PLT 239  MCV 92.6  MCH 32.2  MCHC 34.8  RDW 12.9   CMP   Recent Labs  02/25/16 2335  NA 140  K 4.4  CL 103  CO2 26  GLUCOSE 122*  BUN 13  CREATININE 1.00  CALCIUM 9.6  GFRNONAA >60  GFRAA >60   BNP (last 3 results)    Component Value Date/Time   BNP 34.7 02/25/2016 2335   Cardiac Panel (last 3 results) Troponin (Point of Care Test)  Recent Labs  02/25/16 2339  TROPIPOC 0.00    Thyroid  Lab Results  Component Value Date   TSH 0.86 03/11/2014    EKG: Sinus rhythm, prior anterior infarction  Radiology: Borderline cardiomegaly, left base atelectasis   IMPRESSIONS: 1.  Prolonged sharp chest discomfort ongoing with initial negative troponin and EKG 2.  Coronary artery disease with previous anterior infarction and previous stenting of the LAD and circumflex last catheterization 2012 3.  Functioning implantable defibrillator 4.  Diabetes mellitus without complications 5.  Anxiety 6.  Type 2 diabetes without complications 7.  Hyperlipidemia under treatment 8.  Old anterior infarction 9.  Obesity  PLAN: Intravenous heparin, check repeat echo in the morning.  Nothing by mouth for further testing.  In light of chest discomfort may need to have repeat catheterization particularly since he has had a drop in his ejection fraction leading to implantable defibrillator last year.  Signed: Kerry Hough MD  [facility]  02/26/2016, 3:05 AM

## 2016-02-26 NOTE — Progress Notes (Signed)
ANTICOAGULATION CONSULT NOTE - Initial Consult  Pharmacy Consult for Heparin Indication: chest pain/ACS  No Known Allergies  Patient Measurements: Height: 5\' 11"  (180.3 cm) Weight: 225 lb (102.059 kg) IBW/kg (Calculated) : 75.3 Heparin Dosing Weight: 95 kg  Vital Signs: BP: 126/72 mmHg (02/27 1009) Pulse Rate: 64 (02/27 0915)  Labs:  Recent Labs  02/25/16 2335 02/26/16 0621 02/26/16 1218  HGB 15.6  --   --   HCT 44.8  --   --   PLT 239  --   --   HEPARINUNFRC  --   --  0.45  CREATININE 1.00 0.96  --   TROPONINI  --  <0.03  --     Estimated Creatinine Clearance: 94.6 mL/min (by C-G formula based on Cr of 0.96).   Medical History: Past Medical History  Diagnosis Date  . CAD (coronary artery disease)   . GERD (gastroesophageal reflux disease)   . Hx of adenomatous colonic polyps   . Hemorrhoids   . AICD (automatic cardioverter/defibrillator) present   . Hypotension   . CHF (congestive heart failure) ([place])   . 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 Hyperlipidemia   . Type II diabetes mellitus ([facility])     Medications:  See electronic med rec  Assessment: 65 y.o. M presents with CP. To begin heparin for r/o ACS. CBC ok at baseline. No AC PTA. Initial heparin level is therapeutic.  Goal of Therapy:  Heparin level 0.3-0.7 units/ml Monitor platelets by anticoagulation protocol: Yes   Plan:  Continue heparin gtt at 1300 units/hr Confirmatory level in 6 hours Daily heparin level and CBC F/u cardiology plans    Hughes Better, PharmD, BCPS Clinical Pharmacist Pager: [phone] 02/26/2016 1:06 PM

## 2016-02-26 NOTE — Progress Notes (Signed)
SUBJECTIVE:  Still with 2/10 chest pain.  Fatigued.     PHYSICAL EXAM Filed Vitals:   02/26/16 1230 02/26/16 1330 02/26/16 1415 02/26/16 1515  BP: 121/58 127/73 113/76 109/74  Pulse: 76 90 79   Temp:      TempSrc:      Resp: 19 22 20 20   Height:      Weight:      SpO2: 93% 93% 95% 94%   General:  No acute distress Lungs:  Clear Heart:  RRR Abdomen:  Positive bowel sounds, no rebound no guarding Extremities:  No edema  LABS: Lab Results  Component Value Date   TROPONINI <0.03 02/26/2016   Results for orders placed or performed during the hospital encounter of 02/25/16 (from the past 24 hour(s))  Basic metabolic panel     Status: Abnormal   Collection Time: 02/25/16 11:35 PM  Result Value Ref Range   Sodium 140 135 - 145 mmol/L   Potassium 4.4 3.5 - 5.1 mmol/L   Chloride 103 101 - 111 mmol/L   CO2 26 22 - 32 mmol/L   Glucose, Bld 122 (H) 65 - 99 mg/dL   BUN 13 6 - 20 mg/dL   Creatinine, Ser 1.00 0.61 - 1.24 mg/dL   Calcium 9.6 8.9 - 10.3 mg/dL   GFR calc non Af Amer >60 >60 mL/min   GFR calc Af Amer >60 >60 mL/min   Anion gap 11 5 - 15  CBC     Status: Abnormal   Collection Time: 02/25/16 11:35 PM  Result Value Ref Range   WBC 11.7 (H) 4.0 - 10.5 K/uL   RBC 4.84 4.22 - 5.81 MIL/uL   Hemoglobin 15.6 13.0 - 17.0 g/dL   HCT 44.8 39.0 - 52.0 %   MCV 92.6 78.0 - 100.0 fL   MCH 32.2 26.0 - 34.0 pg   MCHC 34.8 30.0 - 36.0 g/dL   RDW 12.9 11.5 - 15.5 %   Platelets 239 150 - 400 K/uL  Brain natriuretic peptide     Status: None   Collection Time: 02/25/16 11:35 PM  Result Value Ref Range   B Natriuretic Peptide 34.7 0.0 - 100.0 pg/mL  I-stat troponin, ED (not at [facility])     Status: None   Collection Time: 02/25/16 11:39 PM  Result Value Ref Range   Troponin i, poc 0.00 0.00 - 0.08 ng/mL   Comment 3          Comprehensive metabolic panel     Status: Abnormal   Collection Time: 02/26/16  6:21 AM  Result Value Ref Range   Sodium 137 135 - 145 mmol/L   Potassium 3.9 3.5 - 5.1 mmol/L   Chloride 98 (L) 101 - 111 mmol/L   CO2 27 22 - 32 mmol/L   Glucose, Bld 155 (H) 65 - 99 mg/dL   BUN 15 6 - 20 mg/dL   Creatinine, Ser 0.96 0.61 - 1.24 mg/dL   Calcium 9.2 8.9 - 10.3 mg/dL   Total Protein 6.4 (L) 6.5 - 8.1 g/dL   Albumin 3.7 3.5 - 5.0 g/dL   AST 25 15 - 41 U/L   ALT 29 17 - 63 U/L   Alkaline Phosphatase 51 38 - 126 U/L   Total Bilirubin 0.9 0.3 - 1.2 mg/dL   GFR calc non Af Amer >60 >60 mL/min   GFR calc Af Amer >60 >60 mL/min   Anion gap 12 5 - 15  Troponin I-(serum)  Status: None   Collection Time: 02/26/16  6:21 AM  Result Value Ref Range   Troponin I <0.03 <0.031 ng/mL  TSH     Status: None   Collection Time: 02/26/16  6:44 AM  Result Value Ref Range   TSH 0.691 0.350 - 4.500 uIU/mL  Heparin level (unfractionated)     Status: None   Collection Time: 02/26/16 12:18 PM  Result Value Ref Range   Heparin Unfractionated 0.45 0.30 - 0.70 IU/mL  Troponin I-(serum)     Status: None   Collection Time: 02/26/16 12:18 PM  Result Value Ref Range   Troponin I <0.03 <0.031 ng/mL   No intake or output data in the 24 hours ending 02/26/16 1529    ASSESSMENT AND PLAN:  CHEST PAIN:  Seen earlier this morning.  Still with some chest pain.  Plan for cardiac cath.  Scheduled for 12 N 2/28.    Harry Payne 02/26/2016 3:29 PM

## 2016-02-26 NOTE — Progress Notes (Signed)
  Echocardiogram 2D Echocardiogram has been performed.  Harry Payne 02/26/2016, 11:00 AM

## 2016-02-26 NOTE — Progress Notes (Signed)
ANTICOAGULATION CONSULT NOTE - Initial Consult  Pharmacy Consult for Heparin Indication: chest pain/ACS  No Known Allergies  Patient Measurements: Height: 5\' 11"  (180.3 cm) Weight: 283 lb 4.8 oz (128.504 kg) IBW/kg (Calculated) : 75.3 Heparin Dosing Weight: 95 kg  Vital Signs: Temp: 98.3 F (36.8 C) (02/27 1639) Temp Source: Oral (02/27 1639) BP: 124/68 mmHg (02/27 1639) Pulse Rate: 103 (02/27 1600)  Labs:  Recent Labs  02/25/16 2335 02/26/16 0621 02/26/16 1218 02/26/16 1645  HGB 15.6  --   --   --   HCT 44.8  --   --   --   PLT 239  --   --   --   HEPARINUNFRC  --   --  0.45 0.24*  CREATININE 1.00 0.96  --  0.91  TROPONINI  --  <0.03 <0.03 <0.03    Estimated Creatinine Clearance: 112.1 mL/min (by C-G formula based on Cr of 0.91).   Assessment: 65 y.o. M presents with CP on IV heparin. Plan for cath 2/28 at noon. Heparin level 0.24 on 1300 units/hr.  Goal of Therapy:  Heparin level 0.3-0.7 units/ml Monitor platelets by anticoagulation protocol: Yes   Plan:  Increase heparin gtt to 1500 units/hr Recheck level at 0100 Daily heparin level and CBC F/u cardiology plans  Maryanna Shape, PharmD, BCPS  Clinical Pharmacist  Pager: [phone]  02/26/2016 6:22 PM

## 2016-02-26 NOTE — ED Notes (Signed)
Olivia Mackie called from [employer] to report that the report states that since Jan 7th, the batteries and lead are good and there have been no episodes since Jan 7th.

## 2016-02-27 ENCOUNTER — Encounter (HOSPITAL_COMMUNITY)
Admission: EM | Disposition: A | Discharge status: Home / Self Care | Payer: Self-pay | Source: Home / Self Care | Attending: Emergency Medicine

## 2016-02-27 ENCOUNTER — Encounter (HOSPITAL_COMMUNITY): Payer: Self-pay | Admitting: Cardiology

## 2016-02-27 DIAGNOSIS — I2510 Atherosclerotic heart disease of native coronary artery without angina pectoris: Secondary | ICD-10-CM

## 2016-02-27 DIAGNOSIS — I252 Old myocardial infarction: Secondary | ICD-10-CM | POA: Diagnosis not present

## 2016-02-27 DIAGNOSIS — I25110 Atherosclerotic heart disease of native coronary artery with unstable angina pectoris: Secondary | ICD-10-CM | POA: Diagnosis not present

## 2016-02-27 DIAGNOSIS — R079 Chest pain, unspecified: Secondary | ICD-10-CM | POA: Insufficient documentation

## 2016-02-27 DIAGNOSIS — Z95810 Presence of automatic (implantable) cardiac defibrillator: Secondary | ICD-10-CM | POA: Diagnosis not present

## 2016-02-27 DIAGNOSIS — I5022 Chronic systolic (congestive) heart failure: Secondary | ICD-10-CM | POA: Diagnosis not present

## 2016-02-27 HISTORY — PX: CARDIAC CATHETERIZATION: SHX172

## 2016-02-27 LAB — CBC
HEMATOCRIT: 40.6 % (ref 39.0–52.0)
Hemoglobin: 14.1 g/dL (ref 13.0–17.0)
MCH: 32.1 pg (ref 26.0–34.0)
MCHC: 34.7 g/dL (ref 30.0–36.0)
MCV: 92.5 fL (ref 78.0–100.0)
PLATELETS: 207 10*3/uL (ref 150–400)
RBC: 4.39 MIL/uL (ref 4.22–5.81)
RDW: 13.1 % (ref 11.5–15.5)
WBC: 9.8 10*3/uL (ref 4.0–10.5)

## 2016-02-27 LAB — PROTIME-INR
INR: 1.17 (ref 0.00–1.49)
Prothrombin Time: 15 seconds (ref 11.6–15.2)

## 2016-02-27 LAB — GLUCOSE, CAPILLARY
GLUCOSE-CAPILLARY: 134 mg/dL — AB (ref 65–99)
Glucose-Capillary: 156 mg/dL — ABNORMAL HIGH (ref 65–99)
Glucose-Capillary: 118 mg/dL — ABNORMAL HIGH (ref 65–99)

## 2016-02-27 LAB — HEPARIN LEVEL (UNFRACTIONATED): Heparin Unfractionated: 0.48 IU/mL (ref 0.30–0.70)

## 2016-02-27 SURGERY — LEFT HEART CATH AND CORONARY ANGIOGRAPHY

## 2016-02-27 MED ORDER — HEPARIN (PORCINE) IN NACL 2-0.9 UNIT/ML-% IJ SOLN
INTRAMUSCULAR | Status: DC | PRN
  Administered 2016-02-27: 1000 mL

## 2016-02-27 MED ORDER — HEPARIN SODIUM (PORCINE) 1000 UNIT/ML IJ SOLN
INTRAMUSCULAR | Status: AC
  Filled 2016-02-27: qty 1

## 2016-02-27 MED ORDER — HEPARIN (PORCINE) IN NACL 2-0.9 UNIT/ML-% IJ SOLN
INTRAMUSCULAR | Status: AC
  Filled 2016-02-27: qty 500

## 2016-02-27 MED ORDER — FENTANYL CITRATE (PF) 100 MCG/2ML IJ SOLN
INTRAMUSCULAR | Status: AC
  Filled 2016-02-27: qty 2

## 2016-02-27 MED ORDER — FENTANYL CITRATE (PF) 100 MCG/2ML IJ SOLN
INTRAMUSCULAR | Status: DC | PRN
  Administered 2016-02-27: 25 ug via INTRAVENOUS

## 2016-02-27 MED ORDER — METFORMIN HCL 1000 MG PO TABS: ORAL_TABLET | ORAL | Status: DC

## 2016-02-27 MED ORDER — SODIUM CHLORIDE 0.9% FLUSH: 3.0000 mL | INTRAVENOUS | Status: DC | PRN

## 2016-02-27 MED ORDER — LIDOCAINE HCL (PF) 1 % IJ SOLN
INTRAMUSCULAR | Status: AC
  Filled 2016-02-27: qty 30

## 2016-02-27 MED ORDER — HEPARIN SODIUM (PORCINE) 1000 UNIT/ML IJ SOLN
INTRAMUSCULAR | Status: DC | PRN
  Administered 2016-02-27: 6000 [IU] via INTRAVENOUS

## 2016-02-27 MED ORDER — VERAPAMIL HCL 2.5 MG/ML IV SOLN
INTRAVENOUS | Status: DC | PRN
  Administered 2016-02-27: 10 mL via INTRA_ARTERIAL

## 2016-02-27 MED ORDER — MIDAZOLAM HCL 2 MG/2ML IJ SOLN
INTRAMUSCULAR | Status: DC | PRN
  Administered 2016-02-27: 2 mg via INTRAVENOUS

## 2016-02-27 MED ORDER — IOHEXOL 350 MG/ML SOLN
INTRAVENOUS | Status: DC | PRN
  Administered 2016-02-27: 65 mL via INTRA_ARTERIAL

## 2016-02-27 MED ORDER — MIDAZOLAM HCL 2 MG/2ML IJ SOLN
INTRAMUSCULAR | Status: AC
Start: 2016-02-27 — End: 2016-02-27
  Filled 2016-02-27: qty 2

## 2016-02-27 MED ORDER — SODIUM CHLORIDE 0.9 % IV SOLN: 250.0000 mL | INTRAVENOUS | Status: DC | PRN

## 2016-02-27 MED ORDER — HEPARIN (PORCINE) IN NACL 2-0.9 UNIT/ML-% IJ SOLN
INTRAMUSCULAR | Status: AC
Start: 2016-02-27 — End: 2016-02-27
  Filled 2016-02-27: qty 500

## 2016-02-27 MED ORDER — NITROGLYCERIN IN D5W 200-5 MCG/ML-% IV SOLN
INTRAVENOUS | Status: DC | PRN
  Administered 2016-02-27: 5 ug/min via INTRAVENOUS

## 2016-02-27 MED ORDER — SODIUM CHLORIDE 0.9 % WEIGHT BASED INFUSION: 3.0000 mL/kg/h | INTRAVENOUS | Status: AC

## 2016-02-27 MED ORDER — VERAPAMIL HCL 2.5 MG/ML IV SOLN
INTRAVENOUS | Status: AC
  Filled 2016-02-27: qty 2

## 2016-02-27 MED ORDER — SODIUM CHLORIDE 0.9% FLUSH: 3.0000 mL | Freq: Two times a day (BID) | INTRAVENOUS | Status: DC

## 2016-02-27 SURGICAL SUPPLY — 12 items
CATH INFINITI 5 FR JL3.5 (CATHETERS) ×2 IMPLANT
CATH INFINITI 5FR ANG PIGTAIL (CATHETERS) ×2 IMPLANT
CATH INFINITI JR4 5F (CATHETERS) ×2 IMPLANT
DEVICE RAD COMP TR BAND LRG (VASCULAR PRODUCTS) ×3 IMPLANT
GLIDESHEATH SLEND SS 6F .021 (SHEATH) ×2 IMPLANT
KIT HEART LEFT (KITS) ×3 IMPLANT
PACK CARDIAC CATHETERIZATION (CUSTOM PROCEDURE TRAY) ×3 IMPLANT
SYR MEDRAD MARK V 150ML (SYRINGE) ×3 IMPLANT
TRANSDUCER W/STOPCOCK (MISCELLANEOUS) ×3 IMPLANT
TUBING CIL FLEX 10 FLL-RA (TUBING) ×3 IMPLANT
TUBING CONTRAST HIGH PRESS 20 (MISCELLANEOUS) ×2 IMPLANT
WIRE SAFE-T 1.5MM-J .035X260CM (WIRE) ×2 IMPLANT

## 2016-02-27 NOTE — Progress Notes (Signed)
ANTICOAGULATION CONSULT NOTE - Follow Up Consult  Pharmacy Consult for heparin Indication: chest pain/ACS  Labs:  Recent Labs  02/25/16 2335 02/26/16 0621 02/26/16 1218 02/26/16 1645 02/27/16 0049  HGB 15.6  --   --   --   --   HCT 44.8  --   --   --   --   PLT 239  --   --   --   --   HEPARINUNFRC  --   --  0.45 0.24* 0.48  CREATININE 1.00 0.96  --  0.91  --   TROPONINI  --  <0.03 <0.03 <0.03  --      Assessment/Plan:  65yo male therapeutic on heparin after rate change. Will continue gtt at current rate and confirm stable with additional level.   Wynona Neat, PharmD, BCPS  02/27/2016,1:48 AM

## 2016-02-27 NOTE — Interval H&P Note (Signed)
History and Physical Interval Note:  02/27/2016 9:36 AM  Harry Payne  has presented today for surgery, with the diagnosis of cp  The various methods of treatment have been discussed with the patient and family. After consideration of risks, benefits and other options for treatment, the patient has consented to  Procedure(s): Left Heart Cath and Coronary Angiography (N/A) as a surgical intervention .  The patient's history has been reviewed, patient examined, no change in status, stable for surgery.  I have reviewed the patient's chart and labs.  Questions were answered to the patient's satisfaction.   Cath Lab Visit (complete for each Cath Lab visit)  Clinical Evaluation Leading to the Procedure:   ACS: Yes.    Non-ACS:    Anginal Classification: CCS IV  Anti-ischemic medical therapy: Minimal Therapy (1 class of medications)  Non-Invasive Test Results: No non-invasive testing performed  Prior CABG: No previous CABG        Harry Payne [facility] 02/27/2016 9:36 AM

## 2016-02-27 NOTE — Progress Notes (Signed)
Back to room from Cath Lab with TR band intact to RT Wrist without s/s bruise, bleed, or other s/s complications at site. TR band at 12 cc air as set by cath lab. RP 2+. Denies pain, numbness or tingling. Pt instructed to not use RUE and to keep elevated on pillow. Instructed to call prn needs or bleed. Verbalized understanding. No questions or concerns expressed at this time.

## 2016-02-27 NOTE — Discharge Summary (Signed)
Discharge Summary    Patient ID: Harry Payne,  MRN: [id number], DOB/AGE: 04-07-1951 65 y.o.  Admit date: 02/25/2016 Discharge date: 02/27/2016  Primary Care Provider: [facility] Primary Cardiologist: Bensimhon  Discharge Diagnoses    Active Problems:   CAD (coronary artery disease), native coronary artery   Chronic systolic CHF (congestive heart failure) ([place])   Unstable angina ([place])   Old anterior myocardial infarction   Chest pain   Allergies No Known Allergies  Diagnostic Studies/Procedures    Conclusion     Prox Cx lesion, 35% stenosed.  There is moderate left ventricular systolic dysfunction.  1. Nonobstructive CAD 2. Moderate LV dysfunction 3. No significant change since 2012.   Plan: medical therapy.   Diagnostic Diagram         Echocardiogram Study Conclusions  - Left ventricle: The cavity size was mildly dilated. Systolic function was moderately reduced. The estimated ejection fraction was in the range of 35% to 40%. Dyskinesis and scarring of the mid-apicalanteroseptal and apical myocardium. Doppler parameters are consistent with abnormal left ventricular relaxation (grade 1 diastolic dysfunction). Cannot exclude thrombus. - Left atrium: The atrium was mildly dilated.   CHEST 2 VIEW  COMPARISON: Chest radiograph performed 06/15/2015  FINDINGS: The lungs are well-aerated. Mild left basilar atelectasis is noted. There is no evidence of focal opacification, pleural effusion or pneumothorax.  The heart is borderline enlarged. An AICD is noted at the left chest wall, with a single lead ending at the right ventricle. No acute osseous abnormalities are seen.  IMPRESSION: Mild left basilar atelectasis noted. Borderline cardiomegaly. _____________   History of Present Illness      65 year old male has a history of an anterior infarction in 2004 with stenting to the LAD in March with a Taxus stent and subsequently  stenting of the circumflex with an express 2 stent. He had repeat catheterization later that year and again in 2012 which showed patent stents with minimal disease. He has been followed over the years and had an echocardiogram that showed a drop in ejection fraction to 30-35% from a previous EF of 43% and, in addition, had a myocardial perfusion scan showing an EF of 32% in April of last year with an anterior scar but no ischemia. He underwent a single-chamber defibrillator last June for primary prevention. He is no longer working. He normally does not have angina and denies PND or orthopnea or edema. He does complain of anxiety and is worried about his defibrillator. He had the onset of sharp midsternal pain that was described as 7 out of 10 tonight while watching television. Pain was severe and he came to the emergency room where initial troponin was negative. He was initially given morphine but stated he did not like the way it made him feel he continues to complain of chest pain that he says is relieved somewhat with sitting up. He says the pain feels like the pain of his previous myocardial infarction.   Hospital Course     Consultants: None   He was admitted for further evaluation of prolonged chest pain. EKG showed a previous anterior infarction but was nonischemic.  Mr. Grissett ruled out for MI but did undergo left heart catheterization which revealed proximal circumflex lesion of 35% before the previously placed stent which was patent. He also has a patent stent in the proximal LAD.  Echocardiogram revealing ejection fraction of 35-40% with dyskinesis and scarring of the mid apical anteroseptal and apical myocardium. He has grade  1 diastolic dysfunction.  Left atrium was mildly dilated.  No post acth complications.  The patient was seen by Dr. Percival Spanish who felt he was stable for DC home.   _____________  Discharge Vitals Blood pressure 103/60, pulse 69, temperature 98.4 F (36.9 C),  temperature source Oral, resp. rate 9, height 5\' 11"  (1.803 m), weight 222 lb (100.699 kg), SpO2 93 %.  Filed Weights   02/25/16 2340 02/26/16 1639 02/27/16 0500  Weight: 225 lb (102.059 kg) 283 lb 4.8 oz (128.504 kg) 222 lb (100.699 kg)   Well nourished, well developed, in no acute distress HEENT: Pupils are equal round react to light accommodation extraocular movements are intact.  Neck: no JVDNo cervical lymphadenopathy. Cardiac: Regular rate and rhythm without murmurs rubs or gallops. Lungs:  clear to auscultation bilaterally, no wheezing, rhonchi or rales Ext: no lower extremity edema.  2+ left radial and dorsalis pedis pulses.  TR band still in place on right wrist.  Skin: warm and dry Neuro:  Grossly normal   Labs & Radiologic Studies     CBC  Recent Labs  02/25/16 2335 02/27/16 0640  WBC 11.7* 9.8  HGB 15.6 14.1  HCT 44.8 40.6  MCV 92.6 92.5  PLT 239 [id number]   Basic Metabolic Panel  Recent Labs  02/26/16 0621 02/26/16 1645  NA 137 137  K 3.9 4.5  CL 98* 100*  CO2 27 23  GLUCOSE 155* 155*  BUN 15 14  CREATININE 0.96 0.91  CALCIUM 9.2 9.4   Liver Function Tests  Recent Labs  02/26/16 0621  AST 25  ALT 29  ALKPHOS 51  BILITOT 0.9  PROT 6.4*  ALBUMIN 3.7   No results for input(s): LIPASE, AMYLASE in the last 72 hours. Cardiac Enzymes  Recent Labs  02/26/16 0621 02/26/16 1218 02/26/16 1645  TROPONINI <0.03 <0.03 <0.03    Recent Labs  02/26/16 0644  TSH 0.691    Dg Chest 2 View  02/26/2016  CLINICAL DATA:  Acute onset of generalized chest pain. Initial encounter. EXAM: CHEST  2 VIEW COMPARISON:  Chest radiograph performed 06/15/2015 FINDINGS: The lungs are well-aerated. Mild left basilar atelectasis is noted. There is no evidence of focal opacification, pleural effusion or pneumothorax. The heart is borderline enlarged. An AICD is noted at the left chest wall, with a single lead ending at the right ventricle. No acute osseous abnormalities are  seen. IMPRESSION: Mild left basilar atelectasis noted.  Borderline cardiomegaly. Electronically Signed   By: Garald Balding M.D.   On: 02/26/2016 01:13    Disposition   Pt is being discharged home today in good condition.  Follow-up Plans & Appointments       Discharge Medications   Current Discharge Medication List    CONTINUE these medications which have NOT CHANGED   Details  acetaminophen (TYLENOL) 500 MG tablet Take 500 mg by mouth every 6 (six) hours as needed for mild pain or moderate pain.    aspirin 81 MG tablet Take 81 mg by mouth daily.     atorvastatin (LIPITOR) 80 MG tablet TAKE 1 TABLET (80 MG TOTAL) BY MOUTH DAILY. Qty: 30 tablet, Refills: 3    carvedilol (COREG) 25 MG tablet TAKE 1 TABLET (25 MG TOTAL) BY MOUTH 2 (TWO) TIMES DAILY WITH A MEAL. Qty: 60 tablet, Refills: 5    clopidogrel (PLAVIX) 75 MG tablet TAKE 1 TABLET (75 MG TOTAL) BY MOUTH DAILY. Qty: 30 tablet, Refills: 6    glipiZIDE (GLUCOTROL XL) 5 MG 24  hr tablet TAKE 1 TABLET (5 MG TOTAL) BY MOUTH DAILY WITH BREAKFAST. Qty: 90 tablet, Refills: 3    metFORMIN (GLUCOPHAGE) 1000 MG tablet TAKE 1 TABLET (1,000 MG TOTAL) BY MOUTH 2 (TWO) TIMES DAILY WITH A MEAL. Qty: 60 tablet, Refills: 11    NITROSTAT 0.4 MG SL tablet PLACE 1 TABLET (0.4 MG TOTAL) UNDER THE TONGUE EVERY 5 (FIVE) MINUTES AS NEEDED. Qty: 25 tablet, Refills: 4    Omega-3 Fatty Acids (FISH OIL) 1000 MG CAPS Take 1 capsule by mouth daily.     ramipril (ALTACE) 5 MG capsule TAKE 1 CAPSULE (5 MG TOTAL) BY MOUTH DAILY. Qty: 90 capsule, Refills: 3    ranitidine (ZANTAC) 150 MG tablet TAKE 1 TABLET (150 MG TOTAL) BY MOUTH 2 (TWO) TIMES DAILY. Qty: 180 tablet, Refills: 1    vitamin B-12 (CYANOCOBALAMIN) 1000 MCG tablet Take 1,000 mcg by mouth daily.      ONE TOUCH ULTRA TEST test strip CHECK BLOOD SUGAR ONCE DAY AS NEEDED FOR DM E11.9 Qty: 100 each, Refills: 1         Outstanding Labs/Studies     Duration of Discharge Encounter     Greater than 30 minutes including physician time.  Signed, HAGER, [place] PAC 02/27/2016, 1:22 PM  History and all data above reviewed.  Patient examined.  I agree with the findings as above.   No further chest pain.  Results as above.  No SOB.  The patient exam reveals COR:RRR  ,  Lungs: Clear  ,  Abd: Positive bowel sounds, no rebound no guarding, Ext No edema, right wrist with mild oozing and pressure band in place  .  All available labs, radiology testing, previous records reviewed. Agree with documented assessment and plan. CAD:  No obstructive residual disease.  I questioned the use of both Plavix as ASA.  He has had this conversation with Dr. Haroldine Laws and will discuss this further with him.    Daly Juni Glaab  2:16 PM  02/27/2016

## 2016-02-28 ENCOUNTER — Telehealth: Payer: Self-pay | Admitting: *Deleted

## 2016-02-28 MED FILL — Lidocaine HCl Local Preservative Free (PF) Inj 1%: INTRAMUSCULAR | Qty: 30 | Status: AC

## 2016-02-28 NOTE — Telephone Encounter (Signed)
Transition Care Management Follow-up Telephone Call   Date discharged? 02/27/16   How have you been since you were released from the hospital? Doing well, no more chest pain   Do you understand why you were in the hospital? yes   Do you understand the discharge instructions? yes   Where were you discharged to? home   Items Reviewed:  Medications reviewed: yes  Allergies reviewed: yes  Dietary changes reviewed: no  Referrals reviewed: no   Functional Questionnaire:   Activities of Daily Living (ADLs):   He states they are independent in the following: ambulation, bathing and hygiene, feeding, continence, grooming, toileting and dressing States they require assistance with the following: none   Any transportation issues/concerns?: no   Any patient concerns? no   Confirmed importance and date/time of follow-up visits scheduled yes, 03/04/16 @ 0900  Provider Appointment booked with Loura Pardon, MD  Confirmed with patient if condition begins to worsen call PCP or go to the ER.  Patient was given the office number and encouraged to call back with question or concerns.  : yes

## 2016-02-29 NOTE — Telephone Encounter (Signed)
I will see him then, thanks

## 2016-03-04 ENCOUNTER — Encounter: Payer: Self-pay | Admitting: Family Medicine

## 2016-03-04 ENCOUNTER — Ambulatory Visit (INDEPENDENT_AMBULATORY_CARE_PROVIDER_SITE_OTHER): Payer: 59 | Admitting: Family Medicine

## 2016-03-04 VITALS — BP 126/68 | HR 60 | Temp 97.5°F | Ht 71.0 in | Wt 225.8 lb

## 2016-03-04 DIAGNOSIS — R0789 Other chest pain: Secondary | ICD-10-CM

## 2016-03-04 DIAGNOSIS — I2510 Atherosclerotic heart disease of native coronary artery without angina pectoris: Secondary | ICD-10-CM | POA: Diagnosis not present

## 2016-03-04 NOTE — Progress Notes (Signed)
Subjective:    Patient ID: Harry Payne, male    DOB: 1951/02/14, 65 y.o.   MRN: [id number]  HPI Here for f/u of hosp 2/26-2/28 for cp   Had episode of midsternal cp while sitting on day of admit 7/10 - and went in defib dif not go off   Has hx of CAD with MI in the past  L sided cath showed prox cx lesion [id number] LV diastolic dysfunction - overall no change per report   R/o for MI by troponins   Lab Results  Component Value Date   WBC 9.8 02/27/2016   HGB 14.1 02/27/2016   HCT 40.6 02/27/2016   MCV 92.5 02/27/2016   PLT 207 02/27/2016     Chemistry      Component Value Date/Time   NA 137 02/26/2016 1645   K 4.5 02/26/2016 1645   CL 100* 02/26/2016 1645   CO2 23 02/26/2016 1645   BUN 14 02/26/2016 1645   CREATININE 0.91 02/26/2016 1645      Component Value Date/Time   CALCIUM 9.4 02/26/2016 1645   ALKPHOS 51 02/26/2016 0621   AST 25 02/26/2016 0621   ALT 29 02/26/2016 0621   BILITOT 0.9 02/26/2016 0621     Lab Results  Component Value Date   CHOL 115 12/08/2015   HDL 29.40* 12/08/2015   LDLCALC 67 12/08/2015   TRIG 94.0 12/08/2015   CHOLHDL 4 12/08/2015   Lab Results  Component Value Date   HGBA1C 6.7* 12/08/2015   Lab Results  Component Value Date   CKTOTAL 111 02/17/2011   CKMB 1.8 02/17/2011   TROPONINI <0.03 02/26/2016     Dg Chest 2 View  02/26/2016  CLINICAL DATA:  Acute onset of generalized chest pain. Initial encounter. EXAM: CHEST  2 VIEW COMPARISON:  Chest radiograph performed 06/15/2015 FINDINGS: The lungs are well-aerated. Mild left basilar atelectasis is noted. There is no evidence of focal opacification, pleural effusion or pneumothorax. The heart is borderline enlarged. An AICD is noted at the left chest wall, with a single lead ending at the right ventricle. No acute osseous abnormalities are seen. IMPRESSION: Mild left basilar atelectasis noted.  Borderline cardiomegaly. Electronically Signed   By: Garald Balding M.D.   On: 02/26/2016  01:13    He has had chest pains on and off since his diagnosis  Still on plavix and the aspirin  He had disc the plavix with Dr Sung Amabile at his last visit - he kept him on it even though it is not proven to do much at this stage Has f/u with cardiology in April  Wants to get back to exercise  Has not been exercising  Feels ready to do more  Diet is pretty good overall - has cut his meals in 1/2  Wt is up 3 lb with bmi of 31   Patient Active Problem List   Diagnosis Date Noted  . Chest pain   . Chronic systolic CHF (congestive heart failure) ([place]) 02/26/2016  . Unstable angina ([place]) 02/26/2016  . Old anterior myocardial infarction 02/26/2016  . CAD (coronary artery disease), native coronary artery   . AICD (automatic cardioverter/defibrillator) present   . Ischemic cardiomyopathy 06/14/2015  . Obesity 03/25/2012  . Non-insulin dependent type 2 diabetes mellitus ([place])   . Hyperlipidemia 02/09/2008  . GERD 02/09/2008   Past Medical History  Diagnosis Date  . CAD (coronary artery disease)   . GERD (gastroesophageal reflux disease)   . Hx of adenomatous colonic  polyps   . Hemorrhoids   . AICD (automatic cardioverter/defibrillator) present   . Hypotension   . CHF (congestive heart failure) ([place])   . Anterior myocardial infarction ([place]) 2004 X 2  . Hyperlipidemia   . Type II diabetes mellitus [facility])    Past Surgical History  Procedure Laterality Date  . Closed reduction hand fracture Left 1990  . Ep implantable device N/A 06/14/2015    Procedure: ICD Implant;  Surgeon: Deboraha Sprang, MD;  Location: [place] [facility];  Service: Cardiovascular;  Laterality: N/A;  . Coronary angioplasty with stent placement  2004    "2"  . Cardiac catheterization  2012    "no stent"  . Cardiac catheterization N/A 02/27/2016    Procedure: Left Heart Cath and Coronary Angiography;  Surgeon: Peter M Martinique, MD;  Location: [place] [facility];  Service: Cardiovascular;  Laterality: N/A;   Social  History  Substance Use Topics  . Smoking status: Former Smoker -- 2.50 packs/day for 37 years    Types: Cigarettes    Quit date: 12/30/2001  . Smokeless tobacco: Never Used  . Alcohol Use: No   Family History  Problem Relation Age of Onset  . Prostate cancer Father   . Diabetes Brother     x3  . Heart disease Brother    No Known Allergies Current Outpatient Prescriptions on File Prior to Visit  Medication Sig Dispense Refill  . acetaminophen (TYLENOL) 500 MG tablet Take 500 mg by mouth every 6 (six) hours as needed for mild pain or moderate pain.    Marland Kitchen aspirin 81 MG tablet Take 81 mg by mouth daily.     Marland Kitchen atorvastatin (LIPITOR) 80 MG tablet TAKE 1 TABLET (80 MG TOTAL) BY MOUTH DAILY. 30 tablet 3  . carvedilol (COREG) 25 MG tablet TAKE 1 TABLET (25 MG TOTAL) BY MOUTH 2 (TWO) TIMES DAILY WITH A MEAL. 60 tablet 5  . clopidogrel (PLAVIX) 75 MG tablet TAKE 1 TABLET (75 MG TOTAL) BY MOUTH DAILY. 30 tablet 6  . glipiZIDE (GLUCOTROL XL) 5 MG 24 hr tablet TAKE 1 TABLET (5 MG TOTAL) BY MOUTH DAILY WITH BREAKFAST. 90 tablet 3  . metFORMIN (GLUCOPHAGE) 1000 MG tablet TAKE 1 TABLET (1,000 MG TOTAL) BY MOUTH 2 (TWO) TIMES DAILY WITH A MEAL. 60 tablet 11  . NITROSTAT 0.4 MG SL tablet PLACE 1 TABLET (0.4 MG TOTAL) UNDER THE TONGUE EVERY 5 (FIVE) MINUTES AS NEEDED. (Patient taking differently: PLACE 1 TABLET (0.4 MG TOTAL) UNDER THE TONGUE EVERY 5 (FIVE) MINUTES AS NEEDED FOR CHEST PAIN) 25 tablet 4  . Omega-3 Fatty Acids (FISH OIL) 1000 MG CAPS Take 1 capsule by mouth daily.     . ONE TOUCH ULTRA TEST test strip CHECK BLOOD SUGAR ONCE DAY AS NEEDED FOR DM E11.9 100 each 1  . ramipril (ALTACE) 5 MG capsule TAKE 1 CAPSULE (5 MG TOTAL) BY MOUTH DAILY. 90 capsule 3  . ranitidine (ZANTAC) 150 MG tablet TAKE 1 TABLET (150 MG TOTAL) BY MOUTH 2 (TWO) TIMES DAILY. 180 tablet 1  . vitamin B-12 (CYANOCOBALAMIN) 1000 MCG tablet Take 1,000 mcg by mouth daily.      . [DISCONTINUED] pantoprazole (PROTONIX) 40 MG  tablet Take 1 tablet (40 mg total) by mouth daily. 30 tablet 11   No current facility-administered medications on file prior to visit.     Review of Systems Review of Systems  Constitutional: Negative for fever, appetite change, fatigue and unexpected weight change.  Eyes: Negative for  pain and visual disturbance.  Respiratory: Negative for cough and shortness of breath.   Cardiovascular: Negative for cp or palpitations    Gastrointestinal: Negative for nausea, diarrhea and constipation.  Genitourinary: Negative for urgency and frequency.  Skin: Negative for pallor or rash   Neurological: Negative for weakness, light-headedness, numbness and headaches.  Hematological: Negative for adenopathy. Does not bruise/bleed easily.  Psychiatric/Behavioral: Negative for dysphoric mood. The patient is not nervous/anxious.         Objective:   Physical Exam  Constitutional: He appears well-developed and well-nourished. No distress.  obese and well appearing   HENT:  Head: Normocephalic and atraumatic.  Mouth/Throat: Oropharynx is clear and moist.  Eyes: Conjunctivae and EOM are normal. Pupils are equal, round, and reactive to light.  Neck: Normal range of motion. Neck supple. No JVD present. Carotid bruit is not present. No thyromegaly present.  Cardiovascular: Normal rate, regular rhythm, normal heart sounds and intact distal pulses.  Exam reveals no gallop.   Pulmonary/Chest: Effort normal and breath sounds normal. No respiratory distress. He has no wheezes. He has no rales.  No crackles  Abdominal: Soft. Bowel sounds are normal. He exhibits no distension, no abdominal bruit and no mass. There is no tenderness.  Musculoskeletal: He exhibits no edema.  Lymphadenopathy:    He has no cervical adenopathy.  Neurological: He is alert. He has normal reflexes.  Skin: Skin is warm and dry. No rash noted.  Psychiatric: He has a normal mood and affect.  Pleasant           Assessment & Plan:     Problem List Items Addressed This Visit      Other   Chest pain - Primary    Here for hospital f/u - in Feb for acute chest pain  Cardiac w/u - neg for MI and L cath/ LV fxn were fairly stable Pt feels good about this and feels better to get on track with healthy diet/exercise and weight loss  He feels better staying on plavix at this time  Will d/w his cardiologist at f/u also  No GERD or other symptoms today to cause cp   Labs/studies rev with pt in detail

## 2016-03-04 NOTE — Assessment & Plan Note (Signed)
Rev recent hospitalization Asymptomatic Feels better and ready to be more active  No CHF symptoms at this time F/u with cardiology upcoming

## 2016-03-04 NOTE — Patient Instructions (Signed)
Take care of yourself  Do get started slowly on your exercise bike to increase endurance  Eat a healthy low fat and low sugar diet  Continue current medicines  If chest pain returns please let us know and seek medical attention  I'm glad you are feeling better

## 2016-03-04 NOTE — Progress Notes (Signed)
Pre visit review using our clinic review tool, if applicable. No additional management support is needed unless otherwise documented below in the visit note. 

## 2016-03-04 NOTE — Assessment & Plan Note (Signed)
Here for hospital f/u - in Feb for acute chest pain  Cardiac w/u - neg for MI and L cath/ LV fxn were fairly stable Pt feels good about this and feels better to get on track with healthy diet/exercise and weight loss  He feels better staying on plavix at this time  Will d/w his cardiologist at f/u also  No GERD or other symptoms today to cause cp   Labs/studies rev with pt in detail

## 2016-03-15 ENCOUNTER — Other Ambulatory Visit: Payer: Self-pay | Admitting: Family Medicine

## 2016-03-15 ENCOUNTER — Other Ambulatory Visit: Payer: Self-pay | Admitting: Internal Medicine

## 2016-03-20 ENCOUNTER — Other Ambulatory Visit: Payer: Self-pay | Admitting: Family Medicine

## 2016-03-25 ENCOUNTER — Emergency Department (HOSPITAL_COMMUNITY): Payer: 59

## 2016-03-25 ENCOUNTER — Encounter (HOSPITAL_COMMUNITY): Payer: Self-pay | Admitting: Emergency Medicine

## 2016-03-25 ENCOUNTER — Observation Stay (HOSPITAL_COMMUNITY): Payer: 59

## 2016-03-25 ENCOUNTER — Observation Stay (HOSPITAL_COMMUNITY)
Admission: EM | Admit: 2016-03-25 | Discharge: 2016-03-29 | Disposition: A | Discharge status: Home / Self Care | Payer: 59 | Attending: Internal Medicine | Admitting: Internal Medicine

## 2016-03-25 DIAGNOSIS — K219 Gastro-esophageal reflux disease without esophagitis: Secondary | ICD-10-CM | POA: Diagnosis present

## 2016-03-25 DIAGNOSIS — K760 Fatty (change of) liver, not elsewhere classified: Secondary | ICD-10-CM | POA: Diagnosis present

## 2016-03-25 DIAGNOSIS — Z6830 Body mass index (BMI) 30.0-30.9, adult: Secondary | ICD-10-CM | POA: Diagnosis not present

## 2016-03-25 DIAGNOSIS — K8001 Calculus of gallbladder with acute cholecystitis with obstruction: Secondary | ICD-10-CM

## 2016-03-25 DIAGNOSIS — Z95810 Presence of automatic (implantable) cardiac defibrillator: Secondary | ICD-10-CM | POA: Diagnosis not present

## 2016-03-25 DIAGNOSIS — I509 Heart failure, unspecified: Secondary | ICD-10-CM | POA: Diagnosis not present

## 2016-03-25 DIAGNOSIS — R4182 Altered mental status, unspecified: Secondary | ICD-10-CM | POA: Insufficient documentation

## 2016-03-25 DIAGNOSIS — I252 Old myocardial infarction: Secondary | ICD-10-CM | POA: Diagnosis not present

## 2016-03-25 DIAGNOSIS — Z955 Presence of coronary angioplasty implant and graft: Secondary | ICD-10-CM | POA: Diagnosis not present

## 2016-03-25 DIAGNOSIS — E669 Obesity, unspecified: Secondary | ICD-10-CM | POA: Insufficient documentation

## 2016-03-25 DIAGNOSIS — I2510 Atherosclerotic heart disease of native coronary artery without angina pectoris: Secondary | ICD-10-CM | POA: Diagnosis not present

## 2016-03-25 DIAGNOSIS — R0602 Shortness of breath: Secondary | ICD-10-CM

## 2016-03-25 DIAGNOSIS — K8010 Calculus of gallbladder with chronic cholecystitis without obstruction: Secondary | ICD-10-CM

## 2016-03-25 DIAGNOSIS — K829 Disease of gallbladder, unspecified: Secondary | ICD-10-CM

## 2016-03-25 DIAGNOSIS — K8020 Calculus of gallbladder without cholecystitis without obstruction: Secondary | ICD-10-CM | POA: Insufficient documentation

## 2016-03-25 DIAGNOSIS — Z7984 Long term (current) use of oral hypoglycemic drugs: Secondary | ICD-10-CM | POA: Diagnosis not present

## 2016-03-25 DIAGNOSIS — I5022 Chronic systolic (congestive) heart failure: Secondary | ICD-10-CM | POA: Diagnosis not present

## 2016-03-25 DIAGNOSIS — R1011 Right upper quadrant pain: Secondary | ICD-10-CM | POA: Diagnosis present

## 2016-03-25 DIAGNOSIS — Z7982 Long term (current) use of aspirin: Secondary | ICD-10-CM | POA: Insufficient documentation

## 2016-03-25 DIAGNOSIS — R079 Chest pain, unspecified: Secondary | ICD-10-CM

## 2016-03-25 DIAGNOSIS — R109 Unspecified abdominal pain: Secondary | ICD-10-CM | POA: Diagnosis present

## 2016-03-25 DIAGNOSIS — E663 Overweight: Secondary | ICD-10-CM | POA: Diagnosis present

## 2016-03-25 DIAGNOSIS — E785 Hyperlipidemia, unspecified: Secondary | ICD-10-CM | POA: Diagnosis not present

## 2016-03-25 DIAGNOSIS — Z87891 Personal history of nicotine dependence: Secondary | ICD-10-CM | POA: Insufficient documentation

## 2016-03-25 DIAGNOSIS — R0902 Hypoxemia: Secondary | ICD-10-CM | POA: Diagnosis present

## 2016-03-25 DIAGNOSIS — K819 Cholecystitis, unspecified: Secondary | ICD-10-CM

## 2016-03-25 DIAGNOSIS — K8012 Calculus of gallbladder with acute and chronic cholecystitis without obstruction: Secondary | ICD-10-CM | POA: Diagnosis not present

## 2016-03-25 DIAGNOSIS — Z7902 Long term (current) use of antithrombotics/antiplatelets: Secondary | ICD-10-CM | POA: Diagnosis not present

## 2016-03-25 DIAGNOSIS — Z79899 Other long term (current) drug therapy: Secondary | ICD-10-CM | POA: Insufficient documentation

## 2016-03-25 DIAGNOSIS — I959 Hypotension, unspecified: Secondary | ICD-10-CM | POA: Insufficient documentation

## 2016-03-25 DIAGNOSIS — E119 Type 2 diabetes mellitus without complications: Secondary | ICD-10-CM

## 2016-03-25 HISTORY — DX: Cholecystitis, unspecified: K81.9

## 2016-03-25 LAB — COMPREHENSIVE METABOLIC PANEL
ALBUMIN: 3.7 g/dL (ref 3.5–5.0)
ALT: 30 U/L (ref 17–63)
AST: 28 U/L (ref 15–41)
Alkaline Phosphatase: 56 U/L (ref 38–126)
Anion gap: 13 (ref 5–15)
BUN: 14 mg/dL (ref 6–20)
CHLORIDE: 106 mmol/L (ref 101–111)
CO2: 20 mmol/L — ABNORMAL LOW (ref 22–32)
CREATININE: 0.93 mg/dL (ref 0.61–1.24)
Calcium: 9.4 mg/dL (ref 8.9–10.3)
GFR calc Af Amer: >60 mL/min (ref >60)
GFR calc non Af Amer: >60 mL/min (ref >60)
GLUCOSE: 182 mg/dL — AB (ref 65–99)
Potassium: 4 mmol/L (ref 3.5–5.1)
Sodium: 139 mmol/L (ref 135–145)
Total Bilirubin: 1.7 mg/dL — ABNORMAL HIGH (ref 0.3–1.2)
Total Protein: 6.4 g/dL — ABNORMAL LOW (ref 6.5–8.1)

## 2016-03-25 LAB — CBC WITH DIFFERENTIAL/PLATELET
BASOS ABS: 0 10*3/uL (ref 0.0–0.1)
BASOS PCT: 0 %
Eosinophils Absolute: 0 10*3/uL (ref 0.0–0.7)
Eosinophils Relative: 0 %
HEMATOCRIT: 43.8 % (ref 39.0–52.0)
Hemoglobin: 14.8 g/dL (ref 13.0–17.0)
LYMPHS PCT: 9 %
Lymphs Abs: 0.6 10*3/uL — ABNORMAL LOW (ref 0.7–4.0)
MCH: 31.1 pg (ref 26.0–34.0)
MCHC: 33.8 g/dL (ref 30.0–36.0)
MCV: 92 fL (ref 78.0–100.0)
MONO ABS: 0.1 10*3/uL (ref 0.1–1.0)
Monocytes Relative: 1 %
NEUTROS ABS: 6.6 10*3/uL (ref 1.7–7.7)
Neutrophils Relative %: 90 %
PLATELETS: 175 10*3/uL (ref 150–400)
RBC: 4.76 MIL/uL (ref 4.22–5.81)
RDW: 13 % (ref 11.5–15.5)
WBC: 7.3 10*3/uL (ref 4.0–10.5)

## 2016-03-25 LAB — I-STAT ARTERIAL BLOOD GAS, ED
Bicarbonate: 23.9 mEq/L (ref 20.0–24.0)
O2 Saturation: 92 %
PH ART: 7.436 (ref 7.350–7.450)
TCO2: 25 mmol/L (ref 0–100)
pCO2 arterial: 35.5 mmHg (ref 35.0–45.0)
pO2, Arterial: 62 mmHg — ABNORMAL LOW (ref 80.0–100.0)

## 2016-03-25 LAB — TROPONIN I: Troponin I: <0.03 ng/mL (ref <0.031)

## 2016-03-25 LAB — GLUCOSE, CAPILLARY: GLUCOSE-CAPILLARY: 171 mg/dL — AB (ref 65–99)

## 2016-03-25 MED ORDER — TECHNETIUM TC 99M MEBROFENIN IV KIT
5.1900 | PACK | Freq: Once | INTRAVENOUS | Status: AC | PRN
  Administered 2016-03-25: 5 via INTRAVENOUS

## 2016-03-25 MED ORDER — ENOXAPARIN SODIUM 40 MG/0.4ML ~~LOC~~ SOLN
40.0000 mg | SUBCUTANEOUS | Status: DC
  Administered 2016-03-25 – 2016-03-28 (×4): 40 mg via SUBCUTANEOUS
  Filled 2016-03-25 (×4): qty 0.4

## 2016-03-25 MED ORDER — ACETAMINOPHEN 325 MG PO TABS
650.0000 mg | ORAL_TABLET | Freq: Four times a day (QID) | ORAL | Status: DC | PRN
  Administered 2016-03-26: 650 mg via ORAL
  Filled 2016-03-25: qty 2

## 2016-03-25 MED ORDER — MORPHINE SULFATE (PF) 2 MG/ML IV SOLN: 1.0000 mg | INTRAVENOUS | Status: DC | PRN

## 2016-03-25 MED ORDER — ONDANSETRON HCL 4 MG/2ML IJ SOLN: 4.0000 mg | Freq: Four times a day (QID) | INTRAMUSCULAR | Status: DC | PRN

## 2016-03-25 MED ORDER — FENTANYL CITRATE (PF) 100 MCG/2ML IJ SOLN
50.0000 ug | Freq: Once | INTRAMUSCULAR | Status: AC
  Administered 2016-03-25: 50 ug via INTRAVENOUS
  Filled 2016-03-25: qty 2

## 2016-03-25 MED ORDER — ASPIRIN EC 81 MG PO TBEC
81.0000 mg | DELAYED_RELEASE_TABLET | Freq: Every day | ORAL | Status: DC
  Filled 2016-03-25: qty 1

## 2016-03-25 MED ORDER — ACETAMINOPHEN 650 MG RE SUPP: 650.0000 mg | Freq: Four times a day (QID) | RECTAL | Status: DC | PRN

## 2016-03-25 MED ORDER — IOPAMIDOL (ISOVUE-370) INJECTION 76%
INTRAVENOUS | Status: AC
  Administered 2016-03-25: 100 mL
  Filled 2016-03-25: qty 100

## 2016-03-25 MED ORDER — OXYCODONE HCL 5 MG PO TABS
5.0000 mg | ORAL_TABLET | Freq: Four times a day (QID) | ORAL | Status: DC | PRN
Start: 2016-03-25 — End: 2016-03-27
  Administered 2016-03-25 – 2016-03-26 (×3): 5 mg via ORAL
  Filled 2016-03-25 (×3): qty 1

## 2016-03-25 MED ORDER — ONDANSETRON HCL 4 MG/2ML IJ SOLN
4.0000 mg | Freq: Three times a day (TID) | INTRAMUSCULAR | Status: DC | PRN
Start: 2016-03-25 — End: 2016-03-25

## 2016-03-25 MED ORDER — FENTANYL CITRATE (PF) 100 MCG/2ML IJ SOLN
25.0000 ug | INTRAMUSCULAR | Status: DC | PRN
  Filled 2016-03-25: qty 2

## 2016-03-25 MED ORDER — SODIUM CHLORIDE 0.9 % IV SOLN
INTRAVENOUS | Status: AC
Start: 2016-03-25 — End: 2016-03-26

## 2016-03-25 MED ORDER — ASPIRIN 81 MG PO TABS: 81.0000 mg | ORAL_TABLET | Freq: Every day | ORAL | Status: DC

## 2016-03-25 MED ORDER — CLOPIDOGREL BISULFATE 75 MG PO TABS
75.0000 mg | ORAL_TABLET | Freq: Every day | ORAL | Status: DC
  Administered 2016-03-25: 75 mg via ORAL
  Filled 2016-03-25 (×2): qty 1

## 2016-03-25 MED ORDER — INSULIN ASPART 100 UNIT/ML ~~LOC~~ SOLN: 0.0000 [IU] | Freq: Every day | SUBCUTANEOUS | Status: DC

## 2016-03-25 MED ORDER — CARVEDILOL 25 MG PO TABS
25.0000 mg | ORAL_TABLET | Freq: Two times a day (BID) | ORAL | Status: DC
  Administered 2016-03-26 – 2016-03-28 (×4): 25 mg via ORAL
  Filled 2016-03-25 (×4): qty 1

## 2016-03-25 MED ORDER — INSULIN ASPART 100 UNIT/ML ~~LOC~~ SOLN
0.0000 [IU] | Freq: Three times a day (TID) | SUBCUTANEOUS | Status: DC
  Administered 2016-03-26 (×2): 3 [IU] via SUBCUTANEOUS
  Administered 2016-03-26 – 2016-03-27 (×2): 2 [IU] via SUBCUTANEOUS
  Administered 2016-03-28: 3 [IU] via SUBCUTANEOUS

## 2016-03-25 MED ORDER — ONDANSETRON HCL 4 MG PO TABS: 4.0000 mg | ORAL_TABLET | Freq: Four times a day (QID) | ORAL | Status: DC | PRN

## 2016-03-25 MED ORDER — ATORVASTATIN CALCIUM 80 MG PO TABS
80.0000 mg | ORAL_TABLET | Freq: Every day | ORAL | Status: DC
  Administered 2016-03-26 – 2016-03-28 (×2): 80 mg via ORAL
  Filled 2016-03-25 (×2): qty 1

## 2016-03-25 NOTE — ED Notes (Signed)
[facility] to transport pt to floor after scan.

## 2016-03-25 NOTE — ED Notes (Signed)
[facility] staff aware pt will be delayed in nuclear.

## 2016-03-25 NOTE — ED Notes (Signed)
Patient transported to CT 

## 2016-03-25 NOTE — H&P (Signed)
[facility] History and Physical  Harry Payne [id number] DOB: 01/18/1951 DOA: 03/25/2016  Referring physician: Marcene Brawn pa PCP: Loura Pardon, MD   Chief Complaint: RUQ pain/nausea/vomiting  HPI: Harry Payne is a 65 y.o. male past medical history that includes CAD nonobstructive status post recent cardiac cath, AICD, CHF, diabetes presents to the emergency department chief complaint right-sided abdominal/chest pain nausea and shortness of breath. Initial evaluation reveals possible acute cholecystitis, intermittent hypoxia.  Information is obtained from the patient and the chart. Patient reports doing well in his usual state of health until last evening he developed sudden right upper quadrant abdominal pain. He describes the pain is sharp constant rates the pain a 10 out of 10 at worse 7 out of 10 at best. He reports some positions relieve the pain particularly lying on his left side. Associated symptoms include nausea vomiting with mild colored emesis. He denies coffee ground emesis. He also had some shortness of breath. Denies fever chills headache chest pain palpitations syncope or near-syncope. He denies dysuria hematuria frequency or urgency. He denies any diarrhea constipation melena bright red blood per rectum. He denies lower extremity edema or orthopnea.   In the emergency department he is afebrile hemodynamically stable slightly tachycardic mildly hypoxic with tachypnea. In the emergency department he is given 2 nitroglycerin Zofran 4 mg with no relief. Also provided with fentanyl as good relief.  Review of Systems:  10 point review of systems complete and all systems are negative except as indicated in the history of present illness  Past Medical History  Diagnosis Date  . CAD (coronary artery disease)   . GERD (gastroesophageal reflux disease)   . Hx of adenomatous colonic polyps   . Hemorrhoids   . AICD (automatic cardioverter/defibrillator) present   .  Hypotension   . CHF (congestive heart failure) ([place])   . Anterior myocardial infarction ([place]) 2004 X 2  . Hyperlipidemia   . Type II diabetes mellitus [facility])    Past Surgical History  Procedure Laterality Date  . Closed reduction hand fracture Left 1990  . Ep implantable device N/A 06/14/2015    Procedure: ICD Implant;  Surgeon: Deboraha Sprang, MD;  Location: [place] [facility];  Service: Cardiovascular;  Laterality: N/A;  . Coronary angioplasty with stent placement  2004    "2"  . Cardiac catheterization  2012    "no stent"  . Cardiac catheterization N/A 02/27/2016    Procedure: Left Heart Cath and Coronary Angiography;  Surgeon: Peter M Martinique, MD;  Location: [place] [facility];  Service: Cardiovascular;  Laterality: N/A;   Social History:  reports that he quit smoking about 14 years ago. His smoking use included Cigarettes. He has a 92.5 pack-year smoking history. He has never used smokeless tobacco. He reports that he does not drink alcohol or use illicit drugs. 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 Morphine And Related Other (See Comments)    Tachycardia, goes crazy    Family History  Problem Relation Age of Onset  . Prostate cancer Father   . Diabetes Brother     x3  . Heart disease Brother      Prior to Admission medications   Medication Sig Start Date End Date Taking? Authorizing Provider  acetaminophen (TYLENOL) 500 MG tablet Take 500 mg by mouth every 6 (six) hours as needed for mild pain or moderate pain.  Yes Historical Provider, MD  aspirin 81 MG tablet Take 81 mg by mouth daily.    Yes Historical Provider, MD  atorvastatin (LIPITOR) 80 MG tablet TAKE 1 TABLET (80 MG TOTAL) BY MOUTH DAILY. 12/11/15  Yes Shaune Pascal Bensimhon, MD  carvedilol (COREG) 25 MG tablet TAKE 1 TABLET (25 MG TOTAL) BY MOUTH 2 (TWO) TIMES DAILY WITH A MEAL. 01/09/16  Yes Abner Greenspan, MD    clopidogrel (PLAVIX) 75 MG tablet TAKE 1 TABLET (75 MG TOTAL) BY MOUTH DAILY. 03/15/16  Yes Shaune Pascal Bensimhon, MD  glipiZIDE (GLUCOTROL XL) 5 MG 24 hr tablet TAKE 1 TABLET (5 MG TOTAL) BY MOUTH DAILY WITH BREAKFAST. 01/12/16  Yes Abner Greenspan, MD  metFORMIN (GLUCOPHAGE) 1000 MG tablet TAKE 1 TABLET (1,000 MG TOTAL) BY MOUTH 2 (TWO) TIMES DAILY WITH A MEAL. 03/15/16  Yes Marne A Tower, MD  NITROSTAT 0.4 MG SL tablet PLACE 1 TABLET (0.4 MG TOTAL) UNDER THE TONGUE EVERY 5 (FIVE) MINUTES AS NEEDED. Patient taking differently: PLACE 1 TABLET (0.4 MG TOTAL) UNDER THE TONGUE EVERY 5 (FIVE) MINUTES AS NEEDED FOR CHEST PAIN 11/01/14  Yes Abner Greenspan, MD  Omega-3 Fatty Acids (FISH OIL) 1000 MG CAPS Take 1 capsule by mouth daily.    Yes Historical Provider, MD  ramipril (ALTACE) 5 MG capsule TAKE 1 CAPSULE (5 MG TOTAL) BY MOUTH DAILY. 03/20/16  Yes Marne A Tower, MD  ranitidine (ZANTAC) 150 MG tablet TAKE 1 TABLET (150 MG TOTAL) BY MOUTH 2 (TWO) TIMES DAILY. 02/23/16  Yes Abner Greenspan, MD  vitamin B-12 (CYANOCOBALAMIN) 1000 MCG tablet Take 1,000 mcg by mouth daily.     Yes Historical Provider, MD  ONE TOUCH ULTRA TEST test strip CHECK BLOOD SUGAR ONCE DAY AS NEEDED FOR DM E11.9 12/27/15   Abner Greenspan, MD   Physical Exam: Filed Vitals:   03/25/16 1111 03/25/16 1145 03/25/16 1215 03/25/16 1230  BP: 132/74 111/61 124/84 117/71  Pulse: 110 103 116 100  Resp: 25 31 22 21   Height:      Weight:      SpO2: 92% 89% 92% 92%    Wt Readings from Last 3 Encounters:  03/25/16 99.791 kg (220 lb)  03/04/16 102.4 kg (225 lb 12 oz)  02/27/16 100.699 kg (222 lb)    General:  Appears calm and comfortable, lying on left side Eyes: PERRL, normal lids, irises & conjunctiva ENT: grossly normal hearing, lips & tongue, mucous membranes of his mouth are slightly dry Neck: no LAD, masses or thyromegaly Cardiovascular: RRR, no m/r/g. No LE edema. Finger nailbed slightly cyanotic Respiratory: CTA bilaterally, no w/r/r.  Normal respiratory effort. Breath sounds somewhat distant Abdomen: soft, only distended ntnd 3 sluggish bowel sounds no guarding or rebounding Skin: no rash or induration seen on limited exam, slightly yellow Hint Musculoskeletal: grossly normal tone BUE/BLE Psychiatric: grossly normal mood and affect, speech fluent and appropriate Neurologic: grossly non-focal. Speech clear facial symmetry           Labs on Admission:  Basic Metabolic Panel:  Recent Labs Lab 03/25/16 0630  NA 139  K 4.0  CL 106  CO2 20*  GLUCOSE 182*  BUN 14  CREATININE 0.93  CALCIUM 9.4   Liver Function Tests:  Recent Labs Lab 03/25/16 0630  AST 28  ALT 30  ALKPHOS 56  BILITOT 1.7*  PROT 6.4*  ALBUMIN 3.7   No results for input(s): LIPASE, AMYLASE in the last 168 hours. No results for input(s):  AMMONIA in the last 168 hours. CBC:  Recent Labs Lab 03/25/16 0630  WBC 7.3  NEUTROABS 6.6  HGB 14.8  HCT 43.8  MCV 92.0  PLT 175   Cardiac Enzymes:  Recent Labs Lab 03/25/16 0630 03/25/16 1002  TROPONINI <0.03 <0.03    BNP (last 3 results)  Recent Labs  02/25/16 2335  BNP 34.7    ProBNP (last 3 results) No results for input(s): PROBNP in the last 8760 hours.  CBG: No results for input(s): GLUCAP in the last 168 hours.  Radiological Exams on Admission: Ct Angio Chest Pe W/cm &/or Wo Cm  03/25/2016  CLINICAL DATA:  Right upper chest pain since yesterday afternoon. EXAM: CT ANGIOGRAPHY CHEST WITH CONTRAST TECHNIQUE: Multidetector CT imaging of the chest was performed using the standard protocol during bolus administration of intravenous contrast. Multiplanar CT image reconstructions and MIPs were obtained to evaluate the vascular anatomy. CONTRAST:  100 cc Isovue 370 COMPARISON:  None. FINDINGS: Mediastinum/Lymph Nodes: Some of the most peripheral segmental and subsegmental pulmonary arteries cannot be definitively characterize due to mild patient breathing motion artifact. There is no  pulmonary embolism identified within the main, lobar or central segmental pulmonary arteries bilaterally. Scattered atherosclerotic changes are seen along the walls of the normal-caliber thoracic aorta. No aortic aneurysm or dissection. Heart size is upper normal. No pericardial effusion. Coronary artery calcifications noted, particularly dense within the left anterior descending coronary artery. Thinning of the left ventricular apex, with associated apical calcifications, indicating a previous infarction. No masses or enlarged lymph nodes within the mediastinum or perihilar regions. Mild prominence of the left thyroid lobe without definite mass. Lungs/Pleura: Lungs are clear. No pneumonia. No pleural effusion. No pneumothorax. Some lung detail again difficult to definitively characterize due to patient breathing motion artifact. Trachea and central bronchi are unremarkable. Upper abdomen: No acute findings. Single stone in the gallbladder but no evidence of acute cholecystitis. Probable fatty infiltration of the liver. Benign fat - containing mass within the left adrenal gland. Bilateral renal cysts are incompletely imaged. Musculoskeletal: No chest wall mass or suspicious bone lesions identified. Mild degenerative spurring within the thoracic spine. Review of the MIP images confirms the above findings. IMPRESSION: 1. No pulmonary embolism seen, with mild study limitations detailed above. 2. Lungs are clear. No pneumonia or pleural effusion. Again, mildly limited characterization due to patient breathing motion artifact. 3. Heart size is upper normal. No pericardial effusion. Coronary artery calcifications, particularly dense within the left anterior descending coronary artery. Recommend correlation with any possible associated cardiac symptoms. Thinning of the left ventricular apex, with associated calcifications, compatible with a previous MI. 4. Cholelithiasis without evidence of acute cholecystitis. Probable  fatty infiltration of the liver. Bilateral renal cysts are incompletely imaged. No acute findings seen within the upper abdomen. 5. Mild prominence of the left thyroid lobe, with inferior retrosternal extension, but without definite thyroid mass. Recommend nonemergent follow-up with nuclear medicine thyroid scan. Electronically Signed   By: Franki Cabot M.D.   On: 03/25/2016 08:51   US Abdomen Limited Ruq  03/25/2016  CLINICAL DATA:  Lower chest and upper abdominal pain with nausea for 1 day EXAM: US ABDOMEN LIMITED - RIGHT UPPER QUADRANT COMPARISON:  None. FINDINGS: Gallbladder: There is a 1.1 cm calculus adherent in the neck of the gallbladder. The gallbladder wall is mildly thickened. There is no pericholecystic fluid. No sonographic Murphy sign noted by sonographer. Common bile duct: Diameter: 3 mm. There is no intrahepatic or extrahepatic biliary duct dilatation. Liver:  The liver echogenicity is diffusely increased. Near the gallbladder fossa, there is an area of relative decreased attenuation, likely due to fatty sparing measuring 1.7 x 1.2 x 1.2 cm. There is no other evidence of focal liver lesion. IMPRESSION: Calculus adherent in the neck of the gallbladder with wall thickening of the gallbladder. Suspect early acute cholecystitis. Liver echogenicity overall is increased consistent with hepatic steatosis with what appears to be fatty sparing near the gallbladder fossa. The sensitivity of ultrasound for focal liver lesions given this degree of hepatic steatosis is diminished. Electronically Signed   By: Lowella Grip III M.D.   On: 03/25/2016 11:05    EKG: Independently reviewed. Sinus tachycardia  Assessment/Plan Principal Problem:   Cholecystitis Active Problems:   Non-insulin dependent type 2 diabetes mellitus (HCC)   Hyperlipidemia   GERD   Obesity   CAD (coronary artery disease), native coronary artery   AICD (automatic cardioverter/defibrillator) present   Chronic systolic CHF  (congestive heart failure) (HCC)   Abdominal pain   Hypoxia   Steatosis, liver  #1. Abdominal pain/right sided chest pain concerning for acute cholecystitis per CT. Troponins negative 2. EKG without acute changes. CT of chest with no evidence of PE but does show gallstone. Abdominal ultrasound reveals calculus adherent in the neck of the gallbladder with thickening of gallbladder wall as well as liver echogenicity. Total bilirubin 1.7. Of note patient had cardiac cath on 2/28 revealing an EF 35-45% and CAD with no obstruction. -Admit to telemetry -hepatitis panel -Nothing by mouth except sips with meds -Gentle IV fluids -Pain management -anti-emetic -Await general surgery input -Benefit from GI workup outpatient  #2. Hypoxia. Likely related to pain in the setting of chronic CHF. Oxygen saturation level 89% with a respiratory rate of 32 on room air. Approved at time of admission -Oxygen supplementation -When necessary nebs -Wean oxygen as able  #3. Diabetes type 2. He is on oral agents at home. Serum glucose 182 on admission -We'll hold his oral agents for now -Will obtain a hemoglobin A1c -Will use sliding scale insulin for optimal control  4. CAD/status post AICD. Troponin negative 2 EKG without acute changes. Recent cardiac cath revealing an EF of 35-45% and CAD with no obstruction. Has history of MI in the past. Review indicates anterior infarction in 2004 with stenting  #4. Chronic systolic heart failure. EF with 35-45% per recent cath. Grade 1 diastolic dysfunction. Appears compensated. Home ACE inhibitor on hold for now due to initially soft blood pressure -Continue beta blocker -Resume ACE inhibitor when appropriate -   Dr wyatt general surgery  Code Status: full DVT Prophylaxis: Family Communication: none present Disposition Plan: home when ready  Time spent: [facility]

## 2016-03-25 NOTE — Consult Note (Signed)
Reason for Consult:Cholelithiasis Referring Physician: Alyse Low, PA-C  Harry Payne is an 65 y.o. male.  HPI: Patient started having right sided sub-mammary chest pain Sunday morning without any other symptoms initially, but subsequently had nausea and vomiting this morning and he came into the ED.  Had a previous episode last year which led to a cardiac catheterization which did not reveal any cardiac disease that required treatment.  He has a history of CHF and MI x 2, with an AICD in place since last year.  In his work up today he got a CT scan of his abdomen which showed a single gallstone without evidence of cholecystitis.  A surgical consultation was requested.  Past Medical History  Diagnosis Date  . CAD (coronary artery disease)   . GERD (gastroesophageal reflux disease)   . Hx of adenomatous colonic polyps   . Hemorrhoids   . AICD (automatic cardioverter/defibrillator) present   . Hypotension   . CHF (congestive heart failure) ([place])   . Anterior myocardial infarction ([place]) 2004 X 2  . Hyperlipidemia   . Type II diabetes mellitus [facility])     Past Surgical History  Procedure Laterality Date  . Closed reduction hand fracture Left 1990  . Ep implantable device N/A 06/14/2015    Procedure: ICD Implant;  Surgeon: Deboraha Sprang, MD;  Location: [place] [facility];  Service: Cardiovascular;  Laterality: N/A;  . Coronary angioplasty with stent placement  2004    "2"  . Cardiac catheterization  2012    "no stent"  . Cardiac catheterization N/A 02/27/2016    Procedure: Left Heart Cath and Coronary Angiography;  Surgeon: Peter M Martinique, MD;  Location: [place] [facility];  Service: Cardiovascular;  Laterality: N/A;    Family History  Problem Relation Age of Onset  . Prostate cancer Father   . Diabetes Brother     x3  . Heart disease Brother     Social History:  reports that he quit smoking about 14 years ago. His smoking use included Cigarettes. He has a 92.5 pack-year  smoking history. He has never used smokeless tobacco. He reports that he does not drink alcohol or use illicit drugs.  Allergies:  Allergies  Allergen Reactions  . Morphine And Related Other (See Comments)    Tachycardia, goes crazy    Medications: I have reviewed the patient's current medications.  Results for orders placed or performed during the hospital encounter of 03/25/16 (from the past 48 hour(s))  Troponin I     Status: None   Collection Time: 03/25/16  6:30 AM  Result Value Ref Range   Troponin I <0.03 <0.031 ng/mL    Comment:        NO INDICATION OF MYOCARDIAL INJURY.   CBC with Differential/Platelet     Status: Abnormal   Collection Time: 03/25/16  6:30 AM  Result Value Ref Range   WBC 7.3 4.0 - 10.5 K/uL   RBC 4.76 4.22 - 5.81 MIL/uL   Hemoglobin 14.8 13.0 - 17.0 g/dL   HCT 43.8 39.0 - 52.0 %   MCV 92.0 78.0 - 100.0 fL   MCH 31.1 26.0 - 34.0 pg   MCHC 33.8 30.0 - 36.0 g/dL   RDW 13.0 11.5 - 15.5 %   Platelets 175 150 - 400 K/uL   Neutrophils Relative % 90 %   Neutro Abs 6.6 1.7 - 7.7 K/uL   Lymphocytes Relative 9 %   Lymphs Abs 0.6 (L) 0.7 - 4.0 K/uL  Monocytes Relative 1 %   Monocytes Absolute 0.1 0.1 - 1.0 K/uL   Eosinophils Relative 0 %   Eosinophils Absolute 0.0 0.0 - 0.7 K/uL   Basophils Relative 0 %   Basophils Absolute 0.0 0.0 - 0.1 K/uL  Comprehensive metabolic panel     Status: Abnormal   Collection Time: 03/25/16  6:30 AM  Result Value Ref Range   Sodium 139 135 - 145 mmol/L   Potassium 4.0 3.5 - 5.1 mmol/L   Chloride 106 101 - 111 mmol/L   CO2 20 (L) 22 - 32 mmol/L   Glucose, Bld 182 (H) 65 - 99 mg/dL   BUN 14 6 - 20 mg/dL   Creatinine, Ser 0.93 0.61 - 1.24 mg/dL   Calcium 9.4 8.9 - 10.3 mg/dL   Total Protein 6.4 (L) 6.5 - 8.1 g/dL   Albumin 3.7 3.5 - 5.0 g/dL   AST 28 15 - 41 U/L   ALT 30 17 - 63 U/L   Alkaline Phosphatase 56 38 - 126 U/L   Total Bilirubin 1.7 (H) 0.3 - 1.2 mg/dL   GFR calc non Af Amer >60 >60 mL/min   GFR calc Af  Amer >60 >60 mL/min    Comment: (NOTE) The eGFR has been calculated using the CKD EPI equation. This calculation has not been validated in all clinical situations. eGFR's persistently <60 mL/min signify possible Chronic Kidney Disease.    Anion gap 13 5 - 15  Troponin I     Status: None   Collection Time: 03/25/16 10:02 AM  Result Value Ref Range   Troponin I <0.03 <0.031 ng/mL    Comment:        NO INDICATION OF MYOCARDIAL INJURY.     Ct Angio Chest Pe W/cm &/or Wo Cm  03/25/2016  CLINICAL DATA:  Right upper chest pain since yesterday afternoon. EXAM: CT ANGIOGRAPHY CHEST WITH CONTRAST TECHNIQUE: Multidetector CT imaging of the chest was performed using the standard protocol during bolus administration of intravenous contrast. Multiplanar CT image reconstructions and MIPs were obtained to evaluate the vascular anatomy. CONTRAST:  100 cc Isovue 370 COMPARISON:  None. FINDINGS: Mediastinum/Lymph Nodes: Some of the most peripheral segmental and subsegmental pulmonary arteries cannot be definitively characterize due to mild patient breathing motion artifact. There is no pulmonary embolism identified within the main, lobar or central segmental pulmonary arteries bilaterally. Scattered atherosclerotic changes are seen along the walls of the normal-caliber thoracic aorta. No aortic aneurysm or dissection. Heart size is upper normal. No pericardial effusion. Coronary artery calcifications noted, particularly dense within the left anterior descending coronary artery. Thinning of the left ventricular apex, with associated apical calcifications, indicating a previous infarction. No masses or enlarged lymph nodes within the mediastinum or perihilar regions. Mild prominence of the left thyroid lobe without definite mass. Lungs/Pleura: Lungs are clear. No pneumonia. No pleural effusion. No pneumothorax. Some lung detail again difficult to definitively characterize due to patient breathing motion artifact.  Trachea and central bronchi are unremarkable. Upper abdomen: No acute findings. Single stone in the gallbladder but no evidence of acute cholecystitis. Probable fatty infiltration of the liver. Benign fat - containing mass within the left adrenal gland. Bilateral renal cysts are incompletely imaged. Musculoskeletal: No chest wall mass or suspicious bone lesions identified. Mild degenerative spurring within the thoracic spine. Review of the MIP images confirms the above findings. IMPRESSION: 1. No pulmonary embolism seen, with mild study limitations detailed above. 2. Lungs are clear. No pneumonia or pleural effusion. Again, mildly  limited characterization due to patient breathing motion artifact. 3. Heart size is upper normal. No pericardial effusion. Coronary artery calcifications, particularly dense within the left anterior descending coronary artery. Recommend correlation with any possible associated cardiac symptoms. Thinning of the left ventricular apex, with associated calcifications, compatible with a previous MI. 4. Cholelithiasis without evidence of acute cholecystitis. Probable fatty infiltration of the liver. Bilateral renal cysts are incompletely imaged. No acute findings seen within the upper abdomen. 5. Mild prominence of the left thyroid lobe, with inferior retrosternal extension, but without definite thyroid mass. Recommend nonemergent follow-up with nuclear medicine thyroid scan. Electronically Signed   By: Franki Cabot M.D.   On: 03/25/2016 08:51   US Abdomen Limited Ruq  03/25/2016  CLINICAL DATA:  Lower chest and upper abdominal pain with nausea for 1 day EXAM: US ABDOMEN LIMITED - RIGHT UPPER QUADRANT COMPARISON:  None. FINDINGS: Gallbladder: There is a 1.1 cm calculus adherent in the neck of the gallbladder. The gallbladder wall is mildly thickened. There is no pericholecystic fluid. No sonographic Murphy sign noted by sonographer. Common bile duct: Diameter: 3 mm. There is no intrahepatic  or extrahepatic biliary duct dilatation. Liver: The liver echogenicity is diffusely increased. Near the gallbladder fossa, there is an area of relative decreased attenuation, likely due to fatty sparing measuring 1.7 x 1.2 x 1.2 cm. There is no other evidence of focal liver lesion. IMPRESSION: Calculus adherent in the neck of the gallbladder with wall thickening of the gallbladder. Suspect early acute cholecystitis. Liver echogenicity overall is increased consistent with hepatic steatosis with what appears to be fatty sparing near the gallbladder fossa. The sensitivity of ultrasound for focal liver lesions given this degree of hepatic steatosis is diminished. Electronically Signed   By: Lowella Grip III M.D.   On: 03/25/2016 11:05    Review of Systems  Constitutional: Positive for chills. Negative for fever.  HENT: Negative.   Respiratory: Negative.  Negative for cough and hemoptysis.   Cardiovascular: Positive for chest pain (right submammary).  Gastrointestinal: Positive for nausea and vomiting. Negative for abdominal pain.  Genitourinary: Negative.   Musculoskeletal: Negative.   Skin: Negative.   Neurological: Negative.   Psychiatric/Behavioral: Negative.    Blood pressure 117/71, pulse 100, resp. rate 21, height _0  (1.803 m), weight 99.791 kg (220 lb), SpO2 92 %. Physical Exam  Constitutional: He appears well-developed.  Overweight  HENT:  Head: Normocephalic and atraumatic.  Eyes: EOM are normal. Pupils are equal, round, and reactive to light.  Neck: Normal range of motion. Neck supple.  Cardiovascular: Normal rate and normal heart sounds.  Exam reveals no gallop.   No murmur heard. Respiratory: Breath sounds normal. Tachypnea (Seems short of breath at rest) noted. No respiratory distress. He has no wheezes. He has no rales. He exhibits no tenderness.    GI: Soft. Normal appearance and bowel sounds are normal. He exhibits distension. There is no tenderness. There is no  rigidity, no rebound, no guarding and negative Murphy's sign.  Psychiatric: Judgment and thought content normal. His mood appears anxious. He is agitated. Cognition and memory are normal.    Assessment/Plan: Cholelithiasis without proven acute cholecystitis. Needs HIDA to rule out acute cholecystiits--may have been just an episode of biliary colic Normal LFTs Patient also is on Plavix and ASA--would need to discontinue Plavix for at least 3 days prior to surgery.  If the HIDA is negative for acute cholecystitis, the patient can be referred to my office for follow up.  May  need cardiac clearance  Zuhayr Satish Hammers 03/25/2016, 1:01 PM

## 2016-03-25 NOTE — Progress Notes (Signed)
  Pt admitted to the unit. Pt is stable, alert and oriented per baseline. Oriented to room, staff, and call bell. Educated to call for any assistance. Bed in lowest position, call bell within reach- will continue to monitor. 

## 2016-03-25 NOTE — ED Notes (Signed)
Pt still at nuclear med.

## 2016-03-25 NOTE — ED Notes (Signed)
Attempted report x1. 

## 2016-03-25 NOTE — ED Provider Notes (Signed)
CSN: [id number]     Arrival date & time 03/25/16  0609 History   First MD Initiated Contact with Patient 03/25/16 [phone]     Chief Complaint  Patient presents with  . Chest Pain     (Consider location/radiation/quality/duration/timing/severity/associated sxs/prior Treatment) Patient is a 64 y.o. male presenting with chest pain. The history is provided by the patient and the spouse. No language interpreter was used.  Chest Pain Pain location:  R chest Pain quality: aching   Pain radiates to:  Does not radiate Pain radiates to the back: yes   Pain severity:  Moderate Onset quality:  Gradual Duration:  1 day Timing:  Constant Progression:  Worsening Chronicity:  New Context: breathing   Relieved by:  Nothing Worsened by:  Nothing tried Ineffective treatments:  Aspirin, nitroglycerin, oxygen and rest Associated symptoms: back pain and shortness of breath   Risk factors: coronary artery disease, diabetes mellitus, high cholesterol, hypertension and male sex     Past Medical History  Diagnosis Date  . CAD (coronary artery disease)   . GERD (gastroesophageal reflux disease)   . Hx of adenomatous colonic polyps   . Hemorrhoids   . AICD (automatic cardioverter/defibrillator) present   . Hypotension   . CHF (congestive heart failure) ([place])   . Anterior myocardial infarction ([place]) 2004 X 2  . Hyperlipidemia   . Type II diabetes mellitus [facility])    Past Surgical History  Procedure Laterality Date  . Closed reduction hand fracture Left 1990  . Ep implantable device N/A 06/14/2015    Procedure: ICD Implant;  Surgeon: Deboraha Sprang, MD;  Location: [place] [facility];  Service: Cardiovascular;  Laterality: N/A;  . Coronary angioplasty with stent placement  2004    "2"  . Cardiac catheterization  2012    "no stent"  . Cardiac catheterization N/A 02/27/2016    Procedure: Left Heart Cath and Coronary Angiography;  Surgeon: Peter M Martinique, MD;  Location: [place] [facility];  Service:  Cardiovascular;  Laterality: N/A;   Family History  Problem Relation Age of Onset  . Prostate cancer Father   . Diabetes Brother     x3  . Heart disease Brother    Social History  Substance Use Topics  . Smoking status: Former Smoker -- 2.50 packs/day for 37 years    Types: Cigarettes    Quit date: 12/30/2001  . Smokeless tobacco: Never Used  . Alcohol Use: No    Review of Systems  Respiratory: Positive for shortness of breath.   Cardiovascular: Positive for chest pain.  Musculoskeletal: Positive for back pain.  All other systems reviewed and are negative.     Allergies  Review of patient's allergies indicates no known allergies.  Home Medications   Prior to Admission medications   Medication Sig Start Date End Date Taking? Authorizing Provider  atorvastatin (LIPITOR) 80 MG tablet TAKE 1 TABLET (80 MG TOTAL) BY MOUTH DAILY. 12/11/15  Yes Shaune Pascal Bensimhon, MD  carvedilol (COREG) 25 MG tablet TAKE 1 TABLET (25 MG TOTAL) BY MOUTH 2 (TWO) TIMES DAILY WITH A MEAL. 01/09/16  Yes Abner Greenspan, MD  clopidogrel (PLAVIX) 75 MG tablet TAKE 1 TABLET (75 MG TOTAL) BY MOUTH DAILY. 03/15/16  Yes Shaune Pascal Bensimhon, MD  glipiZIDE (GLUCOTROL XL) 5 MG 24 hr tablet TAKE 1 TABLET (5 MG TOTAL) BY MOUTH DAILY WITH BREAKFAST. 01/12/16  Yes Abner Greenspan, MD  metFORMIN (GLUCOPHAGE) 1000 MG tablet TAKE 1 TABLET (1,000 MG TOTAL) BY  MOUTH 2 (TWO) TIMES DAILY WITH A MEAL. 03/15/16  Yes Marne A Tower, MD  NITROSTAT 0.4 MG SL tablet PLACE 1 TABLET (0.4 MG TOTAL) UNDER THE TONGUE EVERY 5 (FIVE) MINUTES AS NEEDED. Patient taking differently: PLACE 1 TABLET (0.4 MG TOTAL) UNDER THE TONGUE EVERY 5 (FIVE) MINUTES AS NEEDED FOR CHEST PAIN 11/01/14  Yes Abner Greenspan, MD  acetaminophen (TYLENOL) 500 MG tablet Take 500 mg by mouth every 6 (six) hours as needed for mild pain or moderate pain.    Historical Provider, MD  aspirin 81 MG tablet Take 81 mg by mouth daily.     Historical Provider, MD  Omega-3 Fatty  Acids (FISH OIL) 1000 MG CAPS Take 1 capsule by mouth daily.     Historical Provider, MD  ONE TOUCH ULTRA TEST test strip CHECK BLOOD SUGAR ONCE DAY AS NEEDED FOR DM E11.9 12/27/15   Abner Greenspan, MD  ramipril (ALTACE) 5 MG capsule TAKE 1 CAPSULE (5 MG TOTAL) BY MOUTH DAILY. 03/20/16   Abner Greenspan, MD  ranitidine (ZANTAC) 150 MG tablet TAKE 1 TABLET (150 MG TOTAL) BY MOUTH 2 (TWO) TIMES DAILY. 02/23/16   Abner Greenspan, MD  vitamin B-12 (CYANOCOBALAMIN) 1000 MCG tablet Take 1,000 mcg by mouth daily.      Historical Provider, MD   Ht 5\' 11"  (1.803 m)  Wt 99.791 kg  BMI 30.70 kg/m2  SpO2 93% Physical Exam  Constitutional: He is oriented to person, place, and time. He appears well-developed and well-nourished.  HENT:  Head: Normocephalic and atraumatic.  Eyes: Conjunctivae and EOM are normal. Pupils are equal, round, and reactive to light.  Neck: Normal range of motion.  Cardiovascular: Normal rate and normal heart sounds.   Pulmonary/Chest: Effort normal and breath sounds normal.  Abdominal: Soft. He exhibits no distension.  Musculoskeletal: Normal range of motion.  Neurological: He is alert and oriented to person, place, and time.  Psychiatric: He has a normal mood and affect.  Nursing note and vitals reviewed. 02 is 91 %.  Pt feels short of breath.  Some relief with 02.   ED Course  Procedures (including critical care time) Labs Review Labs Reviewed  CBC WITH DIFFERENTIAL/PLATELET - Abnormal; Notable for the following:    Lymphs Abs 0.6 (*)    All other components within normal limits  COMPREHENSIVE METABOLIC PANEL - Abnormal; Notable for the following:    CO2 20 (*)    Glucose, Bld 182 (*)    Total Protein 6.4 (*)    Total Bilirubin 1.7 (*)    All other components within normal limits  TROPONIN I    Imaging Review Ct Angio Chest Pe W/cm &/or Wo Cm  03/25/2016  CLINICAL DATA:  Right upper chest pain since yesterday afternoon. EXAM: CT ANGIOGRAPHY CHEST WITH CONTRAST  TECHNIQUE: Multidetector CT imaging of the chest was performed using the standard protocol during bolus administration of intravenous contrast. Multiplanar CT image reconstructions and MIPs were obtained to evaluate the vascular anatomy. CONTRAST:  100 cc Isovue 370 COMPARISON:  None. FINDINGS: Mediastinum/Lymph Nodes: Some of the most peripheral segmental and subsegmental pulmonary arteries cannot be definitively characterize due to mild patient breathing motion artifact. There is no pulmonary embolism identified within the main, lobar or central segmental pulmonary arteries bilaterally. Scattered atherosclerotic changes are seen along the walls of the normal-caliber thoracic aorta. No aortic aneurysm or dissection. Heart size is upper normal. No pericardial effusion. Coronary artery calcifications noted, particularly dense within the left anterior  descending coronary artery. Thinning of the left ventricular apex, with associated apical calcifications, indicating a previous infarction. No masses or enlarged lymph nodes within the mediastinum or perihilar regions. Mild prominence of the left thyroid lobe without definite mass. Lungs/Pleura: Lungs are clear. No pneumonia. No pleural effusion. No pneumothorax. Some lung detail again difficult to definitively characterize due to patient breathing motion artifact. Trachea and central bronchi are unremarkable. Upper abdomen: No acute findings. Single stone in the gallbladder but no evidence of acute cholecystitis. Probable fatty infiltration of the liver. Benign fat - containing mass within the left adrenal gland. Bilateral renal cysts are incompletely imaged. Musculoskeletal: No chest wall mass or suspicious bone lesions identified. Mild degenerative spurring within the thoracic spine. Review of the MIP images confirms the above findings. IMPRESSION: 1. No pulmonary embolism seen, with mild study limitations detailed above. 2. Lungs are clear. No pneumonia or pleural  effusion. Again, mildly limited characterization due to patient breathing motion artifact. 3. Heart size is upper normal. No pericardial effusion. Coronary artery calcifications, particularly dense within the left anterior descending coronary artery. Recommend correlation with any possible associated cardiac symptoms. Thinning of the left ventricular apex, with associated calcifications, compatible with a previous MI. 4. Cholelithiasis without evidence of acute cholecystitis. Probable fatty infiltration of the liver. Bilateral renal cysts are incompletely imaged. No acute findings seen within the upper abdomen. 5. Mild prominence of the left thyroid lobe, with inferior retrosternal extension, but without definite thyroid mass. Recommend nonemergent follow-up with nuclear medicine thyroid scan. Electronically Signed   By: Franki Cabot M.D.   On: 03/25/2016 08:51   I have personally reviewed and evaluated these images and lab results as part of my medical decision-making.   EKG Interpretation   Date/Time:  Monday March 25 2016 06:12:48 EDT Ventricular Rate:  119 PR Interval:  156 QRS Duration: 83 QT Interval:  325 QTC Calculation: 457 R Axis:   71 Text Interpretation:  Age not entered, assumed to be  65 years old for  purpose of ECG interpretation Sinus tachycardia tachycardia more than  prior Confirmed by [place] ([id number]) on 03/25/2016 6:55:28 AM      MDM  Ct shows no evidence of PE.   Ct does show gallstone.   Pt has elevation of tbili to 1.7.    Ultrasound shows possible early acute cholcystitis. Troponin is negative.   Pt had a cardiac cath on 2/28.  EF between 35-45. Cad no acute obstruction.  Pt is more comfortable on reevaluation.  No current pain or shortness of breath.  Pt denies any shortness of breath.  No current pain   Final diagnoses:  Shortness of breath  Chest pain, unspecified chest pain type  Gallbladder disease    I spoke to hospitalist who will admit.  Surgery  Consulted to see    Fransico Meadow, PA-C 03/25/16 1213  Charlesetta Shanks, MD 03/27/16 [phone]

## 2016-03-25 NOTE — ED Notes (Signed)
Tollie Pizza, Admitting NP at bedside.

## 2016-03-25 NOTE — ED Notes (Addendum)
Brought by EMS from home with c/o right sided chest pain, nausea, and sob.  Reports being seen here 5 weeks ago with cp and had a cath which was clean.  sats noted to be 91 % on RA.  Given 2 NTG with little relief enroute, zofran 4mg  IV and took ASA 8 baby po per instructions from EMS dispatch.

## 2016-03-25 NOTE — Progress Notes (Signed)
Spoke with radiologist, Salvatore Marvel, states that scan was abnormal and consistent with cholecystitis. Will notify NP on call.

## 2016-03-26 DIAGNOSIS — I2510 Atherosclerotic heart disease of native coronary artery without angina pectoris: Secondary | ICD-10-CM

## 2016-03-26 DIAGNOSIS — K819 Cholecystitis, unspecified: Secondary | ICD-10-CM | POA: Diagnosis not present

## 2016-03-26 DIAGNOSIS — I5022 Chronic systolic (congestive) heart failure: Secondary | ICD-10-CM | POA: Diagnosis not present

## 2016-03-26 LAB — GLUCOSE, CAPILLARY
GLUCOSE-CAPILLARY: 138 mg/dL — AB (ref 65–99)
GLUCOSE-CAPILLARY: 172 mg/dL — AB (ref 65–99)
GLUCOSE-CAPILLARY: 150 mg/dL — AB (ref 65–99)
GLUCOSE-CAPILLARY: 107 mg/dL — AB (ref 65–99)
Glucose-Capillary: 166 mg/dL — ABNORMAL HIGH (ref 65–99)

## 2016-03-26 LAB — HEMOGLOBIN A1C
HEMOGLOBIN A1C: 6.6 % — AB (ref 4.8–5.6)
MEAN PLASMA GLUCOSE: 143 mg/dL

## 2016-03-26 MED ORDER — CHLORHEXIDINE GLUCONATE CLOTH 2 % EX PADS
6.0000 | MEDICATED_PAD | Freq: Once | CUTANEOUS | Status: AC
  Administered 2016-03-26: 6 via TOPICAL

## 2016-03-26 MED ORDER — DEXTROSE 5 % IV SOLN
2.0000 g | INTRAVENOUS | Status: DC
  Administered 2016-03-26 – 2016-03-29 (×4): 2 g via INTRAVENOUS
  Filled 2016-03-26 (×5): qty 2

## 2016-03-26 MED ORDER — SODIUM CHLORIDE 0.9 % IV BOLUS (SEPSIS)
500.0000 mL | Freq: Once | INTRAVENOUS | Status: AC
  Administered 2016-03-26: 500 mL via INTRAVENOUS

## 2016-03-26 MED ORDER — CHLORHEXIDINE GLUCONATE CLOTH 2 % EX PADS
6.0000 | MEDICATED_PAD | Freq: Once | CUTANEOUS | Status: AC
  Administered 2016-03-27: 6 via TOPICAL

## 2016-03-26 NOTE — Progress Notes (Signed)
[facility] PROGRESS NOTE  KENDRYCK GALANTE [id number] DOB: 12-20-1951 DOA: 03/25/2016 PCP: Loura Pardon, MD  Assessment/Plan:   Acute cholecystitis- HIDA scan confirms acute cholecystitis , continue ceftriaxone. Cholecystectomy scheduled for a.m. general surgery following.  ? Hypoxia- patient has history of systolic congestive heart failure with diastolic heart failure. At this and O2 sats 94% on room air. Will closely monitor the patient after surgery  History of CAD- patient recently had left heart cath on 02/27/2016 which showed nonobstructive CAD, moderate every dysfunction. Cardiology  recommended medical therapy Patient does not have any chest pain at this time. Continue Coreg 25 mg twice a day, Lipitor 80 mg daily  Diabetes mellitus- continue sliding-scale insulin with NovoLog  Risk stratification- patient is moderate risk for surgery due to underlying history of CAD   Code Status: Full code Family Communication: No family present at bedside Disposition Plan: Pending cholecystectomy in a.m.   Consultants: General surgery   Procedures:  None  Antibiotics: Ceftriaxone  HPI/Subjective: 65 y.o. male with a Past Medical History of CAD, GERD, AICD CHF, MI, HLD, DM who presents with likely acute cholecystitis and hypoxemia from likely undiagnosed COPD. No immediate respiratory distress but pt w/ 92.5 pack year of smoking. Quit in 2003.   This morning patient denies any complaints. No abdominal pain.  Objective: Filed Vitals:   03/26/16 0539 03/26/16 1420  BP: 116/71 125/73  Pulse: 101 99  Temp: 98.5 F (36.9 C) 97.7 F (36.5 C)  Resp: 20 20    Intake/Output Summary (Last 24 hours) at 03/26/16 1606 Last data filed at 03/26/16 1228  Gross per 24 hour  Intake    220 ml  Output    550 ml  Net   -330 ml   Filed Weights   03/25/16 0614 03/25/16 1948 03/26/16 0102  Weight: 99.791 kg (220 lb) 103.1 kg (227 lb 4.7 oz) 103.1 kg (227 lb 4.7 oz)     Exam:   General:  Appears in no acute distress  Cardiovascular: S1-S2 regular  Respiratory: Clear to auscultation bilaterally  Abdomen: Soft, nontender, no organomegaly  Musculoskeletal: No cyanosis/clubbing/edema of the lower extremities   Data Reviewed: Basic Metabolic Panel:  Recent Labs Lab 03/25/16 0630  NA 139  K 4.0  CL 106  CO2 20*  GLUCOSE 182*  BUN 14  CREATININE 0.93  CALCIUM 9.4   Liver Function Tests:  Recent Labs Lab 03/25/16 0630  AST 28  ALT 30  ALKPHOS 56  BILITOT 1.7*  PROT 6.4*  ALBUMIN 3.7    CBC:  Recent Labs Lab 03/25/16 0630  WBC 7.3  NEUTROABS 6.6  HGB 14.8  HCT 43.8  MCV 92.0  PLT 175   Cardiac Enzymes:  Recent Labs Lab 03/25/16 0630 03/25/16 1002  TROPONINI <0.03 <0.03   BNP (last 3 results)  Recent Labs  02/25/16 2335  BNP 34.7    ProBNP (last 3 results) No results for input(s): PROBNP in the last 8760 hours.  CBG:  Recent Labs Lab 03/25/16 2128 03/26/16 0742 03/26/16 1136  GLUCAP 171* 138* 172*    No results found for this or any previous visit (from the past 240 hour(s)).   Studies: Ct Angio Chest Pe W/cm &/or Wo Cm  03/25/2016  CLINICAL DATA:  Right upper chest pain since yesterday afternoon. EXAM: CT ANGIOGRAPHY CHEST WITH CONTRAST TECHNIQUE: Multidetector CT imaging of the chest was performed using the standard protocol during bolus administration of intravenous contrast. Multiplanar CT image reconstructions and MIPs were obtained  to evaluate the vascular anatomy. CONTRAST:  100 cc Isovue 370 COMPARISON:  None. FINDINGS: Mediastinum/Lymph Nodes: Some of the most peripheral segmental and subsegmental pulmonary arteries cannot be definitively characterize due to mild patient breathing motion artifact. There is no pulmonary embolism identified within the main, lobar or central segmental pulmonary arteries bilaterally. Scattered atherosclerotic changes are seen along the walls of the normal-caliber  thoracic aorta. No aortic aneurysm or dissection. Heart size is upper normal. No pericardial effusion. Coronary artery calcifications noted, particularly dense within the left anterior descending coronary artery. Thinning of the left ventricular apex, with associated apical calcifications, indicating a previous infarction. No masses or enlarged lymph nodes within the mediastinum or perihilar regions. Mild prominence of the left thyroid lobe without definite mass. Lungs/Pleura: Lungs are clear. No pneumonia. No pleural effusion. No pneumothorax. Some lung detail again difficult to definitively characterize due to patient breathing motion artifact. Trachea and central bronchi are unremarkable. Upper abdomen: No acute findings. Single stone in the gallbladder but no evidence of acute cholecystitis. Probable fatty infiltration of the liver. Benign fat - containing mass within the left adrenal gland. Bilateral renal cysts are incompletely imaged. Musculoskeletal: No chest wall mass or suspicious bone lesions identified. Mild degenerative spurring within the thoracic spine. Review of the MIP images confirms the above findings. IMPRESSION: 1. No pulmonary embolism seen, with mild study limitations detailed above. 2. Lungs are clear. No pneumonia or pleural effusion. Again, mildly limited characterization due to patient breathing motion artifact. 3. Heart size is upper normal. No pericardial effusion. Coronary artery calcifications, particularly dense within the left anterior descending coronary artery. Recommend correlation with any possible associated cardiac symptoms. Thinning of the left ventricular apex, with associated calcifications, compatible with a previous MI. 4. Cholelithiasis without evidence of acute cholecystitis. Probable fatty infiltration of the liver. Bilateral renal cysts are incompletely imaged. No acute findings seen within the upper abdomen. 5. Mild prominence of the left thyroid lobe, with inferior  retrosternal extension, but without definite thyroid mass. Recommend nonemergent follow-up with nuclear medicine thyroid scan. Electronically Signed   By: Franki Cabot M.D.   On: 03/25/2016 08:51   Nm Hepatobiliary Including Gb  03/25/2016  CLINICAL DATA:  65 year old male with right upper quadrant abdominal pain. Cholelithiasis and gallbladder wall thickening on sonogram from earlier today. EXAM: NUCLEAR MEDICINE HEPATOBILIARY IMAGING TECHNIQUE: Sequential images of the abdomen were obtained out to 60 minutes following intravenous administration of radiopharmaceutical. RADIOPHARMACEUTICALS:  5.2 mCi Tc-67m  Choletec IV COMPARISON:  03/25/2016 right upper quadrant abdominal sonogram and chest CT. FINDINGS: Blood pool clearance and radiotracer concentration by the liver appears normal. There is normal common bile duct and small bowel radiotracer excretion by 10 minutes. No radiotracer uptake was seen in the gallbladder after total of 4 hours of imaging (IV morphine was not administered due to a history of morphine allergy). IMPRESSION: 1. No radiotracer uptake within the gallbladder after 4 hours of imaging, compatible with cystic duct obstruction and acute cholecystitis in the correct clinical setting. 2. No scintigraphic evidence of hepatocellular dysfunction or common bile duct obstruction. These results were called by telephone at the time of interpretation on 03/25/2016 at 7:58 pm to RN Inda Merlin, who verbally acknowledged these results. Electronically Signed   By: Ilona Sorrel M.D.   On: 03/25/2016 20:05   US Abdomen Limited Ruq  03/25/2016  CLINICAL DATA:  Lower chest and upper abdominal pain with nausea for 1 day EXAM: US ABDOMEN LIMITED - RIGHT UPPER QUADRANT COMPARISON:  None. FINDINGS: Gallbladder: There is a 1.1 cm calculus adherent in the neck of the gallbladder. The gallbladder wall is mildly thickened. There is no pericholecystic fluid. No sonographic Murphy sign noted by sonographer. Common  bile duct: Diameter: 3 mm. There is no intrahepatic or extrahepatic biliary duct dilatation. Liver: The liver echogenicity is diffusely increased. Near the gallbladder fossa, there is an area of relative decreased attenuation, likely due to fatty sparing measuring 1.7 x 1.2 x 1.2 cm. There is no other evidence of focal liver lesion. IMPRESSION: Calculus adherent in the neck of the gallbladder with wall thickening of the gallbladder. Suspect early acute cholecystitis. Liver echogenicity overall is increased consistent with hepatic steatosis with what appears to be fatty sparing near the gallbladder fossa. The sensitivity of ultrasound for focal liver lesions given this degree of hepatic steatosis is diminished. Electronically Signed   By: Lowella Grip III M.D.   On: 03/25/2016 11:05    Scheduled Meds: . atorvastatin  80 mg Oral q1800  . carvedilol  25 mg Oral BID WC  . cefTRIAXone (ROCEPHIN)  IV  2 g Intravenous Q24H  . enoxaparin (LOVENOX) injection  40 mg Subcutaneous Q24H  . insulin aspart  0-15 Units Subcutaneous TID WC  . insulin aspart  0-5 Units Subcutaneous QHS   Continuous Infusions:   Principal Problem:   Cholecystitis Active Problems:   Non-insulin dependent type 2 diabetes mellitus (HCC)   Hyperlipidemia   GERD   Obesity   CAD (coronary artery disease), native coronary artery   AICD (automatic cardioverter/defibrillator) present   Chronic systolic CHF (congestive heart failure) ([place])   Abdominal pain   Hypoxia   Steatosis, liver    Time spent: 25 min    [facility] Pager [phone]. If 7PM-7AM, please contact night-coverage at [email or web address], password [email or web address] 03/26/2016, 4:06 PM

## 2016-03-26 NOTE — Progress Notes (Signed)
Subjective: Still tender RUQ, no SOB this AM. Says it hurts more in RUQ with deep inspiration.  Objective: Vital signs in last 24 hours: Temp:  [98.3 F (36.8 C)-99.9 F (37.7 C)] 98.5 F (36.9 C) (03/28 0539) Pulse Rate:  [95-116] 101 (03/28 0539) Resp:  [20-32] 20 (03/28 0539) BP: (109-145)/(61-105) 116/71 mmHg (03/28 0539) SpO2:  [88 %-97 %] 94 % (03/28 0539) Weight:  [103.1 kg (227 lb 4.7 oz)] 103.1 kg (227 lb 4.7 oz) (03/28 0102) Last BM Date: 03/24/16 Cardiac diet 500 urine Afebrile, VSS, some tachycardia No labs this AM HIDA done last PM: 1. No radiotracer uptake within the gallbladder after 4 hours of imaging, compatible with cystic duct obstruction and acute cholecystitis in the correct clinical setting. 2. No scintigraphic evidence of hepatocellular dysfunction or common bile duct obstruction. Intake/Output from previous day: 03/27 0701 - 03/28 0700 In: -  Out: 500 [Urine:500] Intake/Output this shift:    General appearance: alert, cooperative and no distress Resp: clear to auscultation bilaterally GI:  Soft, tender RUQ, + BS  Lab Results:   Recent Labs  03/25/16 0630  WBC 7.3  HGB 14.8  HCT 43.8  PLT 175    BMET  Recent Labs  03/25/16 0630  NA 139  K 4.0  CL 106  CO2 20*  GLUCOSE 182*  BUN 14  CREATININE 0.93  CALCIUM 9.4   PT/INR No results for input(s): LABPROT, INR in the last 72 hours.   Recent Labs Lab 03/25/16 0630  AST 28  ALT 30  ALKPHOS 56  BILITOT 1.7*  PROT 6.4*  ALBUMIN 3.7     Lipase  No results found for: LIPASE   Studies/Results: Ct Angio Chest Pe W/cm &/or Wo Cm  03/25/2016  CLINICAL DATA:  Right upper chest pain since yesterday afternoon. EXAM: CT ANGIOGRAPHY CHEST WITH CONTRAST TECHNIQUE: Multidetector CT imaging of the chest was performed using the standard protocol during bolus administration of intravenous contrast. Multiplanar CT image reconstructions and MIPs were obtained to evaluate the vascular  anatomy. CONTRAST:  100 cc Isovue 370 COMPARISON:  None. FINDINGS: Mediastinum/Lymph Nodes: Some of the most peripheral segmental and subsegmental pulmonary arteries cannot be definitively characterize due to mild patient breathing motion artifact. There is no pulmonary embolism identified within the main, lobar or central segmental pulmonary arteries bilaterally. Scattered atherosclerotic changes are seen along the walls of the normal-caliber thoracic aorta. No aortic aneurysm or dissection. Heart size is upper normal. No pericardial effusion. Coronary artery calcifications noted, particularly dense within the left anterior descending coronary artery. Thinning of the left ventricular apex, with associated apical calcifications, indicating a previous infarction. No masses or enlarged lymph nodes within the mediastinum or perihilar regions. Mild prominence of the left thyroid lobe without definite mass. Lungs/Pleura: Lungs are clear. No pneumonia. No pleural effusion. No pneumothorax. Some lung detail again difficult to definitively characterize due to patient breathing motion artifact. Trachea and central bronchi are unremarkable. Upper abdomen: No acute findings. Single stone in the gallbladder but no evidence of acute cholecystitis. Probable fatty infiltration of the liver. Benign fat - containing mass within the left adrenal gland. Bilateral renal cysts are incompletely imaged. Musculoskeletal: No chest wall mass or suspicious bone lesions identified. Mild degenerative spurring within the thoracic spine. Review of the MIP images confirms the above findings. IMPRESSION: 1. No pulmonary embolism seen, with mild study limitations detailed above. 2. Lungs are clear. No pneumonia or pleural effusion. Again, mildly limited characterization due to patient breathing motion artifact.  3. Heart size is upper normal. No pericardial effusion. Coronary artery calcifications, particularly dense within the left anterior  descending coronary artery. Recommend correlation with any possible associated cardiac symptoms. Thinning of the left ventricular apex, with associated calcifications, compatible with a previous MI. 4. Cholelithiasis without evidence of acute cholecystitis. Probable fatty infiltration of the liver. Bilateral renal cysts are incompletely imaged. No acute findings seen within the upper abdomen. 5. Mild prominence of the left thyroid lobe, with inferior retrosternal extension, but without definite thyroid mass. Recommend nonemergent follow-up with nuclear medicine thyroid scan. Electronically Signed   By: Franki Cabot M.D.   On: 03/25/2016 08:51   Nm Hepatobiliary Including Gb  03/25/2016  CLINICAL DATA:  65 year old male with right upper quadrant abdominal pain. Cholelithiasis and gallbladder wall thickening on sonogram from earlier today. EXAM: NUCLEAR MEDICINE HEPATOBILIARY IMAGING TECHNIQUE: Sequential images of the abdomen were obtained out to 60 minutes following intravenous administration of radiopharmaceutical. RADIOPHARMACEUTICALS:  5.2 mCi Tc-11m  Choletec IV COMPARISON:  03/25/2016 right upper quadrant abdominal sonogram and chest CT. FINDINGS: Blood pool clearance and radiotracer concentration by the liver appears normal. There is normal common bile duct and small bowel radiotracer excretion by 10 minutes. No radiotracer uptake was seen in the gallbladder after total of 4 hours of imaging (IV morphine was not administered due to a history of morphine allergy). IMPRESSION: 1. No radiotracer uptake within the gallbladder after 4 hours of imaging, compatible with cystic duct obstruction and acute cholecystitis in the correct clinical setting. 2. No scintigraphic evidence of hepatocellular dysfunction or common bile duct obstruction. These results were called by telephone at the time of interpretation on 03/25/2016 at 7:58 pm to RN Inda Merlin, who verbally acknowledged these results. Electronically Signed    By: Ilona Sorrel M.D.   On: 03/25/2016 20:05   US Abdomen Limited Ruq  03/25/2016  CLINICAL DATA:  Lower chest and upper abdominal pain with nausea for 1 day EXAM: US ABDOMEN LIMITED - RIGHT UPPER QUADRANT COMPARISON:  None. FINDINGS: Gallbladder: There is a 1.1 cm calculus adherent in the neck of the gallbladder. The gallbladder wall is mildly thickened. There is no pericholecystic fluid. No sonographic Murphy sign noted by sonographer. Common bile duct: Diameter: 3 mm. There is no intrahepatic or extrahepatic biliary duct dilatation. Liver: The liver echogenicity is diffusely increased. Near the gallbladder fossa, there is an area of relative decreased attenuation, likely due to fatty sparing measuring 1.7 x 1.2 x 1.2 cm. There is no other evidence of focal liver lesion. IMPRESSION: Calculus adherent in the neck of the gallbladder with wall thickening of the gallbladder. Suspect early acute cholecystitis. Liver echogenicity overall is increased consistent with hepatic steatosis with what appears to be fatty sparing near the gallbladder fossa. The sensitivity of ultrasound for focal liver lesions given this degree of hepatic steatosis is diminished. Electronically Signed   By: Lowella Grip III M.D.   On: 03/25/2016 11:05    Medications: . aspirin EC  81 mg Oral Daily  . atorvastatin  80 mg Oral q1800  . carvedilol  25 mg Oral BID WC  . clopidogrel  75 mg Oral Daily  . enoxaparin (LOVENOX) injection  40 mg Subcutaneous Q24H  . insulin aspart  0-15 Units Subcutaneous TID WC  . insulin aspart  0-5 Units Subcutaneous QHS     Prior to Admission medications   Medication Sig Start Date End Date Taking? Authorizing Provider  acetaminophen (TYLENOL) 500 MG tablet Take 500 mg by mouth  every 6 (six) hours as needed for mild pain or moderate pain.   Yes Historical Provider, MD  aspirin 81 MG tablet Take 81 mg by mouth daily.    Yes Historical Provider, MD  atorvastatin (LIPITOR) 80 MG tablet TAKE 1  TABLET (80 MG TOTAL) BY MOUTH DAILY. 12/11/15  Yes Shaune Pascal Bensimhon, MD  carvedilol (COREG) 25 MG tablet TAKE 1 TABLET (25 MG TOTAL) BY MOUTH 2 (TWO) TIMES DAILY WITH A MEAL. 01/09/16  Yes Abner Greenspan, MD  clopidogrel (PLAVIX) 75 MG tablet TAKE 1 TABLET (75 MG TOTAL) BY MOUTH DAILY. 03/15/16  Yes Shaune Pascal Bensimhon, MD  glipiZIDE (GLUCOTROL XL) 5 MG 24 hr tablet TAKE 1 TABLET (5 MG TOTAL) BY MOUTH DAILY WITH BREAKFAST. 01/12/16  Yes Abner Greenspan, MD  metFORMIN (GLUCOPHAGE) 1000 MG tablet TAKE 1 TABLET (1,000 MG TOTAL) BY MOUTH 2 (TWO) TIMES DAILY WITH A MEAL. 03/15/16  Yes Marne A Tower, MD  NITROSTAT 0.4 MG SL tablet PLACE 1 TABLET (0.4 MG TOTAL) UNDER THE TONGUE EVERY 5 (FIVE) MINUTES AS NEEDED. Patient taking differently: PLACE 1 TABLET (0.4 MG TOTAL) UNDER THE TONGUE EVERY 5 (FIVE) MINUTES AS NEEDED FOR CHEST PAIN 11/01/14  Yes Abner Greenspan, MD  Omega-3 Fatty Acids (FISH OIL) 1000 MG CAPS Take 1 capsule by mouth daily.    Yes Historical Provider, MD  ramipril (ALTACE) 5 MG capsule TAKE 1 CAPSULE (5 MG TOTAL) BY MOUTH DAILY. 03/20/16  Yes Marne A Tower, MD  ranitidine (ZANTAC) 150 MG tablet TAKE 1 TABLET (150 MG TOTAL) BY MOUTH 2 (TWO) TIMES DAILY. 02/23/16  Yes Abner Greenspan, MD  vitamin B-12 (CYANOCOBALAMIN) 1000 MCG tablet Take 1,000 mcg by mouth daily.     Yes Historical Provider, MD  ONE TOUCH ULTRA TEST test strip CHECK BLOOD SUGAR ONCE DAY AS NEEDED FOR DM E11.9 12/27/15   Abner Greenspan, MD     Assessment/Plan Right sided chest/abdominal pain Negative cardiac cath last year same pain Cholelithiasis - Positive HIDA Hypoxia AODM CAD/AICD/CHF - on Plavix at home (last dose 03/24/16) Antibiotics:  None DVT:  Lovenox/SCD  Plan:  Pt denies SOB this AM, pain is still present RUQ, worse with deep inspiration.  I will start Rocephin and discuss timing for surgery with Dr. Hulen Skains. Will defer to Dr. Darrick Meigs on further cardiac evaluation/management before going to surgery.       Earnstine Regal 03/26/2016 [phone]

## 2016-03-26 NOTE — Progress Notes (Signed)
Wife stated pt was "dripping sweat and not acting like himself".  Went into pt's room and pt sitting on the side of the bed diaphoretic and lethargic.  Assisted pt to lay down in bed.  BP - 80/53, HR - 68, CBG - 150.  MD notified and new order received.  Information passed along to next shift RN and bolus started.  Eliezer Bottom Hayden

## 2016-03-27 ENCOUNTER — Observation Stay (HOSPITAL_COMMUNITY): Payer: 59

## 2016-03-27 ENCOUNTER — Encounter (HOSPITAL_COMMUNITY)
Admission: EM | Disposition: A | Discharge status: Home / Self Care | Payer: Self-pay | Source: Home / Self Care | Attending: Family Medicine

## 2016-03-27 ENCOUNTER — Observation Stay (HOSPITAL_COMMUNITY): Payer: 59 | Admitting: Certified Registered Nurse Anesthetist

## 2016-03-27 ENCOUNTER — Encounter (HOSPITAL_COMMUNITY): Payer: Self-pay | Admitting: Certified Registered Nurse Anesthetist

## 2016-03-27 DIAGNOSIS — I509 Heart failure, unspecified: Secondary | ICD-10-CM | POA: Diagnosis not present

## 2016-03-27 DIAGNOSIS — K819 Cholecystitis, unspecified: Secondary | ICD-10-CM | POA: Diagnosis not present

## 2016-03-27 DIAGNOSIS — I5022 Chronic systolic (congestive) heart failure: Secondary | ICD-10-CM

## 2016-03-27 DIAGNOSIS — E860 Dehydration: Secondary | ICD-10-CM | POA: Diagnosis not present

## 2016-03-27 DIAGNOSIS — I2510 Atherosclerotic heart disease of native coronary artery without angina pectoris: Secondary | ICD-10-CM | POA: Diagnosis not present

## 2016-03-27 DIAGNOSIS — K219 Gastro-esophageal reflux disease without esophagitis: Secondary | ICD-10-CM | POA: Diagnosis not present

## 2016-03-27 DIAGNOSIS — K8012 Calculus of gallbladder with acute and chronic cholecystitis without obstruction: Secondary | ICD-10-CM | POA: Diagnosis not present

## 2016-03-27 HISTORY — PX: CHOLECYSTECTOMY: SHX55

## 2016-03-27 LAB — CBC
HCT: 41.8 % (ref 39.0–52.0)
HEMOGLOBIN: 14.8 g/dL (ref 13.0–17.0)
MCH: 32.3 pg (ref 26.0–34.0)
MCHC: 35.4 g/dL (ref 30.0–36.0)
MCV: 91.3 fL (ref 78.0–100.0)
Platelets: 97 10*3/uL — ABNORMAL LOW (ref 150–400)
RBC: 4.58 MIL/uL (ref 4.22–5.81)
RDW: 13.2 % (ref 11.5–15.5)
WBC: 6.6 10*3/uL (ref 4.0–10.5)

## 2016-03-27 LAB — COMPREHENSIVE METABOLIC PANEL
ALK PHOS: 42 U/L (ref 38–126)
ALT: 120 U/L — AB (ref 17–63)
ANION GAP: 11 (ref 5–15)
AST: 68 U/L — ABNORMAL HIGH (ref 15–41)
Albumin: 3.1 g/dL — ABNORMAL LOW (ref 3.5–5.0)
BUN: 18 mg/dL (ref 6–20)
CALCIUM: 8.7 mg/dL — AB (ref 8.9–10.3)
CO2: 24 mmol/L (ref 22–32)
CREATININE: 1.13 mg/dL (ref 0.61–1.24)
Chloride: 101 mmol/L (ref 101–111)
Glucose, Bld: 160 mg/dL — ABNORMAL HIGH (ref 65–99)
Potassium: 3.5 mmol/L (ref 3.5–5.1)
SODIUM: 136 mmol/L (ref 135–145)
TOTAL PROTEIN: 6.1 g/dL — AB (ref 6.5–8.1)
Total Bilirubin: 1.5 mg/dL — ABNORMAL HIGH (ref 0.3–1.2)

## 2016-03-27 LAB — APTT: APTT: 31 s (ref 24–37)

## 2016-03-27 LAB — PROTIME-INR
INR: 1.19 (ref 0.00–1.49)
PROTHROMBIN TIME: 15.3 s — AB (ref 11.6–15.2)

## 2016-03-27 LAB — LIPASE, BLOOD: LIPASE: 26 U/L (ref 11–51)

## 2016-03-27 LAB — HEPATITIS PANEL, ACUTE
HCV AB: 0.1 {s_co_ratio} (ref 0.0–0.9)
HEP B S AG: NEGATIVE
Hep A IgM: NEGATIVE
Hep B C IgM: NEGATIVE

## 2016-03-27 LAB — SURGICAL PCR SCREEN
MRSA, PCR: NEGATIVE
Staphylococcus aureus: NEGATIVE

## 2016-03-27 LAB — GLUCOSE, CAPILLARY
GLUCOSE-CAPILLARY: 136 mg/dL — AB (ref 65–99)
GLUCOSE-CAPILLARY: 160 mg/dL — AB (ref 65–99)
GLUCOSE-CAPILLARY: 168 mg/dL — AB (ref 65–99)
Glucose-Capillary: 140 mg/dL — ABNORMAL HIGH (ref 65–99)
Glucose-Capillary: 143 mg/dL — ABNORMAL HIGH (ref 65–99)
Glucose-Capillary: 127 mg/dL — ABNORMAL HIGH (ref 65–99)

## 2016-03-27 LAB — TROPONIN I

## 2016-03-27 SURGERY — LAPAROSCOPIC CHOLECYSTECTOMY WITH INTRAOPERATIVE CHOLANGIOGRAM
Anesthesia: General | Site: Abdomen

## 2016-03-27 MED ORDER — ROCURONIUM BROMIDE 100 MG/10ML IV SOLN
INTRAVENOUS | Status: DC | PRN
  Administered 2016-03-27: 15 mg via INTRAVENOUS
  Administered 2016-03-27: 50 mg via INTRAVENOUS
  Administered 2016-03-27: 20 mg via INTRAVENOUS

## 2016-03-27 MED ORDER — IOPAMIDOL (ISOVUE-300) INJECTION 61%
INTRAVENOUS | Status: AC
  Filled 2016-03-27: qty 50

## 2016-03-27 MED ORDER — SUGAMMADEX SODIUM 200 MG/2ML IV SOLN
INTRAVENOUS | Status: DC | PRN
  Administered 2016-03-27: 200 mg via INTRAVENOUS

## 2016-03-27 MED ORDER — LACTATED RINGERS IV SOLN
INTRAVENOUS | Status: DC
  Administered 2016-03-27 (×2): via INTRAVENOUS

## 2016-03-27 MED ORDER — EPHEDRINE SULFATE 50 MG/ML IJ SOLN
INTRAMUSCULAR | Status: AC
  Filled 2016-03-27: qty 1

## 2016-03-27 MED ORDER — TRAMADOL HCL 50 MG PO TABS
50.0000 mg | ORAL_TABLET | Freq: Four times a day (QID) | ORAL | Status: DC | PRN
  Administered 2016-03-27: 50 mg via ORAL
  Filled 2016-03-27: qty 1

## 2016-03-27 MED ORDER — ONDANSETRON HCL 4 MG/2ML IJ SOLN
INTRAMUSCULAR | Status: AC
  Filled 2016-03-27: qty 2

## 2016-03-27 MED ORDER — FENTANYL CITRATE (PF) 250 MCG/5ML IJ SOLN
INTRAMUSCULAR | Status: AC
  Filled 2016-03-27: qty 5

## 2016-03-27 MED ORDER — POTASSIUM CHLORIDE IN NACL 20-0.9 MEQ/L-% IV SOLN
INTRAVENOUS | Status: DC
  Administered 2016-03-28 (×2): via INTRAVENOUS
  Filled 2016-03-27 (×2): qty 1000

## 2016-03-27 MED ORDER — HYDROMORPHONE HCL 1 MG/ML IJ SOLN: 0.2500 mg | INTRAMUSCULAR | Status: DC | PRN

## 2016-03-27 MED ORDER — ROCURONIUM BROMIDE 50 MG/5ML IV SOLN
INTRAVENOUS | Status: AC
  Filled 2016-03-27: qty 1

## 2016-03-27 MED ORDER — METFORMIN HCL 500 MG PO TABS: 1000.0000 mg | ORAL_TABLET | Freq: Two times a day (BID) | ORAL | Status: DC

## 2016-03-27 MED ORDER — PROPOFOL 10 MG/ML IV BOLUS
INTRAVENOUS | Status: AC
  Filled 2016-03-27: qty 20

## 2016-03-27 MED ORDER — PROPOFOL 10 MG/ML IV BOLUS
INTRAVENOUS | Status: DC | PRN
  Administered 2016-03-27: 120 mg via INTRAVENOUS

## 2016-03-27 MED ORDER — IOPAMIDOL (ISOVUE-300) INJECTION 61%
INTRAVENOUS | Status: DC | PRN
  Administered 2016-03-27: 25 mL

## 2016-03-27 MED ORDER — GLYCOPYRROLATE 0.2 MG/ML IJ SOLN
INTRAMUSCULAR | Status: AC
  Filled 2016-03-27: qty 2

## 2016-03-27 MED ORDER — BUPIVACAINE-EPINEPHRINE 0.25% -1:200000 IJ SOLN
INTRAMUSCULAR | Status: DC | PRN
  Administered 2016-03-27: 10 mL

## 2016-03-27 MED ORDER — BUPIVACAINE-EPINEPHRINE (PF) 0.25% -1:200000 IJ SOLN
INTRAMUSCULAR | Status: AC
  Filled 2016-03-27: qty 30

## 2016-03-27 MED ORDER — 0.9 % SODIUM CHLORIDE (POUR BTL) OPTIME
TOPICAL | Status: DC | PRN
  Administered 2016-03-27: 1000 mL

## 2016-03-27 MED ORDER — SODIUM CHLORIDE 0.9 % IR SOLN
Status: DC | PRN
  Administered 2016-03-27: 1000 mL

## 2016-03-27 MED ORDER — LIDOCAINE HCL (CARDIAC) 20 MG/ML IV SOLN
INTRAVENOUS | Status: AC
  Filled 2016-03-27: qty 5

## 2016-03-27 MED ORDER — SUGAMMADEX SODIUM 200 MG/2ML IV SOLN
INTRAVENOUS | Status: AC
  Filled 2016-03-27: qty 2

## 2016-03-27 MED ORDER — MIDAZOLAM HCL 2 MG/2ML IJ SOLN
INTRAMUSCULAR | Status: AC
  Filled 2016-03-27: qty 2

## 2016-03-27 MED ORDER — HEMOSTATIC AGENTS (NO CHARGE) OPTIME
TOPICAL | Status: DC | PRN
  Administered 2016-03-27: 1 via TOPICAL

## 2016-03-27 MED ORDER — ONDANSETRON HCL 4 MG/2ML IJ SOLN
INTRAMUSCULAR | Status: DC | PRN
  Administered 2016-03-27: 4 mg via INTRAVENOUS

## 2016-03-27 MED ORDER — GLIPIZIDE ER 5 MG PO TB24
5.0000 mg | ORAL_TABLET | Freq: Every day | ORAL | Status: DC
  Administered 2016-03-28 – 2016-03-29 (×2): 5 mg via ORAL
  Filled 2016-03-27 (×2): qty 1

## 2016-03-27 MED ORDER — RAMIPRIL 5 MG PO CAPS
5.0000 mg | ORAL_CAPSULE | Freq: Every day | ORAL | Status: DC
Start: 2016-03-27 — End: 2016-03-28
  Administered 2016-03-27 – 2016-03-28 (×2): 5 mg via ORAL
  Filled 2016-03-27 (×2): qty 1

## 2016-03-27 MED ORDER — FENTANYL CITRATE (PF) 100 MCG/2ML IJ SOLN
INTRAMUSCULAR | Status: DC | PRN
  Administered 2016-03-27 (×2): 100 ug via INTRAVENOUS
  Administered 2016-03-27: 50 ug via INTRAVENOUS
  Administered 2016-03-27: 100 ug via INTRAVENOUS
  Administered 2016-03-27 (×3): 50 ug via INTRAVENOUS

## 2016-03-27 MED ORDER — OXYCODONE-ACETAMINOPHEN 5-325 MG PO TABS
1.0000 | ORAL_TABLET | ORAL | Status: DC | PRN
  Administered 2016-03-27 – 2016-03-28 (×5): 2 via ORAL
  Filled 2016-03-27 (×5): qty 2

## 2016-03-27 MED ORDER — LIDOCAINE HCL (CARDIAC) 20 MG/ML IV SOLN
INTRAVENOUS | Status: DC | PRN
  Administered 2016-03-27: 60 mg via INTRAVENOUS

## 2016-03-27 MED ORDER — EPHEDRINE SULFATE 50 MG/ML IJ SOLN
INTRAMUSCULAR | Status: DC | PRN
  Administered 2016-03-27 (×2): 15 mg via INTRAVENOUS

## 2016-03-27 SURGICAL SUPPLY — 45 items
ADH SKN CLS APL DERMABOND .7 (GAUZE/BANDAGES/DRESSINGS) ×1
APPLIER CLIP 5 13 M/L LIGAMAX5 (MISCELLANEOUS) ×3
APR CLP MED LRG 5 ANG JAW (MISCELLANEOUS) ×1
BAG SPEC RTRVL LRG 6X4 10 (ENDOMECHANICALS) ×1
BLADE SURG ROTATE 9660 (MISCELLANEOUS) ×2 IMPLANT
CANISTER SUCTION 2500CC (MISCELLANEOUS) ×3 IMPLANT
CHLORAPREP W/TINT 26ML (MISCELLANEOUS) ×3 IMPLANT
CLIP APPLIE 5 13 M/L LIGAMAX5 (MISCELLANEOUS) ×1 IMPLANT
CLOSURE WOUND 1/2 X4 (GAUZE/BANDAGES/DRESSINGS) ×1
CONT SPEC 4OZ CLIKSEAL STRL BL (MISCELLANEOUS) ×2 IMPLANT
COVER MAYO STAND STRL (DRAPES) ×2 IMPLANT
COVER SURGICAL LIGHT HANDLE (MISCELLANEOUS) ×3 IMPLANT
DERMABOND ADVANCED (GAUZE/BANDAGES/DRESSINGS) ×2
DERMABOND ADVANCED .7 DNX12 (GAUZE/BANDAGES/DRESSINGS) ×1 IMPLANT
DRAPE C-ARM 42X72 X-RAY (DRAPES) ×2 IMPLANT
DRSG TEGADERM 2-3/8X2-3/4 SM (GAUZE/BANDAGES/DRESSINGS) ×4 IMPLANT
ELECT REM PT RETURN 9FT ADLT (ELECTROSURGICAL) ×3
ELECTRODE REM PT RTRN 9FT ADLT (ELECTROSURGICAL) ×1 IMPLANT
GLOVE BIO SURGEON STRL SZ7 (GLOVE) ×2 IMPLANT
GLOVE BIOGEL PI IND STRL 7.0 (GLOVE) IMPLANT
GLOVE BIOGEL PI IND STRL 7.5 (GLOVE) IMPLANT
GLOVE BIOGEL PI IND STRL 8 (GLOVE) ×1 IMPLANT
GLOVE BIOGEL PI INDICATOR 7.0 (GLOVE) ×2
GLOVE BIOGEL PI INDICATOR 7.5 (GLOVE) ×2
GLOVE BIOGEL PI INDICATOR 8 (GLOVE) ×2
GLOVE ECLIPSE 7.5 STRL STRAW (GLOVE) ×3 IMPLANT
GLOVE SURG SS PI 7.0 STRL IVOR (GLOVE) ×2 IMPLANT
GOWN STRL REUS W/ TWL LRG LVL3 (GOWN DISPOSABLE) ×3 IMPLANT
GOWN STRL REUS W/TWL LRG LVL3 (GOWN DISPOSABLE) ×12
KIT BASIN OR (CUSTOM PROCEDURE TRAY) ×3 IMPLANT
KIT ROOM TURNOVER OR (KITS) ×3 IMPLANT
NS IRRIG 1000ML POUR BTL (IV SOLUTION) ×3 IMPLANT
PAD ARMBOARD 7.5X6 YLW CONV (MISCELLANEOUS) ×3 IMPLANT
POUCH SPECIMEN RETRIEVAL 10MM (ENDOMECHANICALS) ×2 IMPLANT
SCISSORS LAP 5X35 DISP (ENDOMECHANICALS) ×3 IMPLANT
SET CHOLANGIOGRAPH 5 50 .035 (SET/KITS/TRAYS/PACK) ×2 IMPLANT
SET IRRIG TUBING LAPAROSCOPIC (IRRIGATION / IRRIGATOR) ×3 IMPLANT
SLEEVE ENDOPATH XCEL 5M (ENDOMECHANICALS) ×6 IMPLANT
STRIP CLOSURE SKIN 1/2X4 (GAUZE/BANDAGES/DRESSINGS) ×2 IMPLANT
SUT MNCRL AB 4-0 PS2 18 (SUTURE) ×3 IMPLANT
TOWEL OR 17X24 6PK STRL BLUE (TOWEL DISPOSABLE) ×3 IMPLANT
TRAY LAPAROSCOPIC MC (CUSTOM PROCEDURE TRAY) ×3 IMPLANT
TROCAR XCEL BLUNT TIP 100MML (ENDOMECHANICALS) ×3 IMPLANT
TROCAR XCEL NON-BLD 5MMX100MML (ENDOMECHANICALS) ×3 IMPLANT
TUBING INSUFFLATION (TUBING) ×3 IMPLANT

## 2016-03-27 NOTE — Progress Notes (Signed)
Pt has ICD ([employer]) Dr Ermalene Postin called and informed. Encouraged him to call me if I need to call [employer] rep.

## 2016-03-27 NOTE — Anesthesia Preprocedure Evaluation (Addendum)
Anesthesia Evaluation  Patient identified by MRN, date of birth, ID band Patient awake    Reviewed: Allergy & Precautions, NPO status , Patient's Chart, lab work & pertinent test results  Airway Mallampati: II  TM Distance: >3 FB Neck ROM: Full    Dental no notable dental hx.    Pulmonary neg pulmonary ROS, former smoker,    Pulmonary exam normal breath sounds clear to auscultation       Cardiovascular + CAD, + Past MI and +CHF  + Cardiac Defibrillator  Rhythm:Regular Rate:Normal + Systolic murmurs Left ventricle: The cavity size was mildly dilated. Systolic  function was moderately reduced. The estimated ejection fraction  was in the range of 35% to 40%. Dyskinesis and scarring of the  mid-apicalanteroseptal and apical myocardium. Doppler parameters  are consistent with abnormal left ventricular relaxation (grade 1  diastolic dysfunction). Cannot exclude thrombus. - Left atrium: The atrium was mildly dilated.   Neuro/Psych negative neurological ROS  negative psych ROS   GI/Hepatic negative GI ROS, Neg liver ROS,   Endo/Other  diabetes  Renal/GU negative Renal ROS  negative genitourinary   Musculoskeletal negative musculoskeletal ROS (+)   Abdominal   Peds negative pediatric ROS (+)  Hematology negative hematology ROS (+)   Anesthesia Other Findings   Reproductive/Obstetrics negative OB ROS                            Anesthesia Physical Anesthesia Plan  ASA: IV  Anesthesia Plan: General   Post-op Pain Management:    Induction: Intravenous  Airway Management Planned: Oral ETT  Additional Equipment:   Intra-op Plan:   Post-operative Plan: Extubation in OR  Informed Consent: I have reviewed the patients History and Physical, chart, labs and discussed the procedure including the risks, benefits and alternatives for the proposed anesthesia with the patient or authorized  representative who has indicated his/her understanding and acceptance.   Dental advisory given  Plan Discussed with: CRNA and Surgeon  Anesthesia Plan Comments:         Anesthesia Quick Evaluation

## 2016-03-27 NOTE — Progress Notes (Signed)
[facility] PROGRESS NOTE  Harry Payne [id number] DOB: 11-28-1951 DOA: 03/25/2016 PCP: Loura Pardon, MD  Assessment/Plan:   Acute cholecystitis -HIDA scan confirms acute cholecystitis -He has been treated with ceftriaxone -I spoke with general surgery planned for cholecystectomy later on today.  ? Hypoxia- patient has history of systolic congestive heart failure with diastolic heart failure. At this and O2 sats 94% on room air. Will closely monitor the patient after surgery  History of CAD- patient recently had left heart cath on 02/27/2016 which showed nonobstructive CAD, moderate every dysfunction. Cardiology  recommended medical therapy Patient does not have any chest pain at this time. Continue Coreg 25 mg twice a day, Lipitor 80 mg daily  Diabetes mellitus- continue sliding-scale insulin with NovoLog  Hypotension. -Overnight patient found to have a blood pressure of 80/53 which responded to a 500 mL bolus of normal saline. - This morning blood pressures improved at 120/90. Suspect hypotension related to dehydration/fine depletion. -Provide gentle IV fluid hydration  Risk stratification- patient is moderate risk for surgery due to underlying history of CAD   Code Status: Full code Family Communication: No family present at bedside Disposition Plan: 1 for cholecystectomy this evening   Consultants: General surgery   Procedures:  None  Antibiotics: Ceftriaxone  HPI/Subjective: 65 y.o. male with a Past Medical History of CAD, GERD, AICD CHF, MI, HLD, DM who presents with likely acute cholecystitis and hypoxemia from likely undiagnosed COPD. No immediate respiratory distress but pt w/ 92.5 pack year of smoking. Quit in 2003.   He denies chest pain or shortness of breath, reporting feeling ill overnight   Objective: Filed Vitals:   03/26/16 2100 03/27/16 0500  BP: 103/57 120/90  Pulse: 76 96  Temp:  99.1 F (37.3 C)  Resp:  18   No intake or output  data in the 24 hours ending 03/27/16 1712 Filed Weights   03/25/16 1948 03/26/16 0102 03/27/16 0332  Weight: 103.1 kg (227 lb 4.7 oz) 103.1 kg (227 lb 4.7 oz) 99.338 kg (219 lb)    Exam:   General:  Appears in no acute distress  Cardiovascular: S1-S2 regular  Respiratory: Clear to auscultation bilaterally  Abdomen: Soft, nontender, no organomegaly  Musculoskeletal: No cyanosis/clubbing/edema of the lower extremities    Data Reviewed: Basic Metabolic Panel:  Recent Labs Lab 03/25/16 0630 03/27/16 0447  NA 139 136  K 4.0 3.5  CL 106 101  CO2 20* 24  GLUCOSE 182* 160*  BUN 14 18  CREATININE 0.93 1.13  CALCIUM 9.4 8.7*   Liver Function Tests:  Recent Labs Lab 03/25/16 0630 03/27/16 0447  AST 28 68*  ALT 30 120*  ALKPHOS 56 42  BILITOT 1.7* 1.5*  PROT 6.4* 6.1*  ALBUMIN 3.7 3.1*    CBC:  Recent Labs Lab 03/25/16 0630 03/27/16 0447  WBC 7.3 6.6  NEUTROABS 6.6  --   HGB 14.8 14.8  HCT 43.8 41.8  MCV 92.0 91.3  PLT 175 97*   Cardiac Enzymes:  Recent Labs Lab 03/25/16 0630 03/25/16 1002 03/27/16 1026  TROPONINI <0.03 <0.03 <0.03   BNP (last 3 results)  Recent Labs  02/25/16 2335  BNP 34.7    ProBNP (last 3 results) No results for input(s): PROBNP in the last 8760 hours.  CBG:  Recent Labs Lab 03/26/16 2151 03/27/16 0746 03/27/16 0938 03/27/16 1209 03/27/16 1430  GLUCAP 107* 140* 143* 127* 136*    Recent Results (from the past 240 hour(s))  Surgical pcr screen  Status: None   Collection Time: 03/26/16  7:41 PM  Result Value Ref Range Status   MRSA, PCR NEGATIVE NEGATIVE Final   Staphylococcus aureus NEGATIVE NEGATIVE Final    Comment:        The Xpert SA Assay (FDA approved for NASAL specimens in patients over 27 years of age), is one component of a comprehensive surveillance program.  Test performance has been validated by [facility] for patients greater than or equal to 36 year old. It is not intended to  diagnose infection nor to guide or monitor treatment.      Studies: Nm Hepatobiliary Including Gb  03/25/2016  CLINICAL DATA:  65 year old male with right upper quadrant abdominal pain. Cholelithiasis and gallbladder wall thickening on sonogram from earlier today. EXAM: NUCLEAR MEDICINE HEPATOBILIARY IMAGING TECHNIQUE: Sequential images of the abdomen were obtained out to 60 minutes following intravenous administration of radiopharmaceutical. RADIOPHARMACEUTICALS:  5.2 mCi Tc-64m  Choletec IV COMPARISON:  03/25/2016 right upper quadrant abdominal sonogram and chest CT. FINDINGS: Blood pool clearance and radiotracer concentration by the liver appears normal. There is normal common bile duct and small bowel radiotracer excretion by 10 minutes. No radiotracer uptake was seen in the gallbladder after total of 4 hours of imaging (IV morphine was not administered due to a history of morphine allergy). IMPRESSION: 1. No radiotracer uptake within the gallbladder after 4 hours of imaging, compatible with cystic duct obstruction and acute cholecystitis in the correct clinical setting. 2. No scintigraphic evidence of hepatocellular dysfunction or common bile duct obstruction. These results were called by telephone at the time of interpretation on 03/25/2016 at 7:58 pm to RN Inda Merlin, who verbally acknowledged these results. Electronically Signed   By: Ilona Sorrel M.D.   On: 03/25/2016 20:05    Scheduled Meds: . [MAR Hold] atorvastatin  80 mg Oral q1800  . [MAR Hold] carvedilol  25 mg Oral BID WC  . [MAR Hold] cefTRIAXone (ROCEPHIN)  IV  2 g Intravenous Q24H  . [MAR Hold] enoxaparin (LOVENOX) injection  40 mg Subcutaneous Q24H  . [MAR Hold] insulin aspart  0-15 Units Subcutaneous TID WC  . [MAR Hold] insulin aspart  0-5 Units Subcutaneous QHS   Continuous Infusions: . 0.9 % NaCl with KCl 20 mEq / L    . lactated ringers 10 mL/hr at 03/27/16 1031    Principal Problem:   Cholecystitis Active  Problems:   Non-insulin dependent type 2 diabetes mellitus ([place])   Hyperlipidemia   GERD   Obesity   CAD (coronary artery disease), native coronary artery   AICD (automatic cardioverter/defibrillator) present   Chronic systolic CHF (congestive heart failure) ([place])   Abdominal pain   Hypoxia   Steatosis, liver    Time spent: 25 min    Kelvin Cellar  [facility] Pager [phone]. If 7PM-7AM, please contact night-coverage at [email or web address], password [facility] 03/27/2016, 5:12 PM

## 2016-03-27 NOTE — Progress Notes (Signed)
Subjective: Pt had episode of AMS, diaphoretic, hypotension last PM of uncertain etiology.  This resolved with fluid bolus. He still looks like he feels bad, pain is "right chest," above GB, he is still tender over GB. More uncomfortable with deep inspiration. He is afebrile, HR 84 supine, 91 sitting, 94 standing  Saturations 91-94% on room air.                  BP 131/78 supine, 111/74 sitting,  117/66 standing  Telem show SR Objective: Vital signs in last 24 hours: Temp:  [97.7 F (36.5 C)-99.1 F (37.3 C)] 99.1 F (37.3 C) (03/29 0500) Pulse Rate:  [68-99] 96 (03/29 0500) Resp:  [18-20] 18 (03/29 0500) BP: (80-125)/(53-90) 120/90 mmHg (03/29 0500) SpO2:  [93 %-95 %] 95 % (03/29 0500) Weight:  [99.338 kg (219 lb)] 99.338 kg (219 lb) (03/29 0332) Last BM Date: 03/24/16   220 Po 220 urine recorded BP up to 120/90 4 AM,  80/53 1900 last PM 4 AM labs:  AST 68, ALT 120, T Bil 1.5, creatinine is 1.13, glucose 160 CBC is normal   Intake/Output from previous day: 03/28 0701 - 03/29 0700 In: 220 [P.O.:220] Out: 200 [Urine:200] Intake/Output this shift:    General appearance: alert, appears stated age and he acts like and reports he feels bad. Resp: clear to auscultation bilaterally GI: soft, tender to palpation RUQ over GB, no chest wall tenderness,   Lab Results:   Recent Labs  03/25/16 0630 03/27/16 0447  WBC 7.3 6.6  HGB 14.8 14.8  HCT 43.8 41.8  PLT 175 97*    BMET  Recent Labs  03/25/16 0630 03/27/16 0447  NA 139 136  K 4.0 3.5  CL 106 101  CO2 20* 24  GLUCOSE 182* 160*  BUN 14 18  CREATININE 0.93 1.13  CALCIUM 9.4 8.7*   PT/INR  Recent Labs  03/27/16 0447  LABPROT 15.3*  INR 1.19     Recent Labs Lab 03/25/16 0630 03/27/16 0447  AST 28 68*  ALT 30 120*  ALKPHOS 56 42  BILITOT 1.7* 1.5*  PROT 6.4* 6.1*  ALBUMIN 3.7 3.1*     Lipase     Component Value Date/Time   LIPASE 26 03/27/2016 0447     Studies/Results: Nm  Hepatobiliary Including Gb  03/25/2016  CLINICAL DATA:  65 year old male with right upper quadrant abdominal pain. Cholelithiasis and gallbladder wall thickening on sonogram from earlier today. EXAM: NUCLEAR MEDICINE HEPATOBILIARY IMAGING TECHNIQUE: Sequential images of the abdomen were obtained out to 60 minutes following intravenous administration of radiopharmaceutical. RADIOPHARMACEUTICALS:  5.2 mCi Tc-55m  Choletec IV COMPARISON:  03/25/2016 right upper quadrant abdominal sonogram and chest CT. FINDINGS: Blood pool clearance and radiotracer concentration by the liver appears normal. There is normal common bile duct and small bowel radiotracer excretion by 10 minutes. No radiotracer uptake was seen in the gallbladder after total of 4 hours of imaging (IV morphine was not administered due to a history of morphine allergy). IMPRESSION: 1. No radiotracer uptake within the gallbladder after 4 hours of imaging, compatible with cystic duct obstruction and acute cholecystitis in the correct clinical setting. 2. No scintigraphic evidence of hepatocellular dysfunction or common bile duct obstruction. These results were called by telephone at the time of interpretation on 03/25/2016 at 7:58 pm to RN Inda Merlin, who verbally acknowledged these results. Electronically Signed   By: Ilona Sorrel M.D.   On: 03/25/2016 20:05   US Abdomen Limited Ruq  03/25/2016  CLINICAL DATA:  Lower chest and upper abdominal pain with nausea for 1 day EXAM: US ABDOMEN LIMITED - RIGHT UPPER QUADRANT COMPARISON:  None. FINDINGS: Gallbladder: There is a 1.1 cm calculus adherent in the neck of the gallbladder. The gallbladder wall is mildly thickened. There is no pericholecystic fluid. No sonographic Murphy sign noted by sonographer. Common bile duct: Diameter: 3 mm. There is no intrahepatic or extrahepatic biliary duct dilatation. Liver: The liver echogenicity is diffusely increased. Near the gallbladder fossa, there is an area of relative  decreased attenuation, likely due to fatty sparing measuring 1.7 x 1.2 x 1.2 cm. There is no other evidence of focal liver lesion. IMPRESSION: Calculus adherent in the neck of the gallbladder with wall thickening of the gallbladder. Suspect early acute cholecystitis. Liver echogenicity overall is increased consistent with hepatic steatosis with what appears to be fatty sparing near the gallbladder fossa. The sensitivity of ultrasound for focal liver lesions given this degree of hepatic steatosis is diminished. Electronically Signed   By: Lowella Grip III M.D.   On: 03/25/2016 11:05    Medications: . atorvastatin  80 mg Oral q1800  . carvedilol  25 mg Oral BID WC  . cefTRIAXone (ROCEPHIN)  IV  2 g Intravenous Q24H  . enoxaparin (LOVENOX) injection  40 mg Subcutaneous Q24H  . insulin aspart  0-15 Units Subcutaneous TID WC  . insulin aspart  0-5 Units Subcutaneous QHS    Assessment/Plan Right sided chest/abdominal pain Negative cardiac cath last year same pain Cholelithiasis - Positive HIDA Hypoxia AODM CAD/AICD/CHF - on Plavix at home (last dose 03/24/16) EF 35-40% Antibiotics: day 2 Rocephin  DVT: Lovenox/SCD   Plan:  I am going to recheck his EKG and add troponin to AM labs already done.  I think this is from his gallbladder, but with his heart history I want to be sure.  I have put a call into Dr. Coralyn Pear also.      Earnstine Regal 03/27/2016 [phone]

## 2016-03-27 NOTE — Progress Notes (Signed)
Patient brought back to floor from PACU. No complications at site. Patient due to void.

## 2016-03-27 NOTE — Progress Notes (Signed)
Placed on tele to monitor pt 

## 2016-03-27 NOTE — Transfer of Care (Signed)
Immediate Anesthesia Transfer of Care Note  Patient: Harry Payne  Procedure(s) Performed: Procedure(s): LAPAROSCOPIC CHOLECYSTECTOMY WITH INTRAOPERATIVE CHOLANGIOGRAM (N/A)  Patient Location: PACU  Anesthesia Type:General  Level of Consciousness: awake, alert , oriented and patient cooperative  Airway & Oxygen Therapy: Patient Spontanous Breathing and Patient connected to nasal cannula oxygen  Post-op Assessment: Report given to RN and Post -op Vital signs reviewed and stable  Post vital signs: Reviewed and stable  Last Vitals:  Filed Vitals:   03/26/16 2100 03/27/16 0500  BP: 103/57 120/90  Pulse: 76 96  Temp:  37.3 C  Resp:  18    Complications: No apparent anesthesia complications

## 2016-03-27 NOTE — Anesthesia Procedure Notes (Signed)
Procedure Name: Intubation Date/Time: 03/27/2016 4:22 PM Performed by: Salli Quarry Deseree Zemaitis Pre-anesthesia Checklist: Patient identified, Emergency Drugs available, Suction available and Patient being monitored Patient Re-evaluated:Patient Re-evaluated prior to inductionOxygen Delivery Method: Circle system utilized Preoxygenation: Pre-oxygenation with 100% oxygen Intubation Type: IV induction Ventilation: Mask ventilation without difficulty and Oral airway inserted - appropriate to patient size Laryngoscope Size: Mac and 4 Grade View: Grade II Tube type: Oral Tube size: 8.0 mm Number of attempts: 1 Airway Equipment and Method: Stylet Placement Confirmation: ETT inserted through vocal cords under direct vision,  positive ETCO2 and breath sounds checked- equal and bilateral Secured at: 23 cm Tube secured with: Tape Dental Injury: Teeth and Oropharynx as per pre-operative assessment

## 2016-03-27 NOTE — Op Note (Signed)
OPERATIVE REPORT  DATE OF OPERATION:  03/27/2016  PATIENT:  Harry Payne  65 y.o. male  PRE-OPERATIVE DIAGNOSIS:  Acute cholecystitis and cholelithiasis  POST-OPERATIVE DIAGNOSIS:  Acute cholecystitis and cholelithiasis  PROCEDURE:  Procedure(s): LAPAROSCOPIC CHOLECYSTECTOMY WITH INTRAOPERATIVE CHOLANGIOGRAM  SURGEON:  Surgeon(s): Judeth Horn, MD Donnie Mesa, MD  ASSISTANT: Georgette Dover, M.D.  ANESTHESIA:   general  EBL: <30 ml  BLOOD ADMINISTERED: none  DRAINS: none   SPECIMEN:  Source of Specimen:  Gallbladder and contents  COUNTS CORRECT:  YES  PROCEDURE DETAILS: The patient was taken to the operating room and placed on the table in the supine position.  After an adequate endotracheal anesthetic was administered, the patient was prepped with ChloroPrep, and then draped in the usual manner exposing the entire abdomen laterally, inferiorly and up  to the costal margins.  After a proper timeout was performed including identifying the patient and the procedure to be performed, a supraumbilical [id number] midline incision was made using a #15 blade.  This was taken down to the fascia which was then incised with a #15 blade.  The edges of the fascia were tented up with Kocher clamps as the preperitoneal space was penetrated with a Kelly clamp into the peritoneum.  Once this was done, a pursestring suture of 0 Vicryl was passed around the fascial opening.  This was subsequently used to secure the [facility] cannula which was passed into the peritoneal cavity.  Once the [facility] cannula was in place, carbon dioxide gas was insufflated into the peritoneal cavity up to a maximal intra-abdominal pressure of 81mm Hg.The laparoscope, with attached camera and light source, was passed into the peritoneal cavity to visualize the direct insertion of two right upper quadrant 68mm cannulas, and a sup-xiphoid 37mm cannula.  Once all cannulas were in place, the dissection was begun.  The gallbladder was very  tense and inflamed.  Grasping it was difficult, but we were able to do so without penetration initially.  As we were dissecting it out the lume was entered revealing empyema of the GB and hydrops.  Two ratcheted graspers were attached to the dome and infundibulum of the gallbladder and retracted towards the anterior abdominal wall and the right upper quadrant.  Using cautery attached to a dissecting forceps, the peritoneum overlaying the triangle of Chalot and the hepatoduodenal triangle was dissected away exposing the cystic duct and the cystic artery.  A critical window was developed between the CBD and the cystic duct The cystic artery was clipped proximally and distally then transected.  A clip was placed on the gallbladder side of the cystic duct, then a cholecystodochotomy made using the laparoscopic scissors.  Through the cholecystodochotomy a Cook catheter was passed to performed a cholangiogram.  The cholangiogram showed good flow into the duodenum, good proximal filling, no intraductal filling defects, no dilatation..  Once the cholangiogram was completed, the [facility] catheter was removed, and the distal cystic duct was clipped multiple times then transected between the clips.  The gallbladder was then dissected out of the hepatic bed without event.  It was retrieved from the abdomen (using an EndoCatch bag) without event.  Once the gallbladder was removed, the bed was inspected for hemostasis.  Once excellent hemostasis was obtained all gas and fluids were aspirated from above the liver, then the cannulas were removed.  A liter of saline was used to irrigate out the gallbladder fossa.  The bed was cauterized for bleeding and SurgiCel Sno was used in the bed. The supraumbilical  incision was closed using the pursestring suture which was in place.  0.25% bupivicaine with epinephrine was injected at all sites.  All 18mm or greater cannula sites were close using a running subcuticular stitch of 4-0  Monocryl.  5.64mm cannula sites were closed with Dermabond only.Steri-Strips and Tagaderm were used to complete the dressings at all sites.  At this point all needle, sponge, and instrument counts were correct.The patient was awakened from anesthesia and taken to the PACU in stable condition.   PATIENT DISPOSITION:  PACU - hemodynamically stable.   Muzammil Latausha Flamm 3/29/20175:28 PM

## 2016-03-27 NOTE — Anesthesia Postprocedure Evaluation (Signed)
Anesthesia Post Note  Patient: Harry Payne  Procedure(s) Performed: Procedure(s) (LRB): LAPAROSCOPIC CHOLECYSTECTOMY WITH INTRAOPERATIVE CHOLANGIOGRAM (N/A)  Patient location during evaluation: PACU Anesthesia Type: General Level of consciousness: awake Pain management: pain level controlled Vital Signs Assessment: post-procedure vital signs reviewed and stable Respiratory status: spontaneous breathing Cardiovascular status: stable Anesthetic complications: no    Last Vitals:  Filed Vitals:   03/26/16 2100 03/27/16 0500  BP: 103/57 120/90  Pulse: 76 96  Temp:  37.3 C  Resp:  18    Last Pain:  Filed Vitals:   03/27/16 1257  PainSc: 0-No pain                 EDWARDS,Kamrin Sibley

## 2016-03-27 NOTE — Progress Notes (Signed)
Shift event note: Notified by RN just after shift change that wife reported that pt was diaphoretic and "not acting like himself". Pt also noted w/ BP of 80/53. 500 cc NS IVF bolus ordered and pt assessed at bedside. At bedside pt noted resting in bed in NAD. He is awake and alert and oriented x 4. He is able to tell me why he is in the hospital. Denies any pain or discomfort now or earlier w/ episode. Skin is cool and dry, however hair is quite moist to suggest previous episode of diaphoresis. CN II-XII appear grossly intact. BBS CTA. 02 sats 98% on R/A, HR-80 and regular w/o M/G R. Abd is mildly distended, soft and nt to palpation w/ BS in all quadrants. Denies N/V/D.  Assessment/Plan: 1. Hypotension: Etiology unclear. Unknown if pt was OOB prior to episode of hypotension, ?orthostasis. BP improved (103/57) after IVF bolus. 2. AMS: Etiology unclear. Assessment unremarkable. Neuro exam completely non-focal. ?sudden drop in BP. Not compelled to obtain any stat imaging at this time. Will continue to monitor closely on telemetry with low threshold to get stat ct if AMS recurs.   Jeryl Columbia, NP-C [facility] Pager [phone]

## 2016-03-28 ENCOUNTER — Encounter (HOSPITAL_COMMUNITY): Payer: Self-pay | Admitting: General Surgery

## 2016-03-28 DIAGNOSIS — I5022 Chronic systolic (congestive) heart failure: Secondary | ICD-10-CM | POA: Diagnosis not present

## 2016-03-28 DIAGNOSIS — E860 Dehydration: Secondary | ICD-10-CM

## 2016-03-28 DIAGNOSIS — K819 Cholecystitis, unspecified: Secondary | ICD-10-CM | POA: Diagnosis not present

## 2016-03-28 LAB — GLUCOSE, CAPILLARY
GLUCOSE-CAPILLARY: 196 mg/dL — AB (ref 65–99)
Glucose-Capillary: 118 mg/dL — ABNORMAL HIGH (ref 65–99)
Glucose-Capillary: 81 mg/dL (ref 65–99)
Glucose-Capillary: 74 mg/dL (ref 65–99)

## 2016-03-28 MED ORDER — SODIUM CHLORIDE 0.9 % IV BOLUS (SEPSIS)
500.0000 mL | Freq: Once | INTRAVENOUS | Status: AC
  Administered 2016-03-28: 500 mL via INTRAVENOUS

## 2016-03-28 NOTE — Progress Notes (Signed)
1 Day Post-Op  Subjective: He feels better than yesterday but pretty sore.  Sipping on some Breeze when I came in.  No flatus, still pretty distended, port sites all look fine. BS hypoactive.  Objective: Vital signs in last 24 hours: Temp:  [97.9 F (36.6 C)-99.2 F (37.3 C)] 98.7 F (37.1 C) (03/30 0438) Pulse Rate:  [85-99] 86 (03/30 0438) Resp:  [17-19] 19 (03/30 0438) BP: (94-138)/(53-83) 121/66 mmHg (03/30 0438) SpO2:  [93 %-97 %] 93 % (03/30 0438) Last BM Date: 03/24/16 360 PO Afebrile, VSS No labs today IOC:  No evidence of choledocholithiasis  Intake/Output from previous day: 03/29 0701 - 03/30 0700 In: 2631 [P.O.:360; I.V.:2271] Out: 320 [Urine:320] Intake/Output this shift:    General appearance: alert, cooperative and no distress Resp: clear to auscultation bilaterally GI: distended, still pretty sore, BS hypoactive.    Lab Results:   Recent Labs  03/27/16 0447  WBC 6.6  HGB 14.8  HCT 41.8  PLT 97*    BMET  Recent Labs  03/27/16 0447  NA 136  K 3.5  CL 101  CO2 24  GLUCOSE 160*  BUN 18  CREATININE 1.13  CALCIUM 8.7*   PT/INR  Recent Labs  03/27/16 0447  LABPROT 15.3*  INR 1.19     Recent Labs Lab 03/25/16 0630 03/27/16 0447  AST 28 68*  ALT 30 120*  ALKPHOS 56 42  BILITOT 1.7* 1.5*  PROT 6.4* 6.1*  ALBUMIN 3.7 3.1*     Lipase     Component Value Date/Time   LIPASE 26 03/27/2016 0447     Studies/Results: Dg Cholangiogram Operative  03/27/2016  CLINICAL DATA:  Laparoscopic cholecystectomy. EXAM: INTRAOPERATIVE CHOLANGIOGRAM FLUOROSCOPY TIME:  15 seconds COMPARISON:  Nuclear medicine HIDA scan - 03/25/2016; abdominal ultrasound - 03/25/2016 FINDINGS: A single spot intraoperative cholangiographic image of the right upper abdominal quadrant during laparoscopic cholecystectomy are provided for review. Surgical clips overlie the expected location of the gallbladder fossa. Contrast injection demonstrates selective cannulation of  the central aspect of the cystic duct. There is passage of contrast through the central aspect of the cystic duct with filling of a non dilated common bile duct. There is passage of contrast though the CBD and into the descending portion of the duodenum. There is minimal reflux of injected contrast into the common hepatic duct and central aspect of the non dilated intrahepatic biliary system. There are no discrete filling defects within the opacified portions of the biliary system to suggest the presence of choledocholithiasis. IMPRESSION: No evidence of choledocholithiasis. Electronically Signed   By: Sandi Mariscal M.D.   On: 03/27/2016 17:17    Medications: . atorvastatin  80 mg Oral q1800  . carvedilol  25 mg Oral BID WC  . cefTRIAXone (ROCEPHIN)  IV  2 g Intravenous Q24H  . enoxaparin (LOVENOX) injection  40 mg Subcutaneous Q24H  . glipiZIDE  5 mg Oral Q breakfast  . insulin aspart  0-15 Units Subcutaneous TID WC  . insulin aspart  0-5 Units Subcutaneous QHS  . ramipril  5 mg Oral Daily    Assessment/Plan Acute cholecystitis and cholelithiasis S/p laparoscopic cholecystectomy/IOC, 03/27/16, DR. Judeth Horn Right sided chest/abdominal pain Negative cardiac cath last year same pain Cholelithiasis - Positive HIDA Hypoxia AODM CAD/AICD/CHF - on Plavix at home (last dose 03/24/16) EF 35-40% Antibiotics: day 3 Rocephin  DVT: Lovenox/SCD    Plan:  I will decrease IV fluids, continue IV antibiotics today. Advance his diet as tolerated.  If he does  well we can aim to send home tomorrow on a 2 more days of oral antibiotics.   Follow up information is in the AVS.   Recheck labs in AM before he goes home.   Earnstine Regal 03/28/2016 [phone]

## 2016-03-28 NOTE — Progress Notes (Signed)
[facility] PROGRESS NOTE  Harry Payne [id number] DOB: 15-Jul-1951 DOA: 03/25/2016 PCP: Loura Pardon, MD  Assessment/Plan:   Acute cholecystitis -HIDA scan confirms acute cholecystitis -He has been treated with ceftriaxone -On 03/27/2016 was taken to the operating room undergoing laparoscopic cholecystectomy. He tolerated procedure well there no immediate complications. -He remains on IV ceftriaxone, anticipate transition to oral antimicrobial therapy in the next 24 hours. Await further recommendations from general surgery.   ? Hypoxia- patient has history of systolic congestive heart failure with diastolic heart failure. At this and O2 sats 94% on room air. Will closely monitor the patient after surgery  History of CAD- patient recently had left heart cath on 02/27/2016 which showed nonobstructive CAD, moderate every dysfunction. Cardiology  recommended medical therapy Patient does not have any chest pain at this time. Continue Coreg 25 mg twice a day, Lipitor 80 mg daily  Diabetes mellitus- continue sliding-scale insulin with NovoLog  Hypotension. -He had another episode of hypotension today with systolic blood pressures in the 80s. I have discontinued his Coreg, and gave 500 mL bolus of normal saline. I suspect he is volume depleted having positive orthostatics on 03/27/2016. -Watch blood pressures over the next 24 hours  History of chronic systolic congestive heart failure -His last transthoracic echocardiogram was performed on 02/26/2016 that revealed an EF of 35-40%. -Due to probable volume depletion and orthostatic hypotension, Lasix has been held -On exam he does not appear volume overloaded. -Will keep a close watch on his volume status since Lasix has been held and he has received several boluses with normal saline  Risk stratification- patient is moderate risk for surgery due to underlying history of CAD   Code Status: Full code Family Communication: No family  present at bedside Disposition Plan: Anticipate discharge home the next 24-48 hours if he remains stable   Consultants: General surgery   Procedures:  None  Antibiotics: Ceftriaxone  HPI/Subjective: 65 y.o. male with a Past Medical History of CAD, GERD, AICD CHF, MI, HLD, DM who presents with likely acute cholecystitis and hypoxemia from likely undiagnosed COPD. No immediate respiratory distress but pt w/ 92.5 pack year of smoking. Quit in 2003.   He has no complaints, just reports feeling generally weak  Objective: Filed Vitals:   03/28/16 1230 03/28/16 1400  BP: 105/64 110/95  Pulse: 78 77  Temp:  97.9 F (36.6 C)  Resp:  12    Intake/Output Summary (Last 24 hours) at 03/28/16 1533 Last data filed at 03/28/16 0947  Gross per 24 hour  Intake   2631 ml  Output    595 ml  Net   2036 ml   Filed Weights   03/26/16 0102 03/27/16 0332 03/28/16 1152  Weight: 103.1 kg (227 lb 4.7 oz) 99.338 kg (219 lb) 101.692 kg (224 lb 3 oz)    Exam:   General:  Appears in no acute distress  Cardiovascular: S1-S2 regular  Respiratory: Clear to auscultation bilaterally  Abdomen: Soft, nontender, no organomegaly  Musculoskeletal: No cyanosis/clubbing/edema of the lower extremities    Data Reviewed: Basic Metabolic Panel:  Recent Labs Lab 03/25/16 0630 03/27/16 0447  NA 139 136  K 4.0 3.5  CL 106 101  CO2 20* 24  GLUCOSE 182* 160*  BUN 14 18  CREATININE 0.93 1.13  CALCIUM 9.4 8.7*   Liver Function Tests:  Recent Labs Lab 03/25/16 0630 03/27/16 0447  AST 28 68*  ALT 30 120*  ALKPHOS 56 42  BILITOT 1.7* 1.5*  PROT  6.4* 6.1*  ALBUMIN 3.7 3.1*    CBC:  Recent Labs Lab 03/25/16 0630 03/27/16 0447  WBC 7.3 6.6  NEUTROABS 6.6  --   HGB 14.8 14.8  HCT 43.8 41.8  MCV 92.0 91.3  PLT 175 97*   Cardiac Enzymes:  Recent Labs Lab 03/25/16 0630 03/25/16 1002 03/27/16 1026  TROPONINI <0.03 <0.03 <0.03   BNP (last 3 results)  Recent Labs   02/25/16 2335  BNP 34.7    ProBNP (last 3 results) No results for input(s): PROBNP in the last 8760 hours.  CBG:  Recent Labs Lab 03/27/16 1430 03/27/16 1825 03/27/16 2219 03/28/16 0745 03/28/16 1249  GLUCAP 136* 160* 168* 118* 196*    Recent Results (from the past 240 hour(s))  Surgical pcr screen     Status: None   Collection Time: 03/26/16  7:41 PM  Result Value Ref Range Status   MRSA, PCR NEGATIVE NEGATIVE Final   Staphylococcus aureus NEGATIVE NEGATIVE Final    Comment:        The Xpert SA Assay (FDA approved for NASAL specimens in patients over 49 years of age), is one component of a comprehensive surveillance program.  Test performance has been validated by [facility] for patients greater than or equal to 60 year old. It is not intended to diagnose infection nor to guide or monitor treatment.      Studies: Dg Cholangiogram Operative  03/27/2016  CLINICAL DATA:  Laparoscopic cholecystectomy. EXAM: INTRAOPERATIVE CHOLANGIOGRAM FLUOROSCOPY TIME:  15 seconds COMPARISON:  Nuclear medicine HIDA scan - 03/25/2016; abdominal ultrasound - 03/25/2016 FINDINGS: A single spot intraoperative cholangiographic image of the right upper abdominal quadrant during laparoscopic cholecystectomy are provided for review. Surgical clips overlie the expected location of the gallbladder fossa. Contrast injection demonstrates selective cannulation of the central aspect of the cystic duct. There is passage of contrast through the central aspect of the cystic duct with filling of a non dilated common bile duct. There is passage of contrast though the CBD and into the descending portion of the duodenum. There is minimal reflux of injected contrast into the common hepatic duct and central aspect of the non dilated intrahepatic biliary system. There are no discrete filling defects within the opacified portions of the biliary system to suggest the presence of choledocholithiasis. IMPRESSION: No  evidence of choledocholithiasis. Electronically Signed   By: Sandi Mariscal M.D.   On: 03/27/2016 17:17    Scheduled Meds: . atorvastatin  80 mg Oral q1800  . cefTRIAXone (ROCEPHIN)  IV  2 g Intravenous Q24H  . enoxaparin (LOVENOX) injection  40 mg Subcutaneous Q24H  . glipiZIDE  5 mg Oral Q breakfast  . insulin aspart  0-15 Units Subcutaneous TID WC  . insulin aspart  0-5 Units Subcutaneous QHS   Continuous Infusions: . 0.9 % NaCl with KCl 20 mEq / L 50 mL/hr at 03/28/16 1253  . lactated ringers 10 mL/hr at 03/27/16 1031    Principal Problem:   Cholecystitis Active Problems:   Non-insulin dependent type 2 diabetes mellitus ([place])   Hyperlipidemia   GERD   Obesity   CAD (coronary artery disease), native coronary artery   AICD (automatic cardioverter/defibrillator) present   Chronic systolic CHF (congestive heart failure) (HCC)   Abdominal pain   Hypoxia   Steatosis, liver    Time spent: 25 min    Kelvin Cellar  [facility] Pager [phone]. If 7PM-7AM, please contact night-coverage at [email or web address], password [facility] 03/28/2016, 3:33 PM

## 2016-03-28 NOTE — Discharge Instructions (Signed)
Laparoscopic Cholecystectomy, Care After °Refer to this sheet in the next few weeks. These instructions provide you with information about caring for yourself after your procedure. Your health care provider may also give you more specific instructions. Your treatment has been planned according to current medical practices, but problems sometimes occur. Call your health care provider if you have any problems or questions after your procedure. °WHAT TO EXPECT AFTER THE PROCEDURE °After your procedure, it is common to have: °· Pain at your incision sites. You will be given pain medicines to control your pain. °· Mild nausea or vomiting. This should improve after the first 24 hours. °· Bloating and possible shoulder pain from the gas that was used during the procedure. This will improve after the first 24 hours. °HOME CARE INSTRUCTIONS °Incision Care °· Follow instructions from your health care provider about how to take care of your incisions. Make sure you: °¨ Wash your hands with soap and water before you change your bandage (dressing). If soap and water are not available, use hand sanitizer. °¨ Change your dressing as told by your health care provider. °¨ Leave stitches (sutures), skin glue, or adhesive strips in place. These skin closures may need to be in place for 2 weeks or longer. If adhesive strip edges start to loosen and curl up, you may trim the loose edges. Do not remove adhesive strips completely unless your health care provider tells you to do that. °· Do not take baths, swim, or use a hot tub until your health care provider approves. Ask your health care provider if you can take showers. You may only be allowed to take sponge baths for bathing. °General Instructions °· Take over-the-counter and prescription medicines only as told by your health care provider. °· Do not drive or operate heavy machinery while taking prescription pain medicine. °· Return to your normal diet as told by your health care  provider. °· Do not lift anything that is heavier than 10 lb (4.5 kg). °· Do not play contact sports for one week or until your health care provider approves. °SEEK MEDICAL CARE IF:  °· You have redness, swelling, or pain at the site of your incision. °· You have fluid, blood, or pus coming from your incision. °· You notice a bad smell coming from your incision area. °· Your surgical incisions break open. °· You have a fever. °SEEK IMMEDIATE MEDICAL CARE IF: °· You develop a rash. °· You have difficulty breathing. °· You have chest pain. °· You have increasing pain in your shoulders (shoulder strap areas). °· You faint or have dizzy episodes while you are standing. °· You have severe pain in your abdomen. °· You have nausea or vomiting that lasts for more than one day. °  °This information is not intended to replace advice given to you by your health care provider. Make sure you discuss any questions you have with your health care provider. °  °Document Released: 12/16/2005 Document Revised: 09/06/2015 Document Reviewed: 07/28/2013 °Elsevier Interactive Patient Education ©2016 [employer] °CCS ______[facility] °LAPAROSCOPIC SURGERY: POST OP INSTRUCTIONS °Always review your discharge instruction sheet given to you by the facility where your surgery was performed. °IF YOU HAVE DISABILITY OR FAMILY LEAVE FORMS, YOU MUST BRING THEM TO THE OFFICE FOR PROCESSING.   °DO NOT GIVE THEM TO YOUR DOCTOR. ° °1. A prescription for pain medication may be given to you upon discharge.  Take your pain medication as prescribed, if needed.  If narcotic   pain medicine is not needed, then you may take acetaminophen (Tylenol) or ibuprofen (Advil) as needed. °2. Take your usually prescribed medications unless otherwise directed. °3. If you need a refill on your pain medication, please contact your pharmacy.  They will contact our office to request authorization. Prescriptions will not be filled after 5pm or on  week-ends. °4. You should follow a light diet the first few days after arrival home, such as soup and crackers, etc.  Be sure to include lots of fluids daily. °5. Most patients will experience some swelling and bruising in the area of the incisions.  Ice packs will help.  Swelling and bruising can take several days to resolve.  °6. It is common to experience some constipation if taking pain medication after surgery.  Increasing fluid intake and taking a stool softener (such as Colace) will usually help or prevent this problem from occurring.  A mild laxative (Milk of Magnesia or Miralax) should be taken according to package instructions if there are no bowel movements after 48 hours. °7. Unless discharge instructions indicate otherwise, you may remove your bandages 24-48 hours after surgery, and you may shower at that time.  You may have steri-strips (small skin tapes) in place directly over the incision.  These strips should be left on the skin for 7-10 days.  If your surgeon used skin glue on the incision, you may shower in 24 hours.  The glue will flake off over the next 2-3 weeks.  Any sutures or staples will be removed at the office during your follow-up visit. °8. ACTIVITIES:  You may resume regular (light) daily activities beginning the next day--such as daily self-care, walking, climbing stairs--gradually increasing activities as tolerated.  You may have sexual intercourse when it is comfortable.  Refrain from any heavy lifting or straining until approved by your doctor. °a. You may drive when you are no longer taking prescription pain medication, you can comfortably wear a seatbelt, and you can safely maneuver your car and apply brakes. °b. RETURN TO WORK:  __________________________________________________________ °9. You should see your doctor in the office for a follow-up appointment approximately 2-3 weeks after your surgery.  Make sure that you call for this appointment within a day or two after you  arrive home to insure a convenient appointment time. °10. OTHER INSTRUCTIONS: __________________________________________________________________________________________________________________________ __________________________________________________________________________________________________________________________ °WHEN TO CALL YOUR DOCTOR: °1. Fever over 101.0 °2. Inability to urinate °3. Continued bleeding from incision. °4. Increased pain, redness, or drainage from the incision. °5. Increasing abdominal pain ° °The clinic staff is available to answer your questions during regular business hours.  Please don’t hesitate to call and ask to speak to one of the nurses for clinical concerns.  If you have a medical emergency, go to the nearest emergency room or call 911.  A surgeon from [facility] is always on call at the hospital. °[address], [address], [place], [place]  [address] ? [address] [address], [place],    [address] °[phone] ? [phone] ? FAX [phone] °Web site: [email or web address] ° °

## 2016-03-29 DIAGNOSIS — K819 Cholecystitis, unspecified: Secondary | ICD-10-CM | POA: Diagnosis not present

## 2016-03-29 DIAGNOSIS — I5022 Chronic systolic (congestive) heart failure: Secondary | ICD-10-CM | POA: Diagnosis not present

## 2016-03-29 LAB — COMPREHENSIVE METABOLIC PANEL
ALBUMIN: 2.7 g/dL — AB (ref 3.5–5.0)
ALK PHOS: 44 U/L (ref 38–126)
ALT: 88 U/L — AB (ref 17–63)
AST: 67 U/L — AB (ref 15–41)
Anion gap: 9 (ref 5–15)
BUN: 17 mg/dL (ref 6–20)
CALCIUM: 8.4 mg/dL — AB (ref 8.9–10.3)
CO2: 24 mmol/L (ref 22–32)
CREATININE: 0.92 mg/dL (ref 0.61–1.24)
Chloride: 103 mmol/L (ref 101–111)
GFR calc Af Amer: >60 mL/min (ref >60)
GFR calc non Af Amer: >60 mL/min (ref >60)
GLUCOSE: 118 mg/dL — AB (ref 65–99)
Potassium: 3.5 mmol/L (ref 3.5–5.1)
SODIUM: 136 mmol/L (ref 135–145)
Total Bilirubin: 1.3 mg/dL — ABNORMAL HIGH (ref 0.3–1.2)
Total Protein: 6.1 g/dL — ABNORMAL LOW (ref 6.5–8.1)

## 2016-03-29 LAB — GLUCOSE, CAPILLARY: Glucose-Capillary: 108 mg/dL — ABNORMAL HIGH (ref 65–99)

## 2016-03-29 LAB — CBC
HEMATOCRIT: 36.6 % — AB (ref 39.0–52.0)
HEMOGLOBIN: 12.7 g/dL — AB (ref 13.0–17.0)
MCH: 31.9 pg (ref 26.0–34.0)
MCHC: 34.7 g/dL (ref 30.0–36.0)
MCV: 92 fL (ref 78.0–100.0)
Platelets: 118 10*3/uL — ABNORMAL LOW (ref 150–400)
RBC: 3.98 MIL/uL — AB (ref 4.22–5.81)
RDW: 13.3 % (ref 11.5–15.5)
WBC: 7.6 10*3/uL (ref 4.0–10.5)

## 2016-03-29 MED ORDER — AMOXICILLIN-POT CLAVULANATE 875-125 MG PO TABS: 1.0000 | ORAL_TABLET | Freq: Two times a day (BID) | ORAL | Status: DC

## 2016-03-29 MED ORDER — CARVEDILOL 12.5 MG PO TABS: 12.5000 mg | ORAL_TABLET | Freq: Two times a day (BID) | ORAL | Status: DC

## 2016-03-29 MED ORDER — TRAMADOL HCL 50 MG PO TABS: 50.0000 mg | ORAL_TABLET | Freq: Four times a day (QID) | ORAL | Status: DC | PRN

## 2016-03-29 NOTE — Discharge Summary (Signed)
Physician Discharge Summary  Harry Payne [id number] DOB: [birth year] DOA: 03/25/2016  PCP: Loura Pardon, MD  Admit date: 03/25/2016 Discharge date: 03/29/2016  Time spent: 35 minutes  Recommendations for Outpatient Follow-up:  1. Please follow up on blood pressures, he was hypotensive during this hospitalization. His Coreg dose was decreased from 25 by mouth twice a day to 12.5 mg by mouth twice a day 2. During this hospitalization he underwent laparoscopic cholecystectomy performed on 03/27/2016. He was discharged on Augmentin 875 tablet by mouth twice a day 3 days as recommended by general surgery. 3. Please follow-up on volume status, he has a history chronic systolic congestive heart failure and was given several boluses of normal saline during this hospitalization for hypotension/dehydration   Discharge Diagnoses:  Principal Problem:   Cholecystitis Active Problems:   Non-insulin dependent type 2 diabetes mellitus ([facility])   Hyperlipidemia   GERD   Obesity   CAD (coronary artery disease), native coronary artery   AICD (automatic cardioverter/defibrillator) present   Chronic systolic CHF (congestive heart failure) (H[facility])   Abdominal pain   Hypoxia   Steatosis, liver   Discharge Condition: Stable  Diet recommendation: Heart healthy  Filed Weights   03/27/16 0332 03/28/16 1152 03/29/16 0523  Weight: 99.338 kg (219 lb) 101.692 kg (224 lb 3 oz) 100.699 kg (222 lb)    History of present illness:  Harry Payne is a 65 y.o. male past medical history that includes CAD nonobstructive status post recent cardiac cath, AICD, CHF, diabetes presents to the emergency department chief complaint right-sided abdominal/chest pain nausea and shortness of breath. Initial evaluation reveals possible acute cholecystitis, intermittent hypoxia.  Information is obtained from the patient and the chart. Patient reports doing well in his usual state of health until last evening he developed sudden  right upper quadrant abdominal pain. He describes the pain is sharp constant rates the pain a 10 out of 10 at worse 7 out of 10 at best. He reports some positions relieve the pain particularly lying on his left side. Associated symptoms include nausea vomiting with mild colored emesis. He denies coffee ground emesis. He also had some shortness of breath. Denies fever chills headache chest pain palpitations syncope or near-syncope. He denies dysuria hematuria frequency or urgency. He denies any diarrhea constipation melena bright red blood per rectum. He denies lower extremity edema or orthopnea.   In the emergency department he is afebrile hemodynamically stable slightly tachycardic mildly hypoxic with tachypnea. In the emergency department he is given 2 nitroglycerin Zofran 4 mg with no relief. Also provided with fentanyl as good relief.  Hospital Course:  Harry Payne is a 65 year old with a past medical history of coronary artery disease, chronic systolic congestive heart failure, admitted to the medicine service on 03/25/2016 when he presented with complaints of abdominal pain located in right upper quadrant. There was clinical suspicion for acute cholecystitis. General surgery was consulted. He was started on IV ceftriaxone. He had a HIDA scan performed on 03/25/2016 that showed findings suggestive of acute cholecystitis. On 03/27/2016 he was taken to the operating room where he underwent laparoscopic cholecystectomy with intraoperative cholangiogram. Cholangiogram did not reveal evidence of choledocholithiasis. Overall he tolerated procedure well with no immediate complications. During this hospitalization he had episodes of hypotension requiring boluses with normal saline. Blood pressures improved with holding anti-hypertensive agents. On discharge his Coreg dose was decreased from 25-12.5 mg by mouth twice a day. Ramipril was discontinued. Please follow-up on his blood pressures on hospital follow-up  visit. He was discharged in stable condition on 03/29/2016, given follow-up appointment with [facility] surgery.   Procedures:  Laparoscopic cholecystectomy performed on 03/27/2016  Consultations:  General surgery  Discharge Exam: Filed Vitals:   03/29/16 0218 03/29/16 0523  BP: 123/78 127/60  Pulse: 83 83  Temp: 98.8 F (37.1 C) 98.5 F (36.9 C)  Resp: 19 18    General: Appears in no acute distress  Cardiovascular: S1-S2 regular  Respiratory: Clear to auscultation bilaterally  Abdomen: Soft, nontender, no organomegaly  Musculoskeletal: No cyanosis/clubbing/edema of the lower extremities  Discharge Instructions    Current Discharge Medication List    START taking these medications   Details  amoxicillin-clavulanate (AUGMENTIN) 875-125 MG tablet Take 1 tablet by mouth 2 (two) times daily. Qty: 6 tablet, Refills: 0    traMADol (ULTRAM) 50 MG tablet Take 1 tablet (50 mg total) by mouth every 6 (six) hours as needed for moderate pain. Qty: 15 tablet, Refills: 0      CONTINUE these medications which have CHANGED   Details  carvedilol (COREG) 12.5 MG tablet Take 1 tablet (12.5 mg total) by mouth 2 (two) times daily with a meal. Qty: 60 tablet, Refills: 0      CONTINUE these medications which have NOT CHANGED   Details  acetaminophen (TYLENOL) 500 MG tablet Take 500 mg by mouth every 6 (six) hours as needed for mild pain or moderate pain.    aspirin 81 MG tablet Take 81 mg by mouth daily.     atorvastatin (LIPITOR) 80 MG tablet TAKE 1 TABLET (80 MG TOTAL) BY MOUTH DAILY. Qty: 30 tablet, Refills: 3    clopidogrel (PLAVIX) 75 MG tablet TAKE 1 TABLET (75 MG TOTAL) BY MOUTH DAILY. Qty: 30 tablet, Refills: 6    glipiZIDE (GLUCOTROL XL) 5 MG 24 hr tablet TAKE 1 TABLET (5 MG TOTAL) BY MOUTH DAILY WITH BREAKFAST. Qty: 90 tablet, Refills: 3    metFORMIN (GLUCOPHAGE) 1000 MG tablet TAKE 1 TABLET (1,000 MG TOTAL) BY MOUTH 2 (TWO) TIMES DAILY WITH A MEAL. Qty:  60 tablet, Refills: 2    NITROSTAT 0.4 MG SL tablet PLACE 1 TABLET (0.4 MG TOTAL) UNDER THE TONGUE EVERY 5 (FIVE) MINUTES AS NEEDED. Qty: 25 tablet, Refills: 4    Omega-3 Fatty Acids (FISH OIL) 1000 MG CAPS Take 1 capsule by mouth daily.     ranitidine (ZANTAC) 150 MG tablet TAKE 1 TABLET (150 MG TOTAL) BY MOUTH 2 (TWO) TIMES DAILY. Qty: 180 tablet, Refills: 1    vitamin B-12 (CYANOCOBALAMIN) 1000 MCG tablet Take 1,000 mcg by mouth daily.      ONE TOUCH ULTRA TEST test strip CHECK BLOOD SUGAR ONCE DAY AS NEEDED FOR DM E11.9 Qty: 100 each, Refills: 1      STOP taking these medications     ramipril (ALTACE) 5 MG capsule        Allergies  Allergen Reactions  . Morphine And Related Other (See Comments)    Tachycardia, goes crazy   Follow-up Information    Follow up with [facility] On 04/16/2016.   Specialty:  General Surgery   Why:  Your appointment is at 1:45 PM, be there 30 minutes early for check in.   Contact information:   [place] [place] [address] [phone]       Follow up with Loura Pardon, MD In 2 weeks.   Specialties:  Family Medicine, Radiology   Contact information:   [address]  [place] [place] [address] [phone]        The results of significant diagnostics from this hospitalization (including imaging, microbiology, ancillary and laboratory) are listed below for reference.    Significant Diagnostic Studies: Dg Cholangiogram Operative  03/27/2016  CLINICAL DATA:  Laparoscopic cholecystectomy. EXAM: INTRAOPERATIVE CHOLANGIOGRAM FLUOROSCOPY TIME:  15 seconds COMPARISON:  Nuclear medicine HIDA scan - 03/25/2016; abdominal ultrasound - 03/25/2016 FINDINGS: A single spot intraoperative cholangiographic image of the right upper abdominal quadrant during laparoscopic cholecystectomy are provided for review. Surgical clips overlie the expected location of the gallbladder fossa. Contrast injection  demonstrates selective cannulation of the central aspect of the cystic duct. There is passage of contrast through the central aspect of the cystic duct with filling of a non dilated common bile duct. There is passage of contrast though the CBD and into the descending portion of the duodenum. There is minimal reflux of injected contrast into the common hepatic duct and central aspect of the non dilated intrahepatic biliary system. There are no discrete filling defects within the opacified portions of the biliary system to suggest the presence of choledocholithiasis. IMPRESSION: No evidence of choledocholithiasis. Electronically Signed   By: Sandi Mariscal M.D.   On: 03/27/2016 17:17   Ct Angio Chest Pe W/cm &/or Wo Cm  03/25/2016  CLINICAL DATA:  Right upper chest pain since yesterday afternoon. EXAM: CT ANGIOGRAPHY CHEST WITH CONTRAST TECHNIQUE: Multidetector CT imaging of the chest was performed using the standard protocol during bolus administration of intravenous contrast. Multiplanar CT image reconstructions and MIPs were obtained to evaluate the vascular anatomy. CONTRAST:  100 cc Isovue 370 COMPARISON:  None. FINDINGS: Mediastinum/Lymph Nodes: Some of the most peripheral segmental and subsegmental pulmonary arteries cannot be definitively characterize due to mild patient breathing motion artifact. There is no pulmonary embolism identified within the main, lobar or central segmental pulmonary arteries bilaterally. Scattered atherosclerotic changes are seen along the walls of the normal-caliber thoracic aorta. No aortic aneurysm or dissection. Heart size is upper normal. No pericardial effusion. Coronary artery calcifications noted, particularly dense within the left anterior descending coronary artery. Thinning of the left ventricular apex, with associated apical calcifications, indicating a previous infarction. No masses or enlarged lymph nodes within the mediastinum or perihilar regions. Mild prominence of  the left thyroid lobe without definite mass. Lungs/Pleura: Lungs are clear. No pneumonia. No pleural effusion. No pneumothorax. Some lung detail again difficult to definitively characterize due to patient breathing motion artifact. Trachea and central bronchi are unremarkable. Upper abdomen: No acute findings. Single stone in the gallbladder but no evidence of acute cholecystitis. Probable fatty infiltration of the liver. Benign fat - containing mass within the left adrenal gland. Bilateral renal cysts are incompletely imaged. Musculoskeletal: No chest wall mass or suspicious bone lesions identified. Mild degenerative spurring within the thoracic spine. Review of the MIP images confirms the above findings. IMPRESSION: 1. No pulmonary embolism seen, with mild study limitations detailed above. 2. Lungs are clear. No pneumonia or pleural effusion. Again, mildly limited characterization due to patient breathing motion artifact. 3. Heart size is upper normal. No pericardial effusion. Coronary artery calcifications, particularly dense within the left anterior descending coronary artery. Recommend correlation with any possible associated cardiac symptoms. Thinning of the left ventricular apex, with associated calcifications, compatible with a previous MI. 4. Cholelithiasis without evidence of acute cholecystitis. Probable fatty infiltration of the liver. Bilateral renal cysts are incompletely imaged. No acute findings seen within the upper abdomen. 5. Mild prominence  of the left thyroid lobe, with inferior retrosternal extension, but without definite thyroid mass. Recommend nonemergent follow-up with nuclear medicine thyroid scan. Electronically Signed   By: Franki Cabot M.D.   On: 03/25/2016 08:51   Nm Hepatobiliary Including Gb  03/25/2016  CLINICAL DATA:  65 year old male with right upper quadrant abdominal pain. Cholelithiasis and gallbladder wall thickening on sonogram from earlier today. EXAM: NUCLEAR MEDICINE  HEPATOBILIARY IMAGING TECHNIQUE: Sequential images of the abdomen were obtained out to 60 minutes following intravenous administration of radiopharmaceutical. RADIOPHARMACEUTICALS:  5.2 mCi Tc-83m  Choletec IV COMPARISON:  03/25/2016 right upper quadrant abdominal sonogram and chest CT. FINDINGS: Blood pool clearance and radiotracer concentration by the liver appears normal. There is normal common bile duct and small bowel radiotracer excretion by 10 minutes. No radiotracer uptake was seen in the gallbladder after total of 4 hours of imaging (IV morphine was not administered due to a history of morphine allergy). IMPRESSION: 1. No radiotracer uptake within the gallbladder after 4 hours of imaging, compatible with cystic duct obstruction and acute cholecystitis in the correct clinical setting. 2. No scintigraphic evidence of hepatocellular dysfunction or common bile duct obstruction. These results were called by telephone at the time of interpretation on 03/25/2016 at 7:58 pm to RN Inda Merlin, who verbally acknowledged these results. Electronically Signed   By: Ilona Sorrel M.D.   On: 03/25/2016 20:05   US Abdomen Limited Ruq  03/25/2016  CLINICAL DATA:  Lower chest and upper abdominal pain with nausea for 1 day EXAM: US ABDOMEN LIMITED - RIGHT UPPER QUADRANT COMPARISON:  None. FINDINGS: Gallbladder: There is a 1.1 cm calculus adherent in the neck of the gallbladder. The gallbladder wall is mildly thickened. There is no pericholecystic fluid. No sonographic Murphy sign noted by sonographer. Common bile duct: Diameter: 3 mm. There is no intrahepatic or extrahepatic biliary duct dilatation. Liver: The liver echogenicity is diffusely increased. Near the gallbladder fossa, there is an area of relative decreased attenuation, likely due to fatty sparing measuring 1.7 x 1.2 x 1.2 cm. There is no other evidence of focal liver lesion. IMPRESSION: Calculus adherent in the neck of the gallbladder with wall thickening of the  gallbladder. Suspect early acute cholecystitis. Liver echogenicity overall is increased consistent with hepatic steatosis with what appears to be fatty sparing near the gallbladder fossa. The sensitivity of ultrasound for focal liver lesions given this degree of hepatic steatosis is diminished. Electronically Signed   By: Lowella Grip III M.D.   On: 03/25/2016 11:05    Microbiology: Recent Results (from the past 240 hour(s))  Surgical pcr screen     Status: None   Collection Time: 03/26/16  7:41 PM  Result Value Ref Range Status   MRSA, PCR NEGATIVE NEGATIVE Final   Staphylococcus aureus NEGATIVE NEGATIVE Final    Comment:        The Xpert SA Assay (FDA approved for NASAL specimens in patients over 82 years of age), is one component of a comprehensive surveillance program.  Test performance has been validated by [facility] for patients greater than or equal to 70 year old. It is not intended to diagnose infection nor to guide or monitor treatment.      Labs: Basic Metabolic Panel:  Recent Labs Lab 03/25/16 0630 03/27/16 0447 03/29/16 0548  NA 139 136 136  K 4.0 3.5 3.5  CL 106 101 103  CO2 20* 24 24  GLUCOSE 182* 160* 118*  BUN 14 18 17   CREATININE 0.93 1.13  0.92  CALCIUM 9.4 8.7* 8.4*   Liver Function Tests:  Recent Labs Lab 03/25/16 0630 03/27/16 0447 03/29/16 0548  AST 28 68* 67*  ALT 30 120* 88*  ALKPHOS 56 42 44  BILITOT 1.7* 1.5* 1.3*  PROT 6.4* 6.1* 6.1*  ALBUMIN 3.7 3.1* 2.7*    Recent Labs Lab 03/27/16 0447  LIPASE 26   No results for input(s): AMMONIA in the last 168 hours. CBC:  Recent Labs Lab 03/25/16 0630 03/27/16 0447 03/29/16 0548  WBC 7.3 6.6 7.6  NEUTROABS 6.6  --   --   HGB 14.8 14.8 12.7*  HCT 43.8 41.8 36.6*  MCV 92.0 91.3 92.0  PLT 175 97* 118*   Cardiac Enzymes:  Recent Labs Lab 03/25/16 0630 03/25/16 1002 03/27/16 1026  TROPONINI <0.03 <0.03 <0.03   BNP: BNP (last 3 results)  Recent Labs   02/25/16 2335  BNP 34.7    ProBNP (last 3 results) No results for input(s): PROBNP in the last 8760 hours.  CBG:  Recent Labs Lab 03/28/16 0745 03/28/16 1249 03/28/16 1652 03/28/16 2128 03/29/16 0735  GLUCAP 118* 196* 81 74 108*       Signed:  Kelvin Cellar MD.  [facility] 03/29/2016, 10:46 AM

## 2016-03-29 NOTE — Progress Notes (Signed)
2 Days Post-Op  Subjective: He is doing much better this AM.  Sites all look fine.  Much less tender.    Objective: Vital signs in last 24 hours: Temp:  [97.9 F (36.6 C)-98.8 F (37.1 C)] 98.5 F (36.9 C) (03/31 0523) Pulse Rate:  [77-86] 83 (03/31 0523) Resp:  [12-19] 18 (03/31 0523) BP: (86-127)/(54-95) 127/60 mmHg (03/31 0523) SpO2:  [94 %-95 %] 95 % (03/31 0523) Weight:  [100.699 kg (222 lb)-101.692 kg (224 lb 3 oz)] 100.699 kg (222 lb) (03/31 0523) Last BM Date: 03/24/16 600 PO Urine 875   Afebrile, VSS LFT's improving, WBC is normal, H/H is down, and platelets remain low, but better. Intake/Output from previous day: 03/30 0701 - 03/31 0700 In: [id number] [P.O.:600; I.V.:1090] Out: [id number] [Urine:875] Intake/Output this shift:    General appearance: alert, cooperative and no distress GI: soft, sore, sites all look fine.  Lab Results:   Recent Labs  03/27/16 0447 03/29/16 0548  WBC 6.6 7.6  HGB 14.8 12.7*  HCT 41.8 36.6*  PLT 97* 118*    BMET  Recent Labs  03/27/16 0447 03/29/16 0548  NA 136 136  K 3.5 3.5  CL 101 103  CO2 24 24  GLUCOSE 160* 118*  BUN 18 17  CREATININE 1.13 0.92  CALCIUM 8.7* 8.4*   PT/INR  Recent Labs  03/27/16 0447  LABPROT 15.3*  INR 1.19     Recent Labs Lab 03/25/16 0630 03/27/16 0447 03/29/16 0548  AST 28 68* 67*  ALT 30 120* 88*  ALKPHOS 56 42 44  BILITOT 1.7* 1.5* 1.3*  PROT 6.4* 6.1* 6.1*  ALBUMIN 3.7 3.1* 2.7*     Lipase     Component Value Date/Time   LIPASE 26 03/27/2016 0447     Studies/Results: Dg Cholangiogram Operative  03/27/2016  CLINICAL DATA:  Laparoscopic cholecystectomy. EXAM: INTRAOPERATIVE CHOLANGIOGRAM FLUOROSCOPY TIME:  15 seconds COMPARISON:  Nuclear medicine HIDA scan - 03/25/2016; abdominal ultrasound - 03/25/2016 FINDINGS: A single spot intraoperative cholangiographic image of the right upper abdominal quadrant during laparoscopic cholecystectomy are provided for review. Surgical clips  overlie the expected location of the gallbladder fossa. Contrast injection demonstrates selective cannulation of the central aspect of the cystic duct. There is passage of contrast through the central aspect of the cystic duct with filling of a non dilated common bile duct. There is passage of contrast though the CBD and into the descending portion of the duodenum. There is minimal reflux of injected contrast into the common hepatic duct and central aspect of the non dilated intrahepatic biliary system. There are no discrete filling defects within the opacified portions of the biliary system to suggest the presence of choledocholithiasis. IMPRESSION: No evidence of choledocholithiasis. Electronically Signed   By: Sandi Mariscal M.D.   On: 03/27/2016 17:17    Medications: . atorvastatin  80 mg Oral q1800  . cefTRIAXone (ROCEPHIN)  IV  2 g Intravenous Q24H  . enoxaparin (LOVENOX) injection  40 mg Subcutaneous Q24H  . glipiZIDE  5 mg Oral Q breakfast  . insulin aspart  0-15 Units Subcutaneous TID WC  . insulin aspart  0-5 Units Subcutaneous QHS    Assessment/Plan Acute cholecystitis and cholelithiasis S/p laparoscopic cholecystectomy/IOC, 03/27/16, DR. Judeth Horn Right sided chest/abdominal pain Negative cardiac cath last year same pain Cholelithiasis - Positive HIDA Hypoxia AODM CAD/AICD/CHF - on Plavix at home (last dose 03/24/16) EF 35-40% Antibiotics: day 4 Rocephin  DVT: Lovenox/SCD     Plan:  I would let him  get the AM Rocephin dose and send him home on another 48 hours of Augmentin.  He has follow up info for his cholecystectomy in the [facility].  He says all he will take for pain is Tylenol.       Earnstine Regal 03/29/2016 [phone]

## 2016-04-01 ENCOUNTER — Telehealth: Payer: Self-pay | Admitting: *Deleted

## 2016-04-01 NOTE — Telephone Encounter (Signed)
Transitional care call attempted.  Left message for patient to return call. 

## 2016-04-02 ENCOUNTER — Ambulatory Visit (INDEPENDENT_AMBULATORY_CARE_PROVIDER_SITE_OTHER): Payer: 59 | Admitting: *Deleted

## 2016-04-02 DIAGNOSIS — I255 Ischemic cardiomyopathy: Secondary | ICD-10-CM

## 2016-04-02 NOTE — Progress Notes (Signed)
Remote ICD transmission.   

## 2016-04-02 NOTE — Telephone Encounter (Signed)
Transition Care Management Follow-up Telephone Call   Date discharged? 03/29/16   How have you been since you were released from the hospital? Doing well.   Do you understand why you were in the hospital? yes   Do you understand the discharge instructions? yes   Where were you discharged to? home   Items Reviewed:  Medications reviewed: yes  Allergies reviewed: yes  Dietary changes reviewed: no  Referrals reviewed: general surgery 4/18   Functional Questionnaire:   Activities of Daily Living (ADLs):   He states they are independent in the following: ambulation, bathing and hygiene, feeding, continence, grooming, toileting and dressing States they require assistance with the following: none   Any transportation issues/concerns?: no   Any patient concerns? yes, changes to blood pressure medications - will discuss at f/u   Confirmed importance and date/time of follow-up visits scheduled yes 04/03/16 @ 1000  Provider Appointment booked with Loura Pardon, MD  Confirmed with patient if condition begins to worsen call PCP or go to the ER.  Patient was given the office number and encouraged to call back with question or concerns.  : yes

## 2016-04-03 ENCOUNTER — Encounter: Payer: Self-pay | Admitting: Family Medicine

## 2016-04-03 ENCOUNTER — Ambulatory Visit (INDEPENDENT_AMBULATORY_CARE_PROVIDER_SITE_OTHER): Payer: 59 | Admitting: Family Medicine

## 2016-04-03 VITALS — BP 110/60 | HR 77 | Temp 97.9°F | Ht 71.0 in | Wt 218.2 lb

## 2016-04-03 DIAGNOSIS — I998 Other disorder of circulatory system: Secondary | ICD-10-CM

## 2016-04-03 DIAGNOSIS — K819 Cholecystitis, unspecified: Secondary | ICD-10-CM | POA: Diagnosis not present

## 2016-04-03 DIAGNOSIS — I5022 Chronic systolic (congestive) heart failure: Secondary | ICD-10-CM

## 2016-04-03 DIAGNOSIS — E119 Type 2 diabetes mellitus without complications: Secondary | ICD-10-CM | POA: Diagnosis not present

## 2016-04-03 DIAGNOSIS — E669 Obesity, unspecified: Secondary | ICD-10-CM

## 2016-04-03 LAB — COMPREHENSIVE METABOLIC PANEL
ALBUMIN: 3.5 g/dL (ref 3.5–5.2)
ALT: 82 U/L — ABNORMAL HIGH (ref 0–53)
AST: 37 U/L (ref 0–37)
Alkaline Phosphatase: 62 U/L (ref 39–117)
BUN: 18 mg/dL (ref 6–23)
CALCIUM: 9.6 mg/dL (ref 8.4–10.5)
CO2: 30 meq/L (ref 19–32)
CREATININE: 0.83 mg/dL (ref 0.40–1.50)
Chloride: 101 mEq/L (ref 96–112)
GFR: 99.09 mL/min (ref >60.00)
Glucose, Bld: 325 mg/dL — ABNORMAL HIGH (ref 70–99)
POTASSIUM: 4.2 meq/L (ref 3.5–5.1)
SODIUM: 138 meq/L (ref 135–145)
Total Bilirubin: 0.7 mg/dL (ref 0.2–1.2)
Total Protein: 6.8 g/dL (ref 6.0–8.3)

## 2016-04-03 NOTE — Assessment & Plan Note (Signed)
Well controlled currently Lab Results  Component Value Date   HGBA1C 6.6* 03/25/2016

## 2016-04-03 NOTE — Progress Notes (Signed)
Subjective:    Patient ID: Harry Payne, male    DOB: 03-22-1951, 65 y.o.   MRN: [id number]  HPI Here for f/u of hosp from 3/27-3/31/17 for cholecystisis  Had [facility] with interop cholangiogram  Had several episodes of hypotension in hospital req IVF  Watching volume status in light of known CHF Coreg was dec to 12.5 mg bid -- but he did not do it (because he did not have any more episodes) He continued the 25 mg bid   He thinks something went wrong in the hospital - he was very unhappy with his hospital care - at [facility]    Was d/c with 3 d of augmentin     Chemistry      Component Value Date/Time   NA 136 03/29/2016 0548   K 3.5 03/29/2016 0548   CL 103 03/29/2016 0548   CO2 24 03/29/2016 0548   BUN 17 03/29/2016 0548   CREATININE 0.92 03/29/2016 0548      Component Value Date/Time   CALCIUM 8.4* 03/29/2016 0548   ALKPHOS 44 03/29/2016 0548   AST 67* 03/29/2016 0548   ALT 88* 03/29/2016 0548   BILITOT 1.3* 03/29/2016 0548      Wt is down 7 lb today with bm iof 30  BP Readings from Last 3 Encounters:  04/03/16 116/70  03/29/16 127/60  03/04/16 126/68     Has f/u on 4/18 with [facility]  Doing better overall  Not sore  No more cp or abd pain  Eating normally  A little loose stool-not bad   No sob on exertion or swelling   bp is high at home 170s-180s/90s  Here it is very good    Patient Active Problem List   Diagnosis Date Noted  . Abdominal pain 03/25/2016  . Cholecystitis 03/25/2016  . Hypoxia 03/25/2016  . Gallbladder attack 03/25/2016  . Steatosis, liver 03/25/2016  . Cholelithiasis   . Chest pain   . Chronic systolic CHF (congestive heart failure) ([place]) 02/26/2016  . Unstable angina ([place]) 02/26/2016  . Old anterior myocardial infarction 02/26/2016  . CAD (coronary artery disease), native coronary artery   . AICD (automatic cardioverter/defibrillator) present   . Ischemic cardiomyopathy 06/14/2015  . Obesity 03/25/2012  . Non-insulin dependent type  2 diabetes mellitus ([place])   . Hyperlipidemia 02/09/2008  . GERD 02/09/2008   Past Medical History  Diagnosis Date  . CAD (coronary artery disease)   . GERD (gastroesophageal reflux disease)   . Hx of adenomatous colonic polyps   . Hemorrhoids   . AICD (automatic cardioverter/defibrillator) present   . Hypotension   . CHF (congestive heart failure) ([place])   . Anterior myocardial infarction ([place]) 2004 X 2  . Hyperlipidemia   . Type II diabetes mellitus [facility])    Past Surgical History  Procedure Laterality Date  . Closed reduction hand fracture Left 1990  . Ep implantable device N/A 06/14/2015    Procedure: ICD Implant;  Surgeon: Deboraha Sprang, MD;  Location: [place] [facility];  Service: Cardiovascular;  Laterality: N/A;  . Coronary angioplasty with stent placement  2004    "2"  . Cardiac catheterization  2012    "no stent"  . Cardiac catheterization N/A 02/27/2016    Procedure: Left Heart Cath and Coronary Angiography;  Surgeon: Peter M Martinique, MD;  Location: [place] [facility];  Service: Cardiovascular;  Laterality: N/A;  . Cholecystectomy N/A 03/27/2016    Procedure: LAPAROSCOPIC CHOLECYSTECTOMY WITH INTRAOPERATIVE CHOLANGIOGRAM;  Surgeon: Jeneen Rinks  Hulen Skains, MD;  Location: [place];  Service: General;  Laterality: N/A;   Social History  Substance Use Topics  . Smoking status: Former Smoker -- 2.50 packs/day for 37 years    Types: Cigarettes    Quit date: 12/30/2001  . Smokeless tobacco: Never Used  . Alcohol Use: No   Family History  Problem Relation Age of Onset  . Prostate cancer Father   . Diabetes Brother     x3  . Heart disease Brother    Allergies  Allergen Reactions  . Morphine And Related Other (See Comments)    Tachycardia, goes crazy   Current Outpatient Prescriptions on File Prior to Visit  Medication Sig Dispense Refill  . acetaminophen (TYLENOL) 500 MG tablet Take 500 mg by mouth every 6 (six) hours as needed for mild pain or moderate pain.    Marland Kitchen aspirin 81 MG  tablet Take 81 mg by mouth daily.     Marland Kitchen atorvastatin (LIPITOR) 80 MG tablet TAKE 1 TABLET (80 MG TOTAL) BY MOUTH DAILY. 30 tablet 3  . carvedilol (COREG) 12.5 MG tablet Take 1 tablet (12.5 mg total) by mouth 2 (two) times daily with a meal. 60 tablet 0  . clopidogrel (PLAVIX) 75 MG tablet TAKE 1 TABLET (75 MG TOTAL) BY MOUTH DAILY. 30 tablet 6  . glipiZIDE (GLUCOTROL XL) 5 MG 24 hr tablet TAKE 1 TABLET (5 MG TOTAL) BY MOUTH DAILY WITH BREAKFAST. 90 tablet 3  . metFORMIN (GLUCOPHAGE) 1000 MG tablet TAKE 1 TABLET (1,000 MG TOTAL) BY MOUTH 2 (TWO) TIMES DAILY WITH A MEAL. 60 tablet 2  . NITROSTAT 0.4 MG SL tablet PLACE 1 TABLET (0.4 MG TOTAL) UNDER THE TONGUE EVERY 5 (FIVE) MINUTES AS NEEDED. (Patient taking differently: PLACE 1 TABLET (0.4 MG TOTAL) UNDER THE TONGUE EVERY 5 (FIVE) MINUTES AS NEEDED FOR CHEST PAIN) 25 tablet 4  . Omega-3 Fatty Acids (FISH OIL) 1000 MG CAPS Take 1 capsule by mouth daily.     . ONE TOUCH ULTRA TEST test strip CHECK BLOOD SUGAR ONCE DAY AS NEEDED FOR DM E11.9 100 each 1  . ranitidine (ZANTAC) 150 MG tablet TAKE 1 TABLET (150 MG TOTAL) BY MOUTH 2 (TWO) TIMES DAILY. 180 tablet 1  . traMADol (ULTRAM) 50 MG tablet Take 1 tablet (50 mg total) by mouth every 6 (six) hours as needed for moderate pain. 15 tablet 0  . vitamin B-12 (CYANOCOBALAMIN) 1000 MCG tablet Take 1,000 mcg by mouth daily.      . [DISCONTINUED] pantoprazole (PROTONIX) 40 MG tablet Take 1 tablet (40 mg total) by mouth daily. 30 tablet 11   No current facility-administered medications on file prior to visit.    Review of Systems Review of Systems  Constitutional: Negative for fever, appetite change,  and unexpected weight change.  Eyes: Negative for pain and visual disturbance.  Respiratory: Negative for cough and shortness of breath.   Cardiovascular: Negative for cp or palpitations   neg for pedal edema  Gastrointestinal: Negative for nausea,  and constipation. neg for abd pain , pos for mild loose  stool Genitourinary: Negative for urgency and frequency.  Skin: Negative for pallor or rash   Neurological: Negative for weakness, light-headedness, numbness and headaches.  Hematological: Negative for adenopathy. Does not bruise/bleed easily.  Psychiatric/Behavioral: Negative for dysphoric mood. The patient is not nervous/anxious.         Objective:   Physical Exam  Constitutional: He appears well-developed and well-nourished. No distress.  Well appearing   HENT:  Head: Normocephalic and atraumatic.  Mouth/Throat: Oropharynx is clear and moist.  Eyes: Conjunctivae and EOM are normal. Pupils are equal, round, and reactive to light.  Neck: Normal range of motion. Neck supple. No JVD present. Carotid bruit is not present. No thyromegaly present.  Cardiovascular: Normal rate, regular rhythm, normal heart sounds and intact distal pulses.  Exam reveals no gallop.   Pulmonary/Chest: Effort normal and breath sounds normal. No respiratory distress. He has no wheezes. He has no rales.  No crackles  Abdominal: Soft. Bowel sounds are normal. He exhibits no distension, no abdominal bruit and no mass. There is no tenderness. There is no rebound and no guarding.  Well healing laparoscopy scars  No tenderness  Musculoskeletal: He exhibits no edema or tenderness.  Lymphadenopathy:    He has no cervical adenopathy.  Neurological: He is alert. He has normal reflexes.  Skin: Skin is warm and dry. No rash noted.  Psychiatric:  Somewhat agitated when discussing his dissatisfaction with his hospital stay No obv anx or depressed however         Assessment & Plan:   Problem List Items Addressed This Visit      Cardiovascular and Mediastinum   Blood pressure instability - Primary    Per pt - his bp was high in the hospital until he had one low reading and then they adv him to cut his coreg He did not do this because he states his bp has been high at home Fine here BP: 110/60 mmHg   ? If his  cuff is accurate-asked him to bring it here to next visit and also to cardiology  He continues coreg at 25 mg bid  Feeling fine today  Lab today      Relevant Orders   Comprehensive metabolic panel (Completed)   Chronic systolic CHF (congestive heart failure) (HCC) (Chronic)    No symptoms today  Had rec fluids in hospital for surgery as well as low bp - without incident Will f/u with cardiology as planned        Digestive   Cholecystitis    Rev hospital and op records and labs  Pt feels much better and has surgical f/u on 4/18  Lab today for cmet (liver #s were elevated at last check- but he also has hx of fatty liver) Pt has many complaints about his hospital stay - I enc him to reach out if necessary to hosp HR to voice these- but he does not think he will  He may mention them at his cardiology and surgical f/u visits         Endocrine   Non-insulin dependent type 2 diabetes mellitus ([place]) (Chronic)    Well controlled currently Lab Results  Component Value Date   HGBA1C 6.6* 03/25/2016           Other   Obesity (Chronic)    Wt is down 7 lb since hospitalization Commended  Eating lower fat and sugar diet

## 2016-04-03 NOTE — Assessment & Plan Note (Signed)
Wt is down 7 lb since hospitalization Commended  Eating lower fat and sugar diet

## 2016-04-03 NOTE — Patient Instructions (Signed)
Keep a log of your BP readings at home - take it cardiology when you go  Take your machine also BP is great today  Labs today Glad you are feeling better

## 2016-04-03 NOTE — Assessment & Plan Note (Signed)
Per pt - his bp was high in the hospital until he had one low reading and then they adv him to cut his coreg He did not do this because he states his bp has been high at home Fine here BP: 110/60 mmHg   ? If his cuff is accurate-asked him to bring it here to next visit and also to cardiology  He continues coreg at 25 mg bid  Feeling fine today  Lab today

## 2016-04-03 NOTE — Assessment & Plan Note (Signed)
Rev hospital and op records and labs  Pt feels much better and has surgical f/u on 4/18  Lab today for cmet (liver #s were elevated at last check- but he also has hx of fatty liver) Pt has many complaints about his hospital stay - I enc him to reach out if necessary to hosp HR to voice these- but he does not think he will  He may mention them at his cardiology and surgical f/u visits

## 2016-04-03 NOTE — Assessment & Plan Note (Signed)
No symptoms today  Had rec fluids in hospital for surgery as well as low bp - without incident Will f/u with cardiology as planned

## 2016-04-03 NOTE — Progress Notes (Signed)
Pre visit review using our clinic review tool, if applicable. No additional management support is needed unless otherwise documented below in the visit note. 

## 2016-04-08 ENCOUNTER — Encounter: Payer: Self-pay | Admitting: Internal Medicine

## 2016-04-08 ENCOUNTER — Ambulatory Visit (HOSPITAL_COMMUNITY)
Admit: 2016-04-08 | Discharge: 2016-04-08 | Disposition: A | Discharge status: Home / Self Care | Payer: 59 | Source: Ambulatory Visit | Attending: Internal Medicine | Admitting: Internal Medicine

## 2016-04-08 VITALS — BP 98/64 | HR 78 | Resp 18 | Wt 216.8 lb

## 2016-04-08 DIAGNOSIS — Z95810 Presence of automatic (implantable) cardiac defibrillator: Secondary | ICD-10-CM | POA: Insufficient documentation

## 2016-04-08 DIAGNOSIS — I5022 Chronic systolic (congestive) heart failure: Secondary | ICD-10-CM | POA: Diagnosis not present

## 2016-04-08 DIAGNOSIS — Z7984 Long term (current) use of oral hypoglycemic drugs: Secondary | ICD-10-CM | POA: Insufficient documentation

## 2016-04-08 DIAGNOSIS — E785 Hyperlipidemia, unspecified: Secondary | ICD-10-CM | POA: Insufficient documentation

## 2016-04-08 DIAGNOSIS — Z7982 Long term (current) use of aspirin: Secondary | ICD-10-CM | POA: Insufficient documentation

## 2016-04-08 DIAGNOSIS — N529 Male erectile dysfunction, unspecified: Secondary | ICD-10-CM | POA: Diagnosis not present

## 2016-04-08 DIAGNOSIS — Z955 Presence of coronary angioplasty implant and graft: Secondary | ICD-10-CM | POA: Diagnosis not present

## 2016-04-08 DIAGNOSIS — K219 Gastro-esophageal reflux disease without esophagitis: Secondary | ICD-10-CM | POA: Diagnosis not present

## 2016-04-08 DIAGNOSIS — Z7902 Long term (current) use of antithrombotics/antiplatelets: Secondary | ICD-10-CM | POA: Diagnosis not present

## 2016-04-08 DIAGNOSIS — I255 Ischemic cardiomyopathy: Secondary | ICD-10-CM | POA: Diagnosis not present

## 2016-04-08 DIAGNOSIS — I2510 Atherosclerotic heart disease of native coronary artery without angina pectoris: Secondary | ICD-10-CM | POA: Insufficient documentation

## 2016-04-08 DIAGNOSIS — I252 Old myocardial infarction: Secondary | ICD-10-CM | POA: Insufficient documentation

## 2016-04-08 DIAGNOSIS — I110 Hypertensive heart disease with heart failure: Secondary | ICD-10-CM | POA: Diagnosis not present

## 2016-04-08 DIAGNOSIS — Z79899 Other long term (current) drug therapy: Secondary | ICD-10-CM | POA: Insufficient documentation

## 2016-04-08 DIAGNOSIS — E119 Type 2 diabetes mellitus without complications: Secondary | ICD-10-CM | POA: Insufficient documentation

## 2016-04-08 MED ORDER — SPIRONOLACTONE 25 MG PO TABS: 12.5000 mg | ORAL_TABLET | Freq: Every day | ORAL | Status: DC

## 2016-04-08 MED ORDER — CARVEDILOL 25 MG PO TABS: 12.5000 mg | ORAL_TABLET | Freq: Two times a day (BID) | ORAL | Status: DC

## 2016-04-08 MED ORDER — RAMIPRIL 2.5 MG PO CAPS: 2.5000 mg | ORAL_CAPSULE | Freq: Every day | ORAL | Status: DC

## 2016-04-08 NOTE — Patient Instructions (Signed)
DECREASE Coreg (Carvedilol) to 12.5 mg (1/2 tab) twice daily.  DECREASE Altace to 2.5 mg tablet/capsule once daily. New Rx has been sent in for this dose.  START Spirnolactone 12.5 mg (1/2 tab) once daily.  Return in 7-10 days for lab work.  Follow up 6 weeks with Amy Clegg NP-C.  Do the following things EVERYDAY: 1) Weigh yourself in the morning before breakfast. Write it down and keep it in a log. 2) Take your medicines as prescribed 3) Eat low salt foods-Limit salt (sodium) to 2000 mg per day.  4) Stay as active as you can everyday 5) Limit all fluids for the day to less than 2 liters

## 2016-04-08 NOTE — Progress Notes (Signed)
Patient ID: Harry Payne, male   DOB: May 05, 1951, 65 y.o.   MRN: [id number]   PCP: Dr Alba Cory Primary HF: Dr Haroldine Laws   HPI: Harry Payne is a 65 yo male with CAD s/p anterior MI 2004 (s/p stenting LAD and LCX) with EF 45%, DM2, and hyperlipidemia (previously in AIM-HIGH trial).    Admitted 2/17 with CP. Ruled out. Underwent cath which showed stable CAD. Readmitted in March with abd pain. Found to have cholecystitis. Genreral Surgery consulted and underwent cholecystectomy. Had episodes of hypotension with meds held. Coreg was cut back to 12.5 mg twice a day and ramipril was stopped.    He returns for post hospital follow up. After discharge he continued to take coreg 25 twice a day and ramipril 5 mg daily. SBP at home 140-150. Overall feeling ok but very fatigued.  Mild dyspnea with exertion and hills. Denies PND/Orthopnea.  Denies dizziness/CP. Weight at home 211 pounds.  LHC 02/27/2016  Prox Cx lesion, 35% stenosed. LAD stent open  There is moderate left ventricular systolic dysfunction. 1. Nonobstructive CAD 2. Moderate LV dysfunction 3. No significant change since 2012.   ECHO 01/2016: EF 35-40%. Grade IDD.  ECHO 04/2015: EF 30-35%.   Labs 04/03/2016: K 4.2 Creatinine 0.83  Labs 3/16: TC 148 TG 155 HDL 28 LDL 89   ROS: All systems negative except as listed in HPI, PMH and Problem List.  Past Medical History  Diagnosis Date  . CAD (coronary artery disease)   . GERD (gastroesophageal reflux disease)   . Hx of adenomatous colonic polyps   . Hemorrhoids   . AICD (automatic cardioverter/defibrillator) present   . Hypotension   . CHF (congestive heart failure) ([place])   . Anterior myocardial infarction ([place]) 2004 X 2  . Hyperlipidemia   . Type II diabetes mellitus ([place])     Current Outpatient Prescriptions  Medication Sig Dispense Refill  . acetaminophen (TYLENOL) 500 MG tablet Take 500 mg by mouth every 6 (six) hours as needed for mild pain or moderate pain.    Marland Kitchen aspirin  81 MG tablet Take 81 mg by mouth daily.     Marland Kitchen atorvastatin (LIPITOR) 80 MG tablet TAKE 1 TABLET (80 MG TOTAL) BY MOUTH DAILY. 30 tablet 3  . carvedilol (COREG) 25 MG tablet Take 25 mg by mouth 2 (two) times daily with a meal.    . clopidogrel (PLAVIX) 75 MG tablet TAKE 1 TABLET (75 MG TOTAL) BY MOUTH DAILY. 30 tablet 6  . glipiZIDE (GLUCOTROL XL) 5 MG 24 hr tablet TAKE 1 TABLET (5 MG TOTAL) BY MOUTH DAILY WITH BREAKFAST. 90 tablet 3  . metFORMIN (GLUCOPHAGE) 1000 MG tablet TAKE 1 TABLET (1,000 MG TOTAL) BY MOUTH 2 (TWO) TIMES DAILY WITH A MEAL. 60 tablet 2  . NITROSTAT 0.4 MG SL tablet PLACE 1 TABLET (0.4 MG TOTAL) UNDER THE TONGUE EVERY 5 (FIVE) MINUTES AS NEEDED. (Patient taking differently: PLACE 1 TABLET (0.4 MG TOTAL) UNDER THE TONGUE EVERY 5 (FIVE) MINUTES AS NEEDED FOR CHEST PAIN) 25 tablet 4  . Omega-3 Fatty Acids (FISH OIL) 1000 MG CAPS Take 1 capsule by mouth daily.     . ONE TOUCH ULTRA TEST test strip CHECK BLOOD SUGAR ONCE DAY AS NEEDED FOR DM E11.9 100 each 1  . ramipril (ALTACE) 5 MG capsule Take 5 mg by mouth daily.    . ranitidine (ZANTAC) 150 MG tablet TAKE 1 TABLET (150 MG TOTAL) BY MOUTH 2 (TWO) TIMES DAILY. 180 tablet 1  .  traMADol (ULTRAM) 50 MG tablet Take 1 tablet (50 mg total) by mouth every 6 (six) hours as needed for moderate pain. 15 tablet 0  . vitamin B-12 (CYANOCOBALAMIN) 1000 MCG tablet Take 1,000 mcg by mouth daily.      . [DISCONTINUED] pantoprazole (PROTONIX) 40 MG tablet Take 1 tablet (40 mg total) by mouth daily. 30 tablet 11   No current facility-administered medications for this encounter.     PHYSICAL EXAM: Filed Vitals:   04/08/16 1402  BP: 98/64  Pulse: 78  Resp: 18   General:  Well appearing. No resp difficulty HEENT: normal Neck: supple. JVP 7-8. Carotids 2+ bilaterally; no bruits. No lymphadenopathy or thryomegaly appreciated. Cor: PMI normal. Regular rate & rhythm. No rubs, gallops or murmurs. Lungs: clear Abdomen: central obesity. soft,  nontender, nondistended. No hepatosplenomegaly. No bruits or masses. Good bowel sounds. Extremities: no cyanosis, clubbing, rash, edema Neuro: alert & orientedx3, cranial nerves grossly intact. Moves all 4 extremities w/o difficulty. Affect pleasant.   ASSESSMENT & PLAN: 1. CAD s/p previous anterior MI- 2004 s/p stenting LAD and LCX 2. Chronic Systolic Heart Failure- ICM ECHO 35-40%. 3. Hyperlipidemia 4. Erectile dysfunction 5. DM2  6. HTN - low today.   NYHA II. Cut back coreg to 12.5 mg twice a day and ramipril 2.5 mg daily. Add 12.5 mg spiro daily. Instructed to weigh and record daily.   Follow up next week for BMET. Follow up in 3 months.   Amy Clegg NP-C  2:37 PM  Patient seen and examined with Darrick Grinder, NP. We discussed all aspects of the encounter. I agree with the assessment and plan as stated above.    Results of cath reviewed with him. Stable CAD. Has very mild volume overload. Will add spiro. Given fatigue and low BP will cut back carvedilol and ramipril as above. Follow up labs next week. Continue ASA, statin, plavix for CAD.  Bensimhon, Daniel,MD 10:00 PM

## 2016-04-09 ENCOUNTER — Ambulatory Visit: Payer: 59 | Admitting: Internal Medicine

## 2016-04-18 ENCOUNTER — Other Ambulatory Visit (HOSPITAL_COMMUNITY): Payer: Self-pay | Admitting: Internal Medicine

## 2016-05-01 ENCOUNTER — Telehealth: Payer: Self-pay | Admitting: Family Medicine

## 2016-05-01 NOTE — Telephone Encounter (Signed)
Bottle in your inbox

## 2016-05-01 NOTE — Telephone Encounter (Signed)
Mr. Halliwell stopped by the office with a bottle of Prosvent Ultra Blend, Dietary Supplement 60 Sfotgels. He said he bought it for his prostate but since he's currently taking multiple medications, he wants to make sure it will be safe for him to take this as well. He left a bottle up front and wants Dr. Glori Bickers to review it, the ingredients, etc and call him back to let him know if it's safe for him to take. I've left the bottle in Dr. Marliss Coots box up front. Thank you.

## 2016-05-01 NOTE — Telephone Encounter (Signed)
Because its ingredients have not been studied by the FDA-I cannot promise it will not interact with other medicines - but suspect that it will be ok  If it causes any side effects or does not help his symptoms -do not take it any longer

## 2016-05-01 NOTE — Telephone Encounter (Signed)
Left voicemail letting pt know Dr. Tower's recommendations  

## 2016-05-13 LAB — CUP PACEART REMOTE DEVICE CHECK
Brady Statistic RV Percent Paced: 0 %
HighPow Impedance: 80 Ohm
Implantable Lead Implant Date: 20160615
Implantable Lead Location: 753860
Lead Channel Impedance Value: 475 Ohm
Lead Channel Pacing Threshold Amplitude: 0.5 V
Lead Channel Pacing Threshold Pulse Width: 0.4 ms
Lead Channel Setting Pacing Amplitude: 2.5 V
Lead Channel Setting Sensing Sensitivity: 0.3 mV
MDC IDC MSMT BATTERY REMAINING LONGEVITY: 135 mo
MDC IDC MSMT BATTERY VOLTAGE: 3.06 V
MDC IDC MSMT LEADCHNL RV IMPEDANCE VALUE: 418 Ohm
MDC IDC MSMT LEADCHNL RV SENSING INTR AMPL: 30.125 mV
MDC IDC MSMT LEADCHNL RV SENSING INTR AMPL: 30.125 mV
MDC IDC SESS DTM: 20170404041703
MDC IDC SET LEADCHNL RV PACING PULSEWIDTH: 0.4 ms

## 2016-05-14 ENCOUNTER — Encounter: Payer: Self-pay | Admitting: Cardiology

## 2016-05-15 ENCOUNTER — Encounter: Payer: Self-pay | Admitting: Internal Medicine

## 2016-05-21 ENCOUNTER — Ambulatory Visit (HOSPITAL_COMMUNITY)
Admission: RE | Admit: 2016-05-21 | Discharge: 2016-05-21 | Disposition: A | Discharge status: Home / Self Care | Payer: 59 | Source: Ambulatory Visit | Attending: Adult Health | Admitting: Adult Health

## 2016-05-21 VITALS — BP 126/66 | HR 79 | Wt 219.8 lb

## 2016-05-21 DIAGNOSIS — I5022 Chronic systolic (congestive) heart failure: Secondary | ICD-10-CM | POA: Insufficient documentation

## 2016-05-21 DIAGNOSIS — N529 Male erectile dysfunction, unspecified: Secondary | ICD-10-CM | POA: Diagnosis not present

## 2016-05-21 DIAGNOSIS — Z79899 Other long term (current) drug therapy: Secondary | ICD-10-CM | POA: Diagnosis not present

## 2016-05-21 DIAGNOSIS — Z955 Presence of coronary angioplasty implant and graft: Secondary | ICD-10-CM | POA: Diagnosis not present

## 2016-05-21 DIAGNOSIS — I255 Ischemic cardiomyopathy: Secondary | ICD-10-CM

## 2016-05-21 DIAGNOSIS — I252 Old myocardial infarction: Secondary | ICD-10-CM | POA: Insufficient documentation

## 2016-05-21 DIAGNOSIS — Z7982 Long term (current) use of aspirin: Secondary | ICD-10-CM | POA: Diagnosis not present

## 2016-05-21 DIAGNOSIS — I10 Essential (primary) hypertension: Secondary | ICD-10-CM | POA: Diagnosis not present

## 2016-05-21 DIAGNOSIS — K219 Gastro-esophageal reflux disease without esophagitis: Secondary | ICD-10-CM | POA: Diagnosis not present

## 2016-05-21 DIAGNOSIS — E119 Type 2 diabetes mellitus without complications: Secondary | ICD-10-CM | POA: Insufficient documentation

## 2016-05-21 DIAGNOSIS — Z95810 Presence of automatic (implantable) cardiac defibrillator: Secondary | ICD-10-CM | POA: Diagnosis not present

## 2016-05-21 DIAGNOSIS — Z9049 Acquired absence of other specified parts of digestive tract: Secondary | ICD-10-CM | POA: Insufficient documentation

## 2016-05-21 DIAGNOSIS — E785 Hyperlipidemia, unspecified: Secondary | ICD-10-CM | POA: Diagnosis not present

## 2016-05-21 DIAGNOSIS — I2510 Atherosclerotic heart disease of native coronary artery without angina pectoris: Secondary | ICD-10-CM | POA: Diagnosis not present

## 2016-05-21 LAB — BASIC METABOLIC PANEL
ANION GAP: 4 — AB (ref 5–15)
BUN: 14 mg/dL (ref 6–20)
CO2: 27 mmol/L (ref 22–32)
Calcium: 9.4 mg/dL (ref 8.9–10.3)
Chloride: 107 mmol/L (ref 101–111)
Creatinine, Ser: 0.84 mg/dL (ref 0.61–1.24)
GFR calc Af Amer: >60 mL/min (ref >60)
GLUCOSE: 120 mg/dL — AB (ref 65–99)
POTASSIUM: 4.2 mmol/L (ref 3.5–5.1)
Sodium: 138 mmol/L (ref 135–145)

## 2016-05-21 NOTE — Addendum Note (Signed)
Encounter addended by: Kerry Dory, CMA on: 05/21/2016 11:09 AM<BR>     Documentation filed: [employer], Orders

## 2016-05-21 NOTE — Patient Instructions (Signed)
Follow up in 3-4 months

## 2016-05-21 NOTE — Progress Notes (Signed)
Patient ID: Harry Payne, male   DOB: 09-27-1951, 65 y.o.   MRN: [id number]   PCP: Dr Alba Cory Primary HF: Dr Haroldine Laws   HPI: Harry Payne is a 65 yo male with CAD s/p anterior MI 2004 (s/p stenting LAD and LCX) with EF 45%, DM2, and hyperlipidemia (previously in AIM-HIGH trial).    Admitted 2/17 with CP. Ruled out. Underwent cath which showed stable CAD. Readmitted in March with abd pain. Found to have cholecystitis. Genreral Surgery consulted and underwent cholecystectomy. Had episodes of hypotension with meds held. Coreg was cut back to 12.5 mg twice a day and ramipril was stopped.    He returns for post hospital follow up. Last visit carvedilol was cut back to 12. 23m twice a day and 12.5 mg spiro added. Overall feels good. Denies SOB/Orthopnea/PND/CP. No ICD shocks. Taking all medications. Weight at at home 220-225 pounds. Has not been exercising.   LHC 02/27/2016  Prox Cx lesion, 35% stenosed. LAD stent open  There is moderate left ventricular systolic dysfunction. 1. Nonobstructive CAD 2. Moderate LV dysfunction 3. No significant change since 2012.   ECHO 01/2016: EF 35-40%. Grade IDD.  ECHO 04/2015: EF 30-35%.   Labs 04/03/2016: K 4.2 Creatinine 0.83  Labs 3/16: TC 148 TG 155 HDL 28 LDL 89   ROS: All systems negative except as listed in HPI, PMH and Problem List.  Past Medical History  Diagnosis Date  . CAD (coronary artery disease)   . GERD (gastroesophageal reflux disease)   . Hx of adenomatous colonic polyps   . Hemorrhoids   . AICD (automatic cardioverter/defibrillator) present   . Hypotension   . CHF (congestive heart failure) ([place])   . Anterior myocardial infarction ([place]) 2004 X 2  . Hyperlipidemia   . Type II diabetes mellitus ([place])     Current Outpatient Prescriptions  Medication Sig Dispense Refill  . acetaminophen (TYLENOL) 500 MG tablet Take 500 mg by mouth every 6 (six) hours as needed for mild pain or moderate pain.    Marland Kitchen aspirin 81 MG tablet Take 81 mg  by mouth daily.     Marland Kitchen atorvastatin (LIPITOR) 80 MG tablet TAKE 1 TABLET (80 MG TOTAL) BY MOUTH DAILY. 30 tablet 3  . carvedilol (COREG) 25 MG tablet Take 0.5 tablets (12.5 mg total) by mouth 2 (two) times daily with a meal. 330 tablet 6  . clopidogrel (PLAVIX) 75 MG tablet TAKE 1 TABLET (75 MG TOTAL) BY MOUTH DAILY. 30 tablet 6  . glipiZIDE (GLUCOTROL XL) 5 MG 24 hr tablet TAKE 1 TABLET (5 MG TOTAL) BY MOUTH DAILY WITH BREAKFAST. 90 tablet 3  . metFORMIN (GLUCOPHAGE) 1000 MG tablet TAKE 1 TABLET (1,000 MG TOTAL) BY MOUTH 2 (TWO) TIMES DAILY WITH A MEAL. 60 tablet 2  . Omega-3 Fatty Acids (FISH OIL) 1000 MG CAPS Take 1 capsule by mouth daily.     . ONE TOUCH ULTRA TEST test strip CHECK BLOOD SUGAR ONCE DAY AS NEEDED FOR DM E11.9 100 each 1  . ramipril (ALTACE) 2.5 MG capsule Take 1 capsule (2.5 mg total) by mouth daily. 90 capsule 3  . ranitidine (ZANTAC) 150 MG tablet TAKE 1 TABLET (150 MG TOTAL) BY MOUTH 2 (TWO) TIMES DAILY. 180 tablet 1  . spironolactone (ALDACTONE) 25 MG tablet Take 0.5 tablets (12.5 mg total) by mouth daily. 45 tablet 3  . vitamin B-12 (CYANOCOBALAMIN) 1000 MCG tablet Take 1,000 mcg by mouth daily.      Marland Kitchen NITROSTAT 0.4 MG SL tablet  PLACE 1 TABLET (0.4 MG TOTAL) UNDER THE TONGUE EVERY 5 (FIVE) MINUTES AS NEEDED. (Patient not taking: Reported on 05/21/2016) 25 tablet 4  . [DISCONTINUED] pantoprazole (PROTONIX) 40 MG tablet Take 1 tablet (40 mg total) by mouth daily. 30 tablet 11   No current facility-administered medications for this encounter.     PHYSICAL EXAM: Filed Vitals:   05/21/16 1028  BP: 126/66  Pulse: 79  Weight 219 pounds.  General:  Well appearing. No resp difficulty. Ambulated in the clinic without difficulty HEENT: normal Neck: supple. JVP flat. Carotids 2+ bilaterally; no bruits. No lymphadenopathy or thryomegaly appreciated. Cor: PMI normal. Regular rate & rhythm. No rubs, gallops or murmurs. Lungs: clear Abdomen: central obesity. soft, nontender,  nondistended. No hepatosplenomegaly. No bruits or masses. Good bowel sounds. Extremities: no cyanosis, clubbing, rash, edema Neuro: alert & orientedx3, cranial nerves grossly intact. Moves all 4 extremities w/o difficulty. Affect pleasant.   ASSESSMENT & PLAN: 1. CAD s/p previous anterior MI- 2004 s/p stenting LAD and LCX 2. Chronic Systolic Heart Failure- ICM 01/2016 ECHO 35-40%. NYHA II. Volume status stable. Does not need lasix.  Continue carvedilol 12.5 mg twice a day.  Continue ramipril 2.5 mg daily Continue 12.5 mg spiro daily BMET today.  3. Hyperlipidemia- Continue statin 4. Erectile dysfunction 5. DM2 - doing ok.  6. HTN - Stable.    Follow up in 3-4  months.   Amy Clegg NP-C  10:49 AM

## 2016-05-21 NOTE — Progress Notes (Signed)
Advanced Heart Failure Medication Review by a Pharmacist  Does the patient  feel that his/her medications are working for him/her?  yes  Has the patient been experiencing any side effects to the medications prescribed?  no  Does the patient measure his/her own blood pressure or blood glucose at home?  yes   Does the patient have any problems obtaining medications due to transportation or finances?   no  Understanding of regimen: good Understanding of indications: good Potential of compliance: good Patient understands to avoid NSAIDs. Patient understands to avoid decongestants.  Issues to address at subsequent visits: None   Pharmacist comments:  Harry Payne is a pleasant 65 yo M presenting without a medication list but with good recall of his regimen. He reports good compliance with his regimen and did report that he had some dizziness last Saturday but he thinks this was due to his BG dropping from 240 to 90 after taking his glipizide. I recommended that he continue to measure his BG regularly and if this continues to happen that he should notify his PCP. No other medication-related questions or concerns identified at this time.   [name]. [name], PharmD, BCPS, CPP Clinical Pharmacist Pager: [phone] Phone: [phone] 05/21/2016 10:39 AM      Time with patient: 10 minutes Preparation and documentation time: 2 minutes Total time: 12 minutes

## 2016-06-05 ENCOUNTER — Encounter: Payer: Self-pay | Admitting: Internal Medicine

## 2016-06-06 ENCOUNTER — Telehealth: Payer: Self-pay | Admitting: Family Medicine

## 2016-06-06 DIAGNOSIS — Z0000 Encounter for general adult medical examination without abnormal findings: Secondary | ICD-10-CM | POA: Insufficient documentation

## 2016-06-06 DIAGNOSIS — E119 Type 2 diabetes mellitus without complications: Secondary | ICD-10-CM

## 2016-06-06 DIAGNOSIS — Z125 Encounter for screening for malignant neoplasm of prostate: Secondary | ICD-10-CM | POA: Insufficient documentation

## 2016-06-06 NOTE — Telephone Encounter (Signed)
-----   Message from Marchia Bond sent at 06/04/2016  7:43 AM EDT ----- Regarding: Cpx labs Tues 6/13, need orders. Thanks! :-) Please order  future cpx labs for pt's upcoming lab appt. Thanks Aniceto Boss

## 2016-06-11 ENCOUNTER — Other Ambulatory Visit (INDEPENDENT_AMBULATORY_CARE_PROVIDER_SITE_OTHER): Payer: 59

## 2016-06-11 DIAGNOSIS — Z125 Encounter for screening for malignant neoplasm of prostate: Secondary | ICD-10-CM

## 2016-06-11 DIAGNOSIS — Z0000 Encounter for general adult medical examination without abnormal findings: Secondary | ICD-10-CM | POA: Diagnosis not present

## 2016-06-11 DIAGNOSIS — E119 Type 2 diabetes mellitus without complications: Secondary | ICD-10-CM | POA: Diagnosis not present

## 2016-06-11 LAB — LIPID PANEL
CHOLESTEROL: 119 mg/dL (ref 0–200)
HDL: 30.8 mg/dL — AB (ref >39.00)
LDL Cholesterol: 63 mg/dL (ref 0–99)
NONHDL: 88.31
TRIGLYCERIDES: 126 mg/dL (ref 0.0–149.0)
Total CHOL/HDL Ratio: 4
VLDL: 25.2 mg/dL (ref 0.0–40.0)

## 2016-06-11 LAB — CBC WITH DIFFERENTIAL/PLATELET
BASOS PCT: 0.4 % (ref 0.0–3.0)
Basophils Absolute: 0 10*3/uL (ref 0.0–0.1)
EOS PCT: 2.9 % (ref 0.0–5.0)
Eosinophils Absolute: 0.3 10*3/uL (ref 0.0–0.7)
HEMATOCRIT: 46.3 % (ref 39.0–52.0)
HEMOGLOBIN: 15.5 g/dL (ref 13.0–17.0)
Lymphocytes Relative: 24.7 % (ref 12.0–46.0)
Lymphs Abs: 2.4 10*3/uL (ref 0.7–4.0)
MCHC: 33.5 g/dL (ref 30.0–36.0)
MCV: 93.2 fl (ref 78.0–100.0)
MONO ABS: 0.7 10*3/uL (ref 0.1–1.0)
Monocytes Relative: 7.4 % (ref 3.0–12.0)
Neutro Abs: 6.2 10*3/uL (ref 1.4–7.7)
Neutrophils Relative %: 64.6 % (ref 43.0–77.0)
Platelets: 310 10*3/uL (ref 150.0–400.0)
RBC: 4.97 Mil/uL (ref 4.22–5.81)
RDW: 14.3 % (ref 11.5–15.5)
WBC: 9.7 10*3/uL (ref 4.0–10.5)

## 2016-06-11 LAB — COMPREHENSIVE METABOLIC PANEL
ALBUMIN: 4.4 g/dL (ref 3.5–5.2)
ALT: 25 U/L (ref 0–53)
AST: 18 U/L (ref 0–37)
Alkaline Phosphatase: 61 U/L (ref 39–117)
BUN: 16 mg/dL (ref 6–23)
CALCIUM: 10.3 mg/dL (ref 8.4–10.5)
CHLORIDE: 101 meq/L (ref 96–112)
CO2: 28 mEq/L (ref 19–32)
Creatinine, Ser: 0.98 mg/dL (ref 0.40–1.50)
GFR: 81.75 mL/min (ref >60.00)
Glucose, Bld: 170 mg/dL — ABNORMAL HIGH (ref 70–99)
POTASSIUM: 4.6 meq/L (ref 3.5–5.1)
SODIUM: 138 meq/L (ref 135–145)
Total Bilirubin: 0.8 mg/dL (ref 0.2–1.2)
Total Protein: 7 g/dL (ref 6.0–8.3)

## 2016-06-11 LAB — HEMOGLOBIN A1C: HEMOGLOBIN A1C: 6.7 % — AB (ref 4.6–6.5)

## 2016-06-11 LAB — PSA: PSA: 2.22 ng/mL (ref 0.10–4.00)

## 2016-06-11 LAB — TSH: TSH: 0.64 u[IU]/mL (ref 0.35–4.50)

## 2016-06-13 ENCOUNTER — Telehealth: Payer: Self-pay

## 2016-06-13 ENCOUNTER — Ambulatory Visit (INDEPENDENT_AMBULATORY_CARE_PROVIDER_SITE_OTHER): Payer: 59 | Admitting: Internal Medicine

## 2016-06-13 ENCOUNTER — Encounter: Payer: Self-pay | Admitting: Internal Medicine

## 2016-06-13 ENCOUNTER — Other Ambulatory Visit: Payer: Self-pay | Admitting: Family Medicine

## 2016-06-13 VITALS — BP 100/70 | HR 88 | Ht 71.0 in | Wt 220.2 lb

## 2016-06-13 DIAGNOSIS — Z86010 Personal history of colonic polyps: Secondary | ICD-10-CM

## 2016-06-13 DIAGNOSIS — Z7902 Long term (current) use of antithrombotics/antiplatelets: Secondary | ICD-10-CM | POA: Diagnosis not present

## 2016-06-13 DIAGNOSIS — I255 Ischemic cardiomyopathy: Secondary | ICD-10-CM | POA: Diagnosis not present

## 2016-06-13 NOTE — Progress Notes (Signed)
   Subjective:    Patient ID: Harry Payne, male    DOB: 08-03-1951, 65 y.o.   MRN: [id number] Cc: hx colon polyps - is on Plavix HPI No active GI Sxs Single adenoma 2011 CHF sxs stable - had chest/abd pain earlier 2017 turned out to be gallbladder and had cholecystectomy  Medications, allergies, past medical history, past surgical history, family history and social history are reviewed and updated in the EMR.   Review of Systems No chest pain dyspnea now    Objective:   Physical Exam @BP  100/70 mmHg  Pulse 88  Ht 5\' 11"  (1.803 m)  Wt 220 lb 4 oz (99.905 kg)  BMI 30.73 kg/m2@  General:  NAD Eyes:   anicteric Lungs:  clear Heart:: S1S2 no rubs, murmurs or gallops   Data Reviewed:  As above     Assessment & Plan:   Encounter Diagnoses  Name Primary?  Marland Kitchen Hx of adenomatous colonic polyps Yes  . Long term current use of antithrombotics/antiplatelets - clopidogrel and ASA   . Cardiomyopathy, ischemic     Colonoscopy appropriate for surveillance exam - hx adenomas 2006 and 2011.  EF 35-45% on cath 01/2016.  Hold clopidogrel5 days before procedure - will instruct when and how to resume after procedure. Risks and benefits of procedure including bleeding, perforation, infection, missed lesions, medication reactions and possible hospitalization or surgery if complications occur explained. Additional rare but real risk of cardiovascular event such as heart attack or ischemia/infarct of other organs off clopidogrel explained and need to seek urgent help if this occurs. Will communicate by phone or EMR with patient's prescribing provider that to confirm holding clopidogrel is reasonable in this case.   Lynford Humphrey, MD

## 2016-06-13 NOTE — Telephone Encounter (Signed)
[facility] [address]  [place] [address]  DOB: [birth year] MRN: [id number]   Dear Glori Bickers,    We have scheduled the above patient for an endoscopic procedure. Our records show that he is on anticoagulation therapy.   Please advise as to how long the patient may come off his therapy of Plavix prior to the colonoscopy procedure, which is scheduled for 08/27/16.  Please fax back/ or route the completed form to Kristoph Sattler [place], [place] at [phone].   Sincerely,    Silvano Rusk, MD,  [facility]

## 2016-06-13 NOTE — Patient Instructions (Addendum)
  You have been scheduled for a colonoscopy. Please follow written instructions given to you at your visit today.  Please pick up your prep supplies at the pharmacy. If you use inhalers (even only as needed), please bring them with you on the day of your procedure. Patient may have a light breakfast the prep day per Dr Carlean Purl.  You will be contacted by our office prior to your procedure for directions on holding your Plavix.  If you do not hear from our office 1 week prior to your scheduled procedure, please call [phone] to discuss.    I appreciate the opportunity to care for you. Silvano Rusk, MD, [facility]

## 2016-06-18 ENCOUNTER — Ambulatory Visit (INDEPENDENT_AMBULATORY_CARE_PROVIDER_SITE_OTHER): Payer: 59 | Admitting: Family Medicine

## 2016-06-18 ENCOUNTER — Encounter: Payer: Self-pay | Admitting: Family Medicine

## 2016-06-18 VITALS — BP 110/66 | HR 81 | Temp 98.8°F | Ht 71.0 in | Wt 217.5 lb

## 2016-06-18 DIAGNOSIS — E119 Type 2 diabetes mellitus without complications: Secondary | ICD-10-CM

## 2016-06-18 DIAGNOSIS — E785 Hyperlipidemia, unspecified: Secondary | ICD-10-CM

## 2016-06-18 DIAGNOSIS — E669 Obesity, unspecified: Secondary | ICD-10-CM | POA: Diagnosis not present

## 2016-06-18 DIAGNOSIS — Z125 Encounter for screening for malignant neoplasm of prostate: Secondary | ICD-10-CM

## 2016-06-18 DIAGNOSIS — Z0000 Encounter for general adult medical examination without abnormal findings: Secondary | ICD-10-CM | POA: Diagnosis not present

## 2016-06-18 NOTE — Assessment & Plan Note (Signed)
Disc goals for lipids and reasons to control them Rev labs with pt Rev low sat fat diet in detail Labs reviewed  On stating  rec diet for HDL

## 2016-06-18 NOTE — Assessment & Plan Note (Signed)
Lab Results  Component Value Date   PSA 2.22 06/11/2016   PSA 1.65 03/11/2014   PSA 1.86 01/20/2013   Has a family hx of prostate cancer No changes in urinary habits  Will have prostate check with his colonoscopy upcoming

## 2016-06-18 NOTE — Patient Instructions (Addendum)
Take care of yourself  Keep exercising  Don't forget to schedule your eye exam  Get your colonoscopy as planned   Labs are stable  Follow up in 6 months with lab prior

## 2016-06-18 NOTE — Telephone Encounter (Signed)
Ok to hold plavix for 5 days 

## 2016-06-18 NOTE — Progress Notes (Signed)
Subjective:    Patient ID: Harry Payne, male    DOB: 1951-02-15, 65 y.o.   MRN: [id number]  HPI Here for health maintenance exam and to review chronic medical problems    Feeling ok   Wt is down 3 lb with bmi of 30.3 Has been watching what he eats -esp portion sizes  Doing some exercise -walking / every day    HIV screen - does not think he needs it -had to have it yearly at work in the past - all neg and not high risk   Has had neg hep C test in the past   opth exam -about a year ago  No change in vision that he can tell   Declines flu and zoster vaccines   Td 7/09  Colonoscopy 9/11-small adenomatous polyp Has appt scheduled for his next one   Neg hep C screen 3/17   Hx of labile bp BP Readings from Last 3 Encounters:  06/18/16 110/66  06/13/16 100/70  05/21/16 126/66   on spironolactone and altace    DM2 Lab Results  Component Value Date   HGBA1C 6.7* 06/11/2016   This is up from 6.6  Still in good control  No medicine changes  On ace for renal protection    Cholesterol Lab Results  Component Value Date   CHOL 119 06/11/2016   CHOL 115 12/08/2015   CHOL 148 03/06/2015   Lab Results  Component Value Date   HDL 30.80* 06/11/2016   HDL 29.40* 12/08/2015   HDL 28.20* 03/06/2015   Lab Results  Component Value Date   LDLCALC 63 06/11/2016   LDLCALC 67 12/08/2015   LDLCALC 89 03/06/2015   Lab Results  Component Value Date   TRIG 126.0 06/11/2016   TRIG 94.0 12/08/2015   TRIG 155.0* 03/06/2015   Lab Results  Component Value Date   CHOLHDL 4 06/11/2016   CHOLHDL 4 12/08/2015   CHOLHDL 5 03/06/2015   No results found for: LDLDIRECT  On lipitor 80 and diet  HDL is still too low   Takes plavix- hx ofCAD  Had CCY in march   Prostate hx  Father had prostate ca No problem emptying bladder  Nocturia - 0-1 times  Lab Results  Component Value Date   PSA 2.22 06/11/2016   PSA 1.65 03/11/2014   PSA 1.86 01/20/2013  will have a  prostate exam with upcoming colonoscopy   Patient Active Problem List   Diagnosis Date Noted  . Long term current use of antithrombotics/antiplatelets - clopidogrel and ASA 06/13/2016  . Routine general medical examination at a health care facility 06/06/2016  . Prostate cancer screening 06/06/2016  . Blood pressure instability 04/03/2016  . Abdominal pain 03/25/2016  . Hypoxia 03/25/2016  . Steatosis, liver 03/25/2016  . Chronic systolic CHF (congestive heart failure) ([place]) 02/26/2016  . Old anterior myocardial infarction 02/26/2016  . CAD (coronary artery disease), native coronary artery   . AICD (automatic cardioverter/defibrillator) present   . Ischemic cardiomyopathy 06/14/2015  . Obesity 03/25/2012  . Non-insulin dependent type 2 diabetes mellitus ([place])   . Hyperlipidemia 02/09/2008  . GERD 02/09/2008   Past Medical History  Diagnosis Date  . CAD (coronary artery disease)   . GERD (gastroesophageal reflux disease)   . Hx of adenomatous colonic polyps   . Hemorrhoids   . AICD (automatic cardioverter/defibrillator) present   . Hypotension   . CHF (congestive heart failure) ([place])   . Anterior myocardial infarction [facility]) 2004 X  2  . Hyperlipidemia   . Type II diabetes mellitus ([place])   . Cholecystitis 03/25/2016   Past Surgical History  Procedure Laterality Date  . Closed reduction hand fracture Left 1990  . Ep implantable device N/A 06/14/2015    Procedure: ICD Implant;  Surgeon: Deboraha Sprang, MD;  Location: [facility];  Service: Cardiovascular;  Laterality: N/A;  . Coronary angioplasty with stent placement  2004    "2"  . Cardiac catheterization  2012    "no stent"  . Cardiac catheterization N/A 02/27/2016    Procedure: Left Heart Cath and Coronary Angiography;  Surgeon: Peter M [place], MD;  Location: [facility];  Service: Cardiovascular;  Laterality: N/A;  . Cholecystectomy N/A 03/27/2016    Procedure: LAPAROSCOPIC CHOLECYSTECTOMY WITH INTRAOPERATIVE  CHOLANGIOGRAM;  Surgeon: Judeth Horn, MD;  Location: [facility];  Service: General;  Laterality: N/A;  . Colonoscopy w/ biopsies     Social History  Substance Use Topics  . Smoking status: Former Smoker -- 2.50 packs/day for 37 years    Types: Cigarettes    Quit date: 12/30/2001  . Smokeless tobacco: Never Used  . Alcohol Use: No   Family History  Problem Relation Age of Onset  . Prostate cancer Father   . Diabetes Brother     x3  . Heart disease Brother    Allergies  Allergen Reactions  . Morphine And Related Other (See Comments)    Tachycardia, goes crazy   Current Outpatient Prescriptions on File Prior to Visit  Medication Sig Dispense Refill  . acetaminophen (TYLENOL) 500 MG tablet Take 500 mg by mouth every 6 (six) hours as needed for mild pain or moderate pain.    Marland Kitchen aspirin 81 MG tablet Take 81 mg by mouth daily.     Marland Kitchen atorvastatin (LIPITOR) 80 MG tablet TAKE 1 TABLET (80 MG TOTAL) BY MOUTH DAILY. 30 tablet 3  . carvedilol (COREG) 25 MG tablet Take 0.5 tablets (12.5 mg total) by mouth 2 (two) times daily with a meal. 330 tablet 6  . clopidogrel (PLAVIX) 75 MG tablet TAKE 1 TABLET (75 MG TOTAL) BY MOUTH DAILY. 30 tablet 6  . glipiZIDE (GLUCOTROL XL) 5 MG 24 hr tablet TAKE 1 TABLET (5 MG TOTAL) BY MOUTH DAILY WITH BREAKFAST. 90 tablet 3  . metFORMIN (GLUCOPHAGE) 1000 MG tablet TAKE 1 TABLET (1,000 MG TOTAL) BY MOUTH 2 (TWO) TIMES DAILY WITH A MEAL. 60 tablet 2  . NITROSTAT 0.4 MG SL tablet PLACE 1 TABLET (0.4 MG TOTAL) UNDER THE TONGUE EVERY 5 (FIVE) MINUTES AS NEEDED. 25 tablet 4  . Omega-3 Fatty Acids (FISH OIL) 1000 MG CAPS Take 1 capsule by mouth daily.     . ONE TOUCH ULTRA TEST test strip CHECK BLOOD SUGAR ONCE DAY AS NEEDED FOR DM E11.9 100 each 1  . ramipril (ALTACE) 2.5 MG capsule Take 1 capsule (2.5 mg total) by mouth daily. 90 capsule 3  . ranitidine (ZANTAC) 150 MG tablet TAKE 1 TABLET (150 MG TOTAL) BY MOUTH 2 (TWO) TIMES DAILY. 180 tablet 1  . spironolactone  (ALDACTONE) 25 MG tablet Take 0.5 tablets (12.5 mg total) by mouth daily. 45 tablet 3  . vitamin B-12 (CYANOCOBALAMIN) 1000 MCG tablet Take 1,000 mcg by mouth daily.      . [DISCONTINUED] pantoprazole (PROTONIX) 40 MG tablet Take 1 tablet (40 mg total) by mouth daily. 30 tablet 11   No current facility-administered medications on file prior to visit.    Review  of Systems    Review of Systems  Constitutional: Negative for fever, appetite change, and unexpected weight change.  Eyes: Negative for pain and visual disturbance.  Respiratory: Negative for cough and shortness of breath.   Cardiovascular: Negative for cp or palpitations    Gastrointestinal: Negative for nausea, diarrhea and constipation.  Genitourinary: Negative for urgency and frequency.  Skin: Negative for pallor or rash   Neurological: Negative for weakness, light-headedness, numbness and headaches.  Hematological: Negative for adenopathy. Does not bruise/bleed easily.  Psychiatric/Behavioral: Negative for dysphoric mood. The patient is not nervous/anxious.      Objective:   Physical Exam  Constitutional: He appears well-developed and well-nourished. No distress.  obese and well appearing   HENT:  Head: Normocephalic and atraumatic.  Right Ear: External ear normal.  Left Ear: External ear normal.  Nose: Nose normal.  Mouth/Throat: Oropharynx is clear and moist.  Eyes: Conjunctivae and EOM are normal. Pupils are equal, round, and reactive to light. Right eye exhibits no discharge. Left eye exhibits no discharge. No scleral icterus.  Neck: Normal range of motion. Neck supple. No JVD present. Carotid bruit is not present. No thyromegaly present.  Cardiovascular: Normal rate, regular rhythm, normal heart sounds and intact distal pulses.  Exam reveals no gallop.   Pulmonary/Chest: Effort normal and breath sounds normal. No respiratory distress. He has no wheezes. He exhibits no tenderness.  Abdominal: Soft. Bowel sounds are  normal. He exhibits no distension, no abdominal bruit and no mass. There is no tenderness.  Musculoskeletal: He exhibits no edema or tenderness.  Lymphadenopathy:    He has no cervical adenopathy.  Neurological: He is alert. He has normal reflexes. No cranial nerve deficit. He exhibits normal muscle tone. Coordination normal.  Skin: Skin is warm and dry. No rash noted. No erythema. No pallor.  Solar lentigines noted Some SKs  Psychiatric: He has a normal mood and affect.          Assessment & Plan:   Problem List Items Addressed This Visit      Endocrine   Non-insulin dependent type 2 diabetes mellitus ([place]) - Primary (Chronic)    Lab Results  Component Value Date   HGBA1C 6.7* 06/11/2016   This is fairly stable He will schedule his annual eye exam  Counseled [id number] and exercise      Relevant Orders   Comprehensive metabolic panel   Hemoglobin A1c     Other   Routine general medical examination at a health care facility    Reviewed health habits including diet and exercise and skin cancer prevention Reviewed appropriate screening tests for age  Also reviewed health mt list, fam hx and immunization status , as well as social and family history   See HPI Labs reviewed Colonoscopy scheduled upcoming  Declines HIV screen Take care of yourself  Keep exercising  Don't forget to schedule your eye exam  Get your colonoscopy as planned       Prostate cancer screening    Lab Results  Component Value Date   PSA 2.22 06/11/2016   PSA 1.65 03/11/2014   PSA 1.86 01/20/2013   Has a family hx of prostate cancer No changes in urinary habits  Will have prostate check with his colonoscopy upcoming      Obesity (Chronic)    Discussed how this problem influences overall health and the risks it imposes  Reviewed plan for weight loss with lower calorie diet (via better food choices and also portion control or program  like weight watchers) and exercise building up to or more  than 30 minutes 5 days per week including some aerobic activity         Hyperlipidemia (Chronic)    Disc goals for lipids and reasons to control them Rev labs with pt Rev low sat fat diet in detail Labs reviewed  On stating  rec diet for HDL      Relevant Orders   Comprehensive metabolic panel   Lipid panel

## 2016-06-18 NOTE — Assessment & Plan Note (Signed)
Reviewed health habits including diet and exercise and skin cancer prevention Reviewed appropriate screening tests for age  Also reviewed health mt list, fam hx and immunization status , as well as social and family history   See HPI Labs reviewed Colonoscopy scheduled upcoming  Declines HIV screen Take care of yourself  Keep exercising  Don't forget to schedule your eye exam  Get your colonoscopy as planned

## 2016-06-18 NOTE — Assessment & Plan Note (Signed)
Lab Results  Component Value Date   HGBA1C 6.7* 06/11/2016   This is fairly stable He will schedule his annual eye exam  Counseled [id number] and exercise

## 2016-06-18 NOTE — Assessment & Plan Note (Signed)
Discussed how this problem influences overall health and the risks it imposes  Reviewed plan for weight loss with lower calorie diet (via better food choices and also portion control or program like weight watchers) and exercise building up to or more than 30 minutes 5 days per week including some aerobic activity    

## 2016-06-18 NOTE — Progress Notes (Signed)
Pre visit review using our clinic review tool, if applicable. No additional management support is needed unless otherwise documented below in the visit note. 

## 2016-06-19 NOTE — Telephone Encounter (Signed)
Informed patient to hold plavix 5 days, he verbalized understanding.

## 2016-06-25 ENCOUNTER — Encounter: Payer: Self-pay | Admitting: Internal Medicine

## 2016-06-25 ENCOUNTER — Ambulatory Visit (INDEPENDENT_AMBULATORY_CARE_PROVIDER_SITE_OTHER): Payer: 59 | Admitting: Internal Medicine

## 2016-06-25 VITALS — BP 118/72 | HR 77 | Ht 71.0 in | Wt 218.2 lb

## 2016-06-25 DIAGNOSIS — I255 Ischemic cardiomyopathy: Secondary | ICD-10-CM | POA: Diagnosis not present

## 2016-06-25 DIAGNOSIS — I5022 Chronic systolic (congestive) heart failure: Secondary | ICD-10-CM | POA: Diagnosis not present

## 2016-06-25 DIAGNOSIS — Z95810 Presence of automatic (implantable) cardiac defibrillator: Secondary | ICD-10-CM | POA: Diagnosis not present

## 2016-06-25 LAB — CUP PACEART INCLINIC DEVICE CHECK
Battery Remaining Longevity: 134 mo
Date Time Interrogation Session: 20170627160546
HIGH POWER IMPEDANCE MEASURED VALUE: 86 Ohm
Lead Channel Impedance Value: 475 Ohm
Lead Channel Impedance Value: 532 Ohm
Lead Channel Setting Pacing Amplitude: 2.5 V
Lead Channel Setting Pacing Pulse Width: 0.4 ms
Lead Channel Setting Sensing Sensitivity: 0.3 mV
MDC IDC LEAD IMPLANT DT: 20160615
MDC IDC LEAD LOCATION: 753860
MDC IDC MSMT BATTERY VOLTAGE: 3.04 V
MDC IDC MSMT LEADCHNL RV PACING THRESHOLD AMPLITUDE: 0.75 V
MDC IDC MSMT LEADCHNL RV PACING THRESHOLD PULSEWIDTH: 0.4 ms
MDC IDC MSMT LEADCHNL RV SENSING INTR AMPL: 30.125 mV
MDC IDC MSMT LEADCHNL RV SENSING INTR AMPL: 31.625 mV
MDC IDC STAT BRADY RV PERCENT PACED: 0.01 %

## 2016-06-25 NOTE — Patient Instructions (Signed)
Medication Instructions: - Your physician recommends that you continue on your current medications as directed. Please refer to the Current Medication list given to you today.  Labwork: - none  Procedures/Testing: - none  Follow-Up: - Remote monitoring is used to monitor your Pacemaker of ICD from home. This monitoring reduces the number of office visits required to check your device to one time per year. It allows [place] to keep an eye on the functioning of your device to ensure it is working properly. You are scheduled for a device check from home on 09/24/16. You may send your transmission at any time that day. If you have a wireless device, the transmission will be sent automatically. After your physician reviews your transmission, you will receive a postcard with your next transmission date.  - Your physician wants you to follow-up in: 1 year with Dr. Caryl Comes. You will receive a reminder letter in the mail two months in advance. If you don't receive a letter, please call our office to schedule the follow-up appointment.  Any Additional Special Instructions Will Be Listed Below (If Applicable).     If you need a refill on your cardiac medications before your next appointment, please call your pharmacy.

## 2016-06-25 NOTE — Progress Notes (Signed)
Patient Care Team: Abner Greenspan, MD as PCP - General   HPI  Harry Payne is a 65 y.o. male  Seen in follow-up for an ICD implanted for primary prevention in 6/16.   He has a history of CAD s/p anterior MI 2004 (s/p stenting LAD and LCX), DM2, and hyperlipidemia (previously in AIM-HIGH trial). EF 48% by MR. Myoview 2009 EF 43%. anterior apical scar. no ischemia.    Echo repeated 5/16 EF 30%  LHC 02/27/2016  Prox Cx lesion, 35% stenosed. LAD stent open  There is moderate left ventricular systolic dysfunction. 1. Nonobstructive CAD 2. Moderate LV dysfunction 3. No significant change since 2012.   ECHO 01/2016: EF 35-40%. Grade I DD  The patient denies chest pain, shortness of breath, nocturnal dyspnea, orthopnea or peripheral edema.  There have been no palpitations, lightheadedness or syncope.    Past Medical History  Diagnosis Date  . CAD (coronary artery disease)   . GERD (gastroesophageal reflux disease)   . Hx of adenomatous colonic polyps   . Hemorrhoids   . AICD (automatic cardioverter/defibrillator) present   . Hypotension   . CHF (congestive heart failure) ([place])   . Anterior myocardial infarction ([place]) 2004 X 2  . Hyperlipidemia   . Type II diabetes mellitus ([place])   . Cholecystitis 03/25/2016    Past Surgical History  Procedure Laterality Date  . Closed reduction hand fracture Left 1990  . Ep implantable device N/A 06/14/2015    Procedure: ICD Implant;  Surgeon: Deboraha Sprang, MD;  Location: [facility];  Service: Cardiovascular;  Laterality: N/A;  . Coronary angioplasty with stent placement  2004    "2"  . Cardiac catheterization  2012    "no stent"  . Cardiac catheterization N/A 02/27/2016    Procedure: Left Heart Cath and Coronary Angiography;  Surgeon: Peter M Martinique, MD;  Location: [facility];  Service: Cardiovascular;  Laterality: N/A;  . Cholecystectomy N/A 03/27/2016    Procedure: LAPAROSCOPIC CHOLECYSTECTOMY WITH  INTRAOPERATIVE CHOLANGIOGRAM;  Surgeon: Judeth Horn, MD;  Location: [facility];  Service: General;  Laterality: N/A;  . Colonoscopy w/ biopsies      Current Outpatient Prescriptions  Medication Sig Dispense Refill  . acetaminophen (TYLENOL) 500 MG tablet Take 500 mg by mouth every 6 (six) hours as needed for mild pain or moderate pain.    Marland Kitchen aspirin 81 MG tablet Take 81 mg by mouth daily.     Marland Kitchen atorvastatin (LIPITOR) 80 MG tablet TAKE 1 TABLET (80 MG TOTAL) BY MOUTH DAILY. 30 tablet 3  . carvedilol (COREG) 25 MG tablet Take 0.5 tablets (12.5 mg total) by mouth 2 (two) times daily with a meal. 330 tablet 6  . clopidogrel (PLAVIX) 75 MG tablet TAKE 1 TABLET (75 MG TOTAL) BY MOUTH DAILY. 30 tablet 6  . glipiZIDE (GLUCOTROL XL) 5 MG 24 hr tablet TAKE 1 TABLET (5 MG TOTAL) BY MOUTH DAILY WITH BREAKFAST. 90 tablet 3  . metFORMIN (GLUCOPHAGE) 1000 MG tablet TAKE 1 TABLET (1,000 MG TOTAL) BY MOUTH 2 (TWO) TIMES DAILY WITH A MEAL. 60 tablet 2  . NITROSTAT 0.4 MG SL tablet PLACE 1 TABLET (0.4 MG TOTAL) UNDER THE TONGUE EVERY 5 (FIVE) MINUTES AS NEEDED. 25 tablet 4  . Omega-3 Fatty Acids (FISH OIL) 1000 MG CAPS Take 1 capsule by mouth daily.     . ONE TOUCH ULTRA TEST test strip CHECK BLOOD SUGAR ONCE DAY AS NEEDED FOR DM E11.9  100 each 1  . ramipril (ALTACE) 2.5 MG capsule Take 1 capsule (2.5 mg total) by mouth daily. 90 capsule 3  . ranitidine (ZANTAC) 150 MG tablet TAKE 1 TABLET (150 MG TOTAL) BY MOUTH 2 (TWO) TIMES DAILY. 180 tablet 1  . spironolactone (ALDACTONE) 25 MG tablet Take 0.5 tablets (12.5 mg total) by mouth daily. 45 tablet 3  . vitamin B-12 (CYANOCOBALAMIN) 1000 MCG tablet Take 1,000 mcg by mouth daily.      . [DISCONTINUED] pantoprazole (PROTONIX) 40 MG tablet Take 1 tablet (40 mg total) by mouth daily. 30 tablet 11   No current facility-administered medications for this visit.    Allergies  Allergen Reactions  . Morphine And Related Other (See Comments)    Tachycardia, goes crazy       Review of Systems negative except from HPI and PMH  Physical Exam BP 118/72 mmHg  Pulse 77  Ht 5\' 11"  (1.803 m)  Wt 218 lb 3.2 oz (98.975 kg)  BMI 30.45 kg/m2 Well developed and well nourished in no acute distress HENT normal E scleral and icterus clear Neck Supple JVP flat; carotids brisk and full Clear to ausculation Device pocket well healed; without hematoma or erythema.  There is no tethering   Regular rate and rhythm, no murmurs gallops or rub Soft with active bowel sounds No clubbing cyanosis  Edema Alert and oriented, grossly normal motor and sensory function Skin Warm and Dry  ECG dated today demonstrates sinus rhythm at 77 Intervals 16/08/37 Axis is 73 Prior anteroseptal MI  Assessment and  Plan  Ischemic cardiomyopathy  Congestive heart failure-chronic-systolic-class II  Implantable defibrillator-Medtronic-single chamber    Without symptoms of ischemia  Euvolemic continue current meds

## 2016-06-28 LAB — HM DIABETES EYE EXAM

## 2016-08-11 ENCOUNTER — Other Ambulatory Visit (HOSPITAL_COMMUNITY): Payer: Self-pay | Admitting: Internal Medicine

## 2016-08-12 ENCOUNTER — Other Ambulatory Visit: Payer: Self-pay | Admitting: Family Medicine

## 2016-08-13 ENCOUNTER — Encounter: Payer: Self-pay | Admitting: Internal Medicine

## 2016-08-21 ENCOUNTER — Other Ambulatory Visit (HOSPITAL_COMMUNITY): Payer: Self-pay | Admitting: *Deleted

## 2016-08-21 MED ORDER — CARVEDILOL 25 MG PO TABS: 12.5000 mg | ORAL_TABLET | Freq: Two times a day (BID) | ORAL | 6 refills | Status: DC

## 2016-08-26 IMAGING — NM NM MISC PROCEDURE
6 series · 36 of 36 positions shown · non-contrast
Comparison: none

[Series 1: stress-gsp_(id)_sa · 6.4mm · 6.40mm/px · 6 of 512 frames shown (1 of 2)]
[frame 43/512]
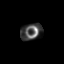
[frame 128/512]
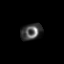
[frame 214/512]
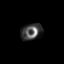
[frame 299/512]
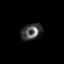
[frame 384/512]
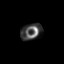
[frame 470/512]
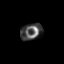

[Series 1: rest_(id)_sa · 6.4mm · 6.40mm/px · 6 of 64 frames shown (1 of 2)]
[frame 6/64]
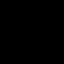
[frame 16/64]
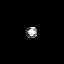
[frame 27/64]
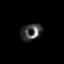
[frame 38/64]
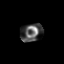
[frame 48/64]
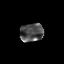
[frame 59/64]
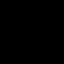

[Series 1: stress-sum-em_(id)_sa · 6.4mm · 6.40mm/px · 6 of 64 frames shown (1 of 2)]
[frame 6/64]
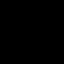
[frame 16/64]
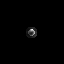
[frame 27/64]
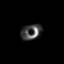
[frame 38/64]
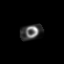
[frame 48/64]
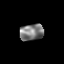
[frame 59/64]
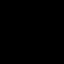

[Series 1: stress-sum-em_(id)_sa · 6.4mm · 6.40mm/px · 6 of 64 frames shown (2 of 2)]
[frame 6/64]
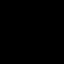
[frame 16/64]
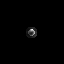
[frame 27/64]
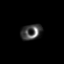
[frame 38/64]
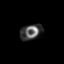
[frame 48/64]
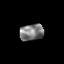
[frame 59/64]
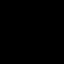

[Series 1: stress-gsp_(id)_sa · 6.4mm · 6.40mm/px · 6 of 512 frames shown (2 of 2)]
[frame 43/512]
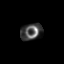
[frame 128/512]
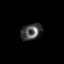
[frame 214/512]
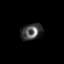
[frame 299/512]
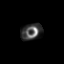
[frame 384/512]
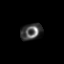
[frame 470/512]
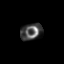

[Series 1: rest_(id)_sa · 6.4mm · 6.40mm/px · 6 of 64 frames shown (2 of 2)]
[frame 6/64]
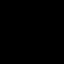
[frame 16/64]
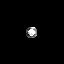
[frame 27/64]
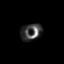
[frame 38/64]
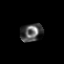
[frame 48/64]
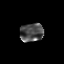
[frame 59/64]
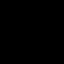

[36 of 36 positions shown; findings below may reference images not displayed]

Canned report from images found in remote index.

Refer to host system for actual result text.

## 2016-08-27 ENCOUNTER — Ambulatory Visit (AMBULATORY_SURGERY_CENTER): Payer: 59 | Admitting: Internal Medicine

## 2016-08-27 ENCOUNTER — Other Ambulatory Visit: Payer: Self-pay | Admitting: Family Medicine

## 2016-08-27 ENCOUNTER — Encounter: Payer: Self-pay | Admitting: Internal Medicine

## 2016-08-27 VITALS — BP 125/77 | HR 58 | Temp 96.4°F | Resp 14 | Ht 71.0 in | Wt 220.0 lb

## 2016-08-27 DIAGNOSIS — D123 Benign neoplasm of transverse colon: Secondary | ICD-10-CM

## 2016-08-27 DIAGNOSIS — D122 Benign neoplasm of ascending colon: Secondary | ICD-10-CM | POA: Diagnosis not present

## 2016-08-27 DIAGNOSIS — Z86010 Personal history of colonic polyps: Secondary | ICD-10-CM

## 2016-08-27 LAB — GLUCOSE, CAPILLARY
Glucose-Capillary: 135 mg/dL — ABNORMAL HIGH (ref 65–99)
Glucose-Capillary: 122 mg/dL — ABNORMAL HIGH (ref 65–99)

## 2016-08-27 MED ORDER — SODIUM CHLORIDE 0.9 % IV SOLN: 500.0000 mL | INTRAVENOUS | Status: DC

## 2016-08-27 NOTE — Progress Notes (Signed)
A and O x3. Report to RN. Tolerated MAC anesthesia well. 

## 2016-08-27 NOTE — Op Note (Signed)
[facility] Patient Name: Harry Payne Enter Procedure Date: 08/27/2016 8:19 AM MRN: [id number] Endoscopist: Gatha Mayer , MD Age: 65 Referring MD:  Date of Birth: [birth year] Gender: Male Account #: [id number] Procedure:                Colonoscopy Indications:              Surveillance: Personal history of adenomatous                            polyps on last colonoscopy > 5 years ago Medicines:                Propofol per Anesthesia, Monitored Anesthesia Care Procedure:                Pre-Anesthesia Assessment:                           - Prior to the procedure, a History and Physical                            was performed, and patient medications and                            allergies were reviewed. The patient's tolerance of                            previous anesthesia was also reviewed. The risks                            and benefits of the procedure and the sedation                            options and risks were discussed with the patient.                            All questions were answered, and informed consent                            was obtained. Prior Anticoagulants: The patient                            last took aspirin 1 day and Plavix (clopidogrel) 5                            days prior to the procedure. ASA Grade Assessment:                            III - A patient with severe systemic disease. After                            reviewing the risks and benefits, the patient was                            deemed in satisfactory condition to undergo the  procedure.                           After obtaining informed consent, the colonoscope                            was passed under direct vision. Throughout the                            procedure, the patient's blood pressure, pulse, and                            oxygen saturations were monitored continuously. The                            Model CF-[id number])  scope was introduced                            through the anus and advanced to the the cecum,                            identified by appendiceal orifice and ileocecal                            valve. The colonoscopy was technically difficult                            and complex due to significant looping. Successful                            completion of the procedure was aided by changing                            the patient to a supine position. The patient                            tolerated the procedure well. The quality of the                            bowel preparation was excellent. The bowel                            preparation used was Miralax. The ileocecal valve,                            appendiceal orifice, and rectum were photographed. Scope In: 8:38:36 AM Scope Out: 8:59:12 AM Scope Withdrawal Time: 0 hours 11 minutes 15 seconds  Total Procedure Duration: 0 hours 20 minutes 36 seconds  Findings:                 The perianal and digital rectal examinations were                            normal. Pertinent negatives include normal prostate                            (  size, shape, and consistency).                           Two sessile polyps were found in the transverse                            colon and ascending colon. The polyps were 2 to 3                            mm in size. These polyps were removed with a cold                            biopsy forceps. Resection and retrieval were                            complete. Verification of patient identification                            for the specimen was done. Estimated blood loss was                            minimal.                           The exam was otherwise without abnormality on                            direct and retroflexion views. HAD TO MOVE TO BACK                            TO FACILITATE PASSAGE OF SCOPE THROUGH REDUNDANT                            LEFT COLON AND REACH  CECUM Complications:            No immediate complications. Estimated Blood Loss:     Estimated blood loss was minimal. Impression:               - Two 2 to 3 mm polyps in the transverse colon and                            in the ascending colon, removed with a cold biopsy                            forceps. Resected and retrieved.                           - The examination was otherwise normal on direct                            and retroflexion views.                           - Personal history of colonic polyps. adenopmas  2006 (2), 2011 (1) Recommendation:           - Patient has a contact number available for                            emergencies. The signs and symptoms of potential                            delayed complications were discussed with the                            patient. Return to normal activities tomorrow.                            Written discharge instructions were provided to the                            patient.                           - Resume previous diet.                           - Continue present medications.                           - Resume aspirin today and Plavix (clopidogrel)                            today at prior doses.                           - Repeat colonoscopy is recommended for                            surveillance. The colonoscopy date will be                            determined after pathology results from today's                            exam become available for review. Gatha Mayer, MD 08/27/2016 9:11:55 AM This report has been signed electronically.

## 2016-08-27 NOTE — Progress Notes (Signed)
Called to room to assist during endoscopic procedure.  Patient ID and intended procedure confirmed with present staff. Received instructions for my participation in the procedure from the performing physician.  

## 2016-08-27 NOTE — Patient Instructions (Addendum)
I found and removed 2 tiny polyps that look benign. I will let you know pathology results and when to have another routine colonoscopy by mail. Your next routine colonoscopy will likely  be in 5 years - 2022.  Restart Plavix today.   I appreciate the opportunity to care for you. Gatha Mayer, MD, FACG  YOU HAD AN ENDOSCOPIC PROCEDURE TODAY AT [facility]:   Refer to the procedure report that was given to you for any specific questions about what was found during the examination.  If the procedure report does not answer your questions, please call your gastroenterologist to clarify.  If you requested that your care partner not be given the details of your procedure findings, then the procedure report has been included in a sealed envelope for you to review at your convenience later.  YOU SHOULD EXPECT: Some feelings of bloating in the abdomen. Passage of more gas than usual.  Walking can help get rid of the air that was put into your GI tract during the procedure and reduce the bloating. If you had a lower endoscopy (such as a colonoscopy or flexible sigmoidoscopy) you may notice spotting of blood in your stool or on the toilet paper. If you underwent a bowel prep for your procedure, you may not have a normal bowel movement for a few days.  Please Note:  You might notice some irritation and congestion in your nose or some drainage.  This is from the oxygen used during your procedure.  There is no need for concern and it should clear up in a day or so.  SYMPTOMS TO REPORT IMMEDIATELY:   Following lower endoscopy (colonoscopy or flexible sigmoidoscopy):  Excessive amounts of blood in the stool  Significant tenderness or worsening of abdominal pains  Swelling of the abdomen that is new, acute  Fever of 100F or higher  For urgent or emergent issues, a gastroenterologist can be reached at any hour by calling [phone].   DIET:  We do recommend a small meal at  first, but then you may proceed to your regular diet.  Drink plenty of fluids but you should avoid alcoholic beverages for 24 hours.  ACTIVITY:  You should plan to take it easy for the rest of today and you should NOT DRIVE or use heavy machinery until tomorrow (because of the sedation medicines used during the test).    FOLLOW UP: Our staff will call the number listed on your records the next business day following your procedure to check on you and address any questions or concerns that you may have regarding the information given to you following your procedure. If we do not reach you, we will leave a message.  However, if you are feeling well and you are not experiencing any problems, there is no need to return our call.  We will assume that you have returned to your regular daily activities without incident.  If any biopsies were taken you will be contacted by phone or by letter within the next 1-3 weeks.  Please call us at [phone] if you have not heard about the biopsies in 3 weeks.    SIGNATURES/CONFIDENTIALITY: You and/or your care partner have signed paperwork which will be entered into your electronic medical record.  These signatures attest to the fact that that the information above on your After Visit Summary has been reviewed and is understood.  Full responsibility of the confidentiality of this discharge information lies with you  and/or your care-partner.  Please read polyp handout provided. Resume Plavix and aspirin today.

## 2016-08-28 ENCOUNTER — Telehealth: Payer: Self-pay

## 2016-08-28 NOTE — Telephone Encounter (Signed)
  Follow up Call-  Call back number 08/27/2016  Post procedure Call Back phone  # [phone]  Permission to leave phone message Yes  Some recent data might be hidden     Patient questions:  Do you have a fever, pain , or abdominal swelling? No. Pain Score  0 *  Have you tolerated food without any problems? Yes.    Have you been able to return to your normal activities? Yes.    Do you have any questions about your discharge instructions: Diet   No. Medications  No. Follow up visit  No.  Do you have questions or concerns about your Care? No.  Actions: * If pain score is 4 or above: No action needed, pain <4.

## 2016-08-29 ENCOUNTER — Encounter: Payer: Self-pay | Admitting: Internal Medicine

## 2016-08-29 DIAGNOSIS — Z86010 Personal history of colonic polyps: Secondary | ICD-10-CM | POA: Insufficient documentation

## 2016-08-29 NOTE — Progress Notes (Signed)
2 small adenomas Recall colonoscopy 2022 No letter My Chart message sent to patient

## 2016-09-15 ENCOUNTER — Other Ambulatory Visit: Payer: Self-pay | Admitting: Family Medicine

## 2016-09-24 ENCOUNTER — Ambulatory Visit (INDEPENDENT_AMBULATORY_CARE_PROVIDER_SITE_OTHER): Payer: 59 | Admitting: *Deleted

## 2016-09-24 ENCOUNTER — Telehealth: Payer: Self-pay | Admitting: Cardiology

## 2016-09-24 DIAGNOSIS — I255 Ischemic cardiomyopathy: Secondary | ICD-10-CM

## 2016-09-24 NOTE — Telephone Encounter (Signed)
LMOVM reminding pt to send remote transmission.   

## 2016-09-25 ENCOUNTER — Encounter: Payer: Self-pay | Admitting: Cardiology

## 2016-09-25 NOTE — Progress Notes (Signed)
Remote ICD transmission.   

## 2016-10-22 LAB — CUP PACEART REMOTE DEVICE CHECK
Battery Remaining Longevity: 132 mo
Date Time Interrogation Session: 20170927012807
HighPow Impedance: 89 Ohm
Implantable Lead Implant Date: 20160615
Lead Channel Pacing Threshold Pulse Width: 0.4 ms
Lead Channel Setting Pacing Amplitude: 2.5 V
Lead Channel Setting Pacing Pulse Width: 0.4 ms
Lead Channel Setting Sensing Sensitivity: 0.3 mV
MDC IDC LEAD LOCATION: 753860
MDC IDC MSMT BATTERY VOLTAGE: 3.03 V
MDC IDC MSMT LEADCHNL RV IMPEDANCE VALUE: 475 Ohm
MDC IDC MSMT LEADCHNL RV IMPEDANCE VALUE: 532 Ohm
MDC IDC MSMT LEADCHNL RV PACING THRESHOLD AMPLITUDE: 0.5 V
MDC IDC MSMT LEADCHNL RV SENSING INTR AMPL: 30.125 mV
MDC IDC MSMT LEADCHNL RV SENSING INTR AMPL: 30.125 mV
MDC IDC STAT BRADY RV PERCENT PACED: 0.01 %

## 2016-10-26 ENCOUNTER — Other Ambulatory Visit: Payer: Self-pay | Admitting: Internal Medicine

## 2016-12-04 ENCOUNTER — Encounter: Payer: Self-pay | Admitting: Family Medicine

## 2016-12-04 ENCOUNTER — Ambulatory Visit (INDEPENDENT_AMBULATORY_CARE_PROVIDER_SITE_OTHER): Payer: 59 | Admitting: Family Medicine

## 2016-12-04 VITALS — BP 130/74 | HR 91 | Temp 97.6°F | Ht 71.0 in | Wt 222.0 lb

## 2016-12-04 DIAGNOSIS — R8299 Other abnormal findings in urine: Secondary | ICD-10-CM

## 2016-12-04 DIAGNOSIS — R350 Frequency of micturition: Secondary | ICD-10-CM

## 2016-12-04 DIAGNOSIS — R319 Hematuria, unspecified: Secondary | ICD-10-CM | POA: Diagnosis not present

## 2016-12-04 DIAGNOSIS — N400 Enlarged prostate without lower urinary tract symptoms: Secondary | ICD-10-CM | POA: Insufficient documentation

## 2016-12-04 DIAGNOSIS — N401 Enlarged prostate with lower urinary tract symptoms: Secondary | ICD-10-CM

## 2016-12-04 DIAGNOSIS — R82998 Other abnormal findings in urine: Secondary | ICD-10-CM | POA: Insufficient documentation

## 2016-12-04 LAB — POCT UA - MICROSCOPIC ONLY

## 2016-12-04 LAB — POC URINALSYSI DIPSTICK (AUTOMATED)
Bilirubin, UA: NEGATIVE
Blood, UA: 200
Glucose, UA: 100
KETONES UA: NEGATIVE
Nitrite, UA: NEGATIVE
PH UA: 5.5
Spec Grav, UA: >=1.03
UROBILINOGEN UA: 0.2

## 2016-12-04 MED ORDER — SULFAMETHOXAZOLE-TRIMETHOPRIM 800-160 MG PO TABS: 1.0000 | ORAL_TABLET | Freq: Two times a day (BID) | ORAL | 0 refills | Status: DC

## 2016-12-04 NOTE — Assessment & Plan Note (Signed)
More frequency lately (but also hematuria and suspect uti)  May benefit from flomax or other agent  Lab Results  Component Value Date   PSA 2.22 06/11/2016   PSA 1.65 03/11/2014   PSA 1.86 01/20/2013   will address after eval and tx uti Harry Payne

## 2016-12-04 NOTE — Assessment & Plan Note (Signed)
Suspect due to rbc - see ua and micro  uti likely  cx pending Inc water (also concentrated urine)  Bactrim bid for 7 d  Update if no imp

## 2016-12-04 NOTE — Assessment & Plan Note (Signed)
Causing dark urine  Some voiding symptoms Suspect uti  cx pending tx with bactrim ds Enc much inc water intake  Pending result - urol consult if needed or no improvement

## 2016-12-04 NOTE — Progress Notes (Signed)
Subjective:    Patient ID: Harry Payne, male    DOB: 08-26-1951, 65 y.o.   MRN: [id number]  HPI Here for dark urine  Started last Thursday   Several days before that did not drink fluids (no desire to drink) Thinks he may have been dehydrated   Then drank 4 bottles of water and it cleared   Urine turned dark again today  Yesterday did not drink much fluid until afternoon  Today a little coffee and very little water   No urinary pain  No burning  No flank pain  Some urine frequency today  Some urgency - but then not much volume  No bladder pain   Lab Results  Component Value Date   PSA 2.22 06/11/2016   PSA 1.65 03/11/2014   PSA 1.86 01/20/2013       Wt Readings from Last 3 Encounters:  12/04/16 222 lb (100.7 kg)  08/27/16 220 lb (99.8 kg)  06/25/16 218 lb 3.2 oz (99 kg)    Results for orders placed or performed in visit on 12/04/16  POCT Urinalysis Dipstick (Automated)  Result Value Ref Range   Color, UA Brown    Clarity, UA Cloudy    Glucose, UA 100 mg/dL    Bilirubin, UA Negative    Ketones, UA Negative    Spec Grav, UA >=1.030    Blood, UA 200 Ery/uL (Large)    pH, UA 5.5    Protein, UA 30 mg/dL    Urobilinogen, UA 0.2    Nitrite, UA Negative    Leukocytes, UA Trace (A) Negative    Results for orders placed or performed in visit on 12/04/16  POCT Urinalysis Dipstick (Automated)  Result Value Ref Range   Color, UA Brown    Clarity, UA Cloudy    Glucose, UA 100 mg/dL    Bilirubin, UA Negative    Ketones, UA Negative    Spec Grav, UA >=1.030    Blood, UA 200 Ery/uL (Large)    pH, UA 5.5    Protein, UA 30 mg/dL    Urobilinogen, UA 0.2    Nitrite, UA Negative    Leukocytes, UA Trace (A) Negative  POCT UA - Microscopic Only  Result Value Ref Range   WBC, Ur, HPF, POC 3-10    RBC, urine, microscopic TMTC    Bacteria, U Microscopic mod    Mucus, UA few    Epithelial cells, urine per micros few    Crystals, Ur, HPF, POC one    Casts, Ur,  LPF, POC one    Yeast, UA none     Patient Active Problem List   Diagnosis Date Noted  . Hematuria 12/04/2016  . Dark urine 12/04/2016  . BPH (benign prostatic hyperplasia) 12/04/2016  . Hx of adenomatous colonic polyps 08/29/2016  . Long term current use of antithrombotics/antiplatelets - clopidogrel and ASA 06/13/2016  . Routine general medical examination at a health care facility 06/06/2016  . Prostate cancer screening 06/06/2016  . Blood pressure instability 04/03/2016  . Abdominal pain 03/25/2016  . Hypoxia 03/25/2016  . Steatosis, liver 03/25/2016  . Chronic systolic CHF (congestive heart failure) ([place]) 02/26/2016  . Old anterior myocardial infarction 02/26/2016  . CAD (coronary artery disease), native coronary artery   . AICD (automatic cardioverter/defibrillator) present   . Ischemic cardiomyopathy 06/14/2015  . Obesity 03/25/2012  . Non-insulin dependent type 2 diabetes mellitus ([place])   . Hyperlipidemia 02/09/2008  . GERD 02/09/2008   Past Medical History:  Diagnosis Date  . AICD (automatic cardioverter/defibrillator) present   . Anterior myocardial infarction [facility]) 2004 X 2  . CAD (coronary artery disease)   . CHF (congestive heart failure) ([place])   . Cholecystitis 03/25/2016  . GERD (gastroesophageal reflux disease)   . Hemorrhoids   . Hx of adenomatous colonic polyps   . Hyperlipidemia   . Hypertension   . Hypotension   . Type II diabetes mellitus ([place])    Past Surgical History:  Procedure Laterality Date  . CARDIAC CATHETERIZATION  2012   "no stent"  . CARDIAC CATHETERIZATION N/A 02/27/2016   Procedure: Left Heart Cath and Coronary Angiography;  Surgeon: Peter M Martinique, MD;  Location: [place] [facility];  Service: Cardiovascular;  Laterality: N/A;  . CHOLECYSTECTOMY N/A 03/27/2016   Procedure: LAPAROSCOPIC CHOLECYSTECTOMY WITH INTRAOPERATIVE CHOLANGIOGRAM;  Surgeon: Judeth Horn, MD;  Location: [place];  Service: General;  Laterality: N/A;  . CLOSED REDUCTION  HAND FRACTURE Left 1990  . COLONOSCOPY    . COLONOSCOPY W/ BIOPSIES    . CORONARY ANGIOPLASTY WITH STENT PLACEMENT  2004   "2"  . EP IMPLANTABLE DEVICE N/A 06/14/2015   Procedure: ICD Implant;  Surgeon: Deboraha Sprang, MD;  Location: [place] [facility];  Service: Cardiovascular;  Laterality: N/A;   Social History  Substance Use Topics  . Smoking status: Former Smoker    Packs/day: 2.50    Years: 37.00    Types: Cigarettes    Quit date: 12/30/2001  . Smokeless tobacco: Never Used  . Alcohol use No   Family History  Problem Relation Age of Onset  . Prostate cancer Father   . Diabetes Brother     x3  . Lung cancer Brother   . Heart disease Brother   . Colon cancer Neg Hx   . Esophageal cancer Neg Hx   . Rectal cancer Neg Hx   . Stomach cancer Neg Hx    Allergies  Allergen Reactions  . Morphine And Related Other (See Comments)    Tachycardia, goes crazy   Current Outpatient Prescriptions on File Prior to Visit  Medication Sig Dispense Refill  . acetaminophen (TYLENOL) 500 MG tablet Take 500 mg by mouth every 6 (six) hours as needed for mild pain or moderate pain.    Marland Kitchen aspirin 81 MG tablet Take 81 mg by mouth daily.     Marland Kitchen atorvastatin (LIPITOR) 80 MG tablet TAKE 1 TABLET (80 MG TOTAL) BY MOUTH DAILY. 30 tablet 3  . carvedilol (COREG) 25 MG tablet Take 0.5 tablets (12.5 mg total) by mouth 2 (two) times daily with a meal. 90 tablet 6  . clopidogrel (PLAVIX) 75 MG tablet TAKE 1 TABLET (75 MG TOTAL) BY MOUTH DAILY. 30 tablet 6  . glipiZIDE (GLUCOTROL XL) 5 MG 24 hr tablet TAKE 1 TABLET (5 MG TOTAL) BY MOUTH DAILY WITH BREAKFAST. 90 tablet 3  . metFORMIN (GLUCOPHAGE) 1000 MG tablet TAKE 1 TABLET (1,000 MG TOTAL) BY MOUTH 2 (TWO) TIMES DAILY WITH A MEAL. 60 tablet 5  . NITROSTAT 0.4 MG SL tablet PLACE 1 TABLET (0.4 MG TOTAL) UNDER THE TONGUE EVERY 5 (FIVE) MINUTES AS NEEDED. 25 tablet 4  . Omega-3 Fatty Acids (FISH OIL) 1000 MG CAPS Take 1 capsule by mouth daily.     . ONE TOUCH ULTRA  TEST test strip CHECK BLOOD SUGAR ONCE DAY AS NEEDED FOR DM E11.9 100 each 1  . ramipril (ALTACE) 2.5 MG capsule Take 1 capsule (2.5 mg total) by mouth daily.  90 capsule 3  . ranitidine (ZANTAC) 150 MG tablet TAKE 1 TABLET (150 MG TOTAL) BY MOUTH 2 (TWO) TIMES DAILY. 180 tablet 1  . spironolactone (ALDACTONE) 25 MG tablet Take 0.5 tablets (12.5 mg total) by mouth daily. 45 tablet 3  . vitamin B-12 (CYANOCOBALAMIN) 1000 MCG tablet Take 1,000 mcg by mouth daily.      . [DISCONTINUED] pantoprazole (PROTONIX) 40 MG tablet Take 1 tablet (40 mg total) by mouth daily. 30 tablet 11   Current Facility-Administered Medications on File Prior to Visit  Medication Dose Route Frequency Provider Last Rate Last Dose  . 0.9 %  sodium chloride infusion  500 mL Intravenous Continuous Gatha Mayer, MD        Review of Systems Review of Systems  Constitutional: Negative for fever, appetite change, fatigue and unexpected weight change. pos for poor intake of flids  Eyes: Negative for pain and visual disturbance.  Respiratory: Negative for cough and shortness of breath.   Cardiovascular: Negative for cp or palpitations    Gastrointestinal: Negative for nausea, diarrhea and constipation.  Genitourinary: pos for urgency and frequency. neg for incontinence/ pos for nocturia neg for dysuria or flank pain  Skin: Negative for pallor or rash   Neurological: Negative for weakness, light-headedness, numbness and headaches.  Hematological: Negative for adenopathy. Does not bruise/bleed easily.  Psychiatric/Behavioral: Negative for dysphoric mood. The patient is  nervous/anxious.  (pos for grief after just loosing brother to lung cancer)       Objective:   Physical Exam  Constitutional: He appears well-developed and well-nourished. No distress.  obese and well appearing   HENT:  Head: Normocephalic and atraumatic.  Mouth/Throat: Oropharynx is clear and moist.  Eyes: Conjunctivae and EOM are normal. Pupils are  equal, round, and reactive to light. No scleral icterus.  Neck: Normal range of motion. Neck supple.  Cardiovascular: Normal rate, regular rhythm and normal heart sounds.   Pulmonary/Chest: Effort normal and breath sounds normal.  No crackles   Abdominal: Soft. Bowel sounds are normal. He exhibits no distension and no mass. There is no tenderness. There is no rebound and no guarding.  No cva tenderness  No  suprapubic tenderness  Musculoskeletal: He exhibits no edema.  Lymphadenopathy:    He has no cervical adenopathy.  Neurological: He is alert.  Skin: No rash noted.  Psychiatric: His speech is normal and behavior is normal. Thought content normal. His mood appears anxious. He does not exhibit a depressed mood.  Somewhat anxious - disc loss of his brother recently          Assessment & Plan:   Problem List Items Addressed This Visit      Genitourinary   BPH (benign prostatic hyperplasia)    More frequency lately (but also hematuria and suspect uti)  May benefit from flomax or other agent  Lab Results  Component Value Date   PSA 2.22 06/11/2016   PSA 1.65 03/11/2014   PSA 1.86 01/20/2013   will address after eval and tx uti /hematuria         Other   Hematuria    Causing dark urine  Some voiding symptoms Suspect uti  cx pending tx with bactrim ds Enc much inc water intake  Pending result - urol consult if needed or no improvement      Relevant Orders   Urine culture   POCT UA - Microscopic Only (Completed)   Dark urine - Primary    Suspect due to rbc -  see ua and micro  uti likely  cx pending Inc water (also concentrated urine)  Bactrim bid for 7 d  Update if no imp       Relevant Orders   POCT Urinalysis Dipstick (Automated) (Completed)   POCT UA - Microscopic Only (Completed)

## 2016-12-04 NOTE — Progress Notes (Signed)
Pre visit review using our clinic review tool, if applicable. No additional management support is needed unless otherwise documented below in the visit note. 

## 2016-12-04 NOTE — Patient Instructions (Signed)
Drink lots and lots of water  Also take the bactrim for presumed uti as directed We will contact you with culture result when it returns with further instructions

## 2016-12-05 LAB — URINE CULTURE: Organism ID, Bacteria: NO GROWTH

## 2016-12-08 ENCOUNTER — Telehealth: Payer: Self-pay | Admitting: Family Medicine

## 2016-12-08 DIAGNOSIS — R350 Frequency of micturition: Principal | ICD-10-CM

## 2016-12-08 DIAGNOSIS — R319 Hematuria, unspecified: Secondary | ICD-10-CM

## 2016-12-08 DIAGNOSIS — N401 Enlarged prostate with lower urinary tract symptoms: Secondary | ICD-10-CM

## 2016-12-08 NOTE — Telephone Encounter (Signed)
Ref done will route to [facility]

## 2016-12-08 NOTE — Telephone Encounter (Signed)
-----   Message from Pilar Grammes, [place] sent at 12/06/2016  3:08 PM EST ----- Spoke to pt. He said he is doing better. The urine is not as bad. He would like to go to [facility] in [place]

## 2016-12-10 ENCOUNTER — Other Ambulatory Visit (HOSPITAL_COMMUNITY): Payer: Self-pay | Admitting: Internal Medicine

## 2016-12-24 ENCOUNTER — Ambulatory Visit (INDEPENDENT_AMBULATORY_CARE_PROVIDER_SITE_OTHER): Payer: 59 | Admitting: *Deleted

## 2016-12-24 DIAGNOSIS — I255 Ischemic cardiomyopathy: Secondary | ICD-10-CM | POA: Diagnosis not present

## 2016-12-24 NOTE — Progress Notes (Signed)
Remote ICD transmission.   

## 2016-12-25 LAB — CUP PACEART REMOTE DEVICE CHECK
Battery Remaining Longevity: 130 mo
Brady Statistic RV Percent Paced: 0.01 %
HighPow Impedance: 91 Ohm
Implantable Lead Location: 753860
Lead Channel Impedance Value: 475 Ohm
Lead Channel Impedance Value: 532 Ohm
Lead Channel Setting Pacing Amplitude: 2.5 V
Lead Channel Setting Pacing Pulse Width: 0.4 ms
Lead Channel Setting Sensing Sensitivity: 0.3 mV
MDC IDC LEAD IMPLANT DT: 20160615
MDC IDC MSMT BATTERY VOLTAGE: 3.03 V
MDC IDC MSMT LEADCHNL RV PACING THRESHOLD AMPLITUDE: 0.5 V
MDC IDC MSMT LEADCHNL RV PACING THRESHOLD PULSEWIDTH: 0.4 ms
MDC IDC MSMT LEADCHNL RV SENSING INTR AMPL: 31.125 mV
MDC IDC MSMT LEADCHNL RV SENSING INTR AMPL: 31.125 mV
MDC IDC PG IMPLANT DT: 20160615
MDC IDC SESS DTM: 20171226072603

## 2016-12-27 ENCOUNTER — Encounter: Payer: Self-pay | Admitting: Cardiology

## 2017-01-06 ENCOUNTER — Other Ambulatory Visit: Payer: Self-pay | Admitting: Family Medicine

## 2017-01-10 ENCOUNTER — Encounter: Payer: Self-pay | Admitting: Cardiology

## 2017-02-22 ENCOUNTER — Other Ambulatory Visit: Payer: Self-pay | Admitting: Family Medicine

## 2017-03-24 ENCOUNTER — Other Ambulatory Visit (HOSPITAL_COMMUNITY): Payer: Self-pay | Admitting: Internal Medicine

## 2017-03-25 ENCOUNTER — Ambulatory Visit (INDEPENDENT_AMBULATORY_CARE_PROVIDER_SITE_OTHER): Payer: 59 | Admitting: *Deleted

## 2017-03-25 DIAGNOSIS — I255 Ischemic cardiomyopathy: Secondary | ICD-10-CM | POA: Diagnosis not present

## 2017-03-25 NOTE — Progress Notes (Signed)
Remote ICD transmission.   

## 2017-03-26 LAB — CUP PACEART REMOTE DEVICE CHECK
Battery Remaining Longevity: 128 mo
Battery Voltage: 3.03 V
Brady Statistic RV Percent Paced: 0.01 %
HighPow Impedance: 92 Ohm
Implantable Lead Implant Date: 20160615
Lead Channel Impedance Value: 456 Ohm
Lead Channel Impedance Value: 513 Ohm
Lead Channel Setting Pacing Amplitude: 2.5 V
Lead Channel Setting Sensing Sensitivity: 0.3 mV
MDC IDC LEAD LOCATION: 753860
MDC IDC MSMT LEADCHNL RV PACING THRESHOLD AMPLITUDE: 0.5 V
MDC IDC MSMT LEADCHNL RV PACING THRESHOLD PULSEWIDTH: 0.4 ms
MDC IDC MSMT LEADCHNL RV SENSING INTR AMPL: 29.875 mV
MDC IDC MSMT LEADCHNL RV SENSING INTR AMPL: 29.875 mV
MDC IDC PG IMPLANT DT: 20160615
MDC IDC SESS DTM: 20180327062723
MDC IDC SET LEADCHNL RV PACING PULSEWIDTH: 0.4 ms

## 2017-03-28 ENCOUNTER — Encounter: Payer: Self-pay | Admitting: Cardiology

## 2017-04-02 ENCOUNTER — Other Ambulatory Visit: Payer: Self-pay | Admitting: Family Medicine

## 2017-04-04 ENCOUNTER — Other Ambulatory Visit (HOSPITAL_COMMUNITY): Payer: Self-pay | Admitting: Cardiology

## 2017-04-04 MED ORDER — ATORVASTATIN CALCIUM 80 MG PO TABS: 80.0000 mg | ORAL_TABLET | Freq: Every day | ORAL | 0 refills | Status: DC

## 2017-04-24 ENCOUNTER — Other Ambulatory Visit: Payer: Self-pay | Admitting: Family Medicine

## 2017-04-24 NOTE — Telephone Encounter (Signed)
Please schedule f/u (30 min) with labs prior and refill until then

## 2017-04-24 NOTE — Telephone Encounter (Signed)
Appt scheduled and med refilled  

## 2017-04-24 NOTE — Telephone Encounter (Signed)
Pt had an acute appt recently but no recent f/u or CPE, and no recent labs, and no future appts., please advise

## 2017-05-22 ENCOUNTER — Other Ambulatory Visit: Payer: 59

## 2017-05-22 ENCOUNTER — Other Ambulatory Visit: Payer: Self-pay | Admitting: Family Medicine

## 2017-05-27 ENCOUNTER — Ambulatory Visit: Payer: 59 | Admitting: Family Medicine

## 2017-05-29 ENCOUNTER — Other Ambulatory Visit (HOSPITAL_COMMUNITY): Payer: Self-pay | Admitting: Cardiology

## 2017-06-11 ENCOUNTER — Other Ambulatory Visit (INDEPENDENT_AMBULATORY_CARE_PROVIDER_SITE_OTHER): Payer: 59

## 2017-06-11 DIAGNOSIS — E785 Hyperlipidemia, unspecified: Secondary | ICD-10-CM

## 2017-06-11 DIAGNOSIS — E119 Type 2 diabetes mellitus without complications: Secondary | ICD-10-CM

## 2017-06-11 LAB — COMPREHENSIVE METABOLIC PANEL
ALT: 29 U/L (ref 0–53)
AST: 19 U/L (ref 0–37)
Albumin: 4.3 g/dL (ref 3.5–5.2)
Alkaline Phosphatase: 52 U/L (ref 39–117)
BILIRUBIN TOTAL: 0.6 mg/dL (ref 0.2–1.2)
BUN: 14 mg/dL (ref 6–23)
CALCIUM: 9.8 mg/dL (ref 8.4–10.5)
CO2: 30 meq/L (ref 19–32)
CREATININE: 0.95 mg/dL (ref 0.40–1.50)
Chloride: 102 mEq/L (ref 96–112)
GFR: 84.47 mL/min (ref >60.00)
Glucose, Bld: 147 mg/dL — ABNORMAL HIGH (ref 70–99)
Potassium: 4.7 mEq/L (ref 3.5–5.1)
Sodium: 138 mEq/L (ref 135–145)
Total Protein: 6.9 g/dL (ref 6.0–8.3)

## 2017-06-11 LAB — LIPID PANEL
CHOL/HDL RATIO: 4
Cholesterol: 115 mg/dL (ref 0–200)
HDL: 25.9 mg/dL — AB (ref >39.00)
LDL CALC: 64 mg/dL (ref 0–99)
NONHDL: 88.68
Triglycerides: 125 mg/dL (ref 0.0–149.0)
VLDL: 25 mg/dL (ref 0.0–40.0)

## 2017-06-11 LAB — HEMOGLOBIN A1C: HEMOGLOBIN A1C: 6.9 % — AB (ref 4.6–6.5)

## 2017-06-13 ENCOUNTER — Encounter: Payer: Self-pay | Admitting: Family Medicine

## 2017-06-13 ENCOUNTER — Ambulatory Visit (INDEPENDENT_AMBULATORY_CARE_PROVIDER_SITE_OTHER): Payer: 59 | Admitting: Family Medicine

## 2017-06-13 DIAGNOSIS — E119 Type 2 diabetes mellitus without complications: Secondary | ICD-10-CM | POA: Diagnosis not present

## 2017-06-13 DIAGNOSIS — Z6830 Body mass index (BMI) 30.0-30.9, adult: Secondary | ICD-10-CM | POA: Diagnosis not present

## 2017-06-13 DIAGNOSIS — E6609 Other obesity due to excess calories: Secondary | ICD-10-CM

## 2017-06-13 DIAGNOSIS — E7800 Pure hypercholesterolemia, unspecified: Secondary | ICD-10-CM

## 2017-06-13 DIAGNOSIS — I5022 Chronic systolic (congestive) heart failure: Secondary | ICD-10-CM

## 2017-06-13 MED ORDER — METFORMIN HCL 1000 MG PO TABS: ORAL_TABLET | ORAL | 3 refills | Status: DC

## 2017-06-13 MED ORDER — GLIPIZIDE ER 5 MG PO TB24: ORAL_TABLET | ORAL | 3 refills | Status: DC

## 2017-06-13 MED ORDER — RANITIDINE HCL 150 MG PO TABS: ORAL_TABLET | ORAL | 3 refills | Status: DC

## 2017-06-13 NOTE — Patient Instructions (Addendum)
Take care of yourself   Work on healthy diet/exercise and weight loss   To raise HDL (good cholesterol) - exercise !  Your a1C is 6.9- I want to keep It below 7 -so keep working on it  Get your eye exam in august as planned  Follow up in 6 months for your annual exam

## 2017-06-13 NOTE — Assessment & Plan Note (Signed)
Lab Results  Component Value Date   HGBA1C 6.9 (H) 06/11/2017   This is up slt  Disc goal of getting A1C below 7 (better 6.5)  Plan to work harder on diet and exercise  Disc eye and foot care- eye appt scheduled for aug  F/u 6 mo

## 2017-06-13 NOTE — Progress Notes (Signed)
Subjective:    Patient ID: Harry Payne, male    DOB: 04/05/1951, 66 y.o.   MRN: [id number]  HPI Here for f/u of chronic health problems   Feeling ok   Wt Readings from Last 3 Encounters:  06/13/17 216 lb 12 oz (98.3 kg)  12/04/16 222 lb (100.7 kg)  08/27/16 220 lb (99.8 kg)  wt is down 6 lb  Not working on it as hard as he should be  Back in the gym 2 d per week and doing more incline with walking  bmi 30.2  BP Readings from Last 3 Encounters:  06/13/17 122/68  12/04/16 130/74  08/27/16 125/77   Hx of CAD and ischemic cardiomyopathy  Diabetes Home sugar results -no highs or lows in general DM diet -not great (a lot of events over the summer)  Exercise better/ at least 2 d per week and getting back to the gym Symptoms-none  A1C last  Lab Results  Component Value Date   HGBA1C 6.9 (H) 06/11/2017  this is up from 6.7  No problems with medications  Renal protection- low dose ace Last eye exam-he has it scheduled in august   ,no vision problems   Hx of hyperlipidemia Lab Results  Component Value Date   CHOL 115 06/11/2017   CHOL 119 06/11/2016   CHOL 115 12/08/2015   Lab Results  Component Value Date   HDL 25.90 (L) 06/11/2017   HDL 30.80 (L) 06/11/2016   HDL 29.40 (L) 12/08/2015   Lab Results  Component Value Date   LDLCALC 64 06/11/2017   LDLCALC 63 06/11/2016   LDLCALC 67 12/08/2015   Lab Results  Component Value Date   TRIG 125.0 06/11/2017   TRIG 126.0 06/11/2016   TRIG 94.0 12/08/2015   Lab Results  Component Value Date   CHOLHDL 4 06/11/2017   CHOLHDL 4 06/11/2016   CHOLHDL 4 12/08/2015   No results found for: LDLDIRECT Atorvastatin 80 mg  Looking pretty good     Chemistry      Component Value Date/Time   NA 138 06/11/2017 0820   K 4.7 06/11/2017 0820   CL 102 06/11/2017 0820   CO2 30 06/11/2017 0820   BUN 14 06/11/2017 0820   CREATININE 0.95 06/11/2017 0820      Component Value Date/Time   CALCIUM 9.8 06/11/2017 0820   ALKPHOS 52 06/11/2017 0820   AST 19 06/11/2017 0820   ALT 29 06/11/2017 0820   BILITOT 0.6 06/11/2017 0820     glucose 147   Patient Active Problem List   Diagnosis Date Noted  . Hematuria 12/04/2016  . Dark urine 12/04/2016  . BPH (benign prostatic hyperplasia) 12/04/2016  . Hx of adenomatous colonic polyps 08/29/2016  . Long term current use of antithrombotics/antiplatelets - clopidogrel and ASA 06/13/2016  . Routine general medical examination at a health care facility 06/06/2016  . Prostate cancer screening 06/06/2016  . Blood pressure instability 04/03/2016  . Abdominal pain 03/25/2016  . Hypoxia 03/25/2016  . Steatosis, liver 03/25/2016  . Old anterior myocardial infarction 02/26/2016  . CAD (coronary artery disease), native coronary artery   . AICD (automatic cardioverter/defibrillator) present   . Ischemic cardiomyopathy 06/14/2015  . Obesity 03/25/2012  . Non-insulin dependent type 2 diabetes mellitus ([place])   . Hyperlipidemia 02/09/2008  . GERD 02/09/2008   Past Medical History:  Diagnosis Date  . AICD (automatic cardioverter/defibrillator) present   . Anterior myocardial infarction [facility]) 2004 X 2  . CAD (coronary artery  disease)   . CHF (congestive heart failure) ([place])   . Cholecystitis 03/25/2016  . GERD (gastroesophageal reflux disease)   . Hemorrhoids   . Hx of adenomatous colonic polyps   . Hyperlipidemia   . Hypertension   . Hypotension   . Type II diabetes mellitus ([place])    Past Surgical History:  Procedure Laterality Date  . CARDIAC CATHETERIZATION  2012   "no stent"  . CARDIAC CATHETERIZATION N/A 02/27/2016   Procedure: Left Heart Cath and Coronary Angiography;  Surgeon: Peter M Martinique, MD;  Location: [place] [facility];  Service: Cardiovascular;  Laterality: N/A;  . CHOLECYSTECTOMY N/A 03/27/2016   Procedure: LAPAROSCOPIC CHOLECYSTECTOMY WITH INTRAOPERATIVE CHOLANGIOGRAM;  Surgeon: Judeth Horn, MD;  Location: [place];  Service: General;  Laterality:  N/A;  . CLOSED REDUCTION HAND FRACTURE Left 1990  . COLONOSCOPY    . COLONOSCOPY W/ BIOPSIES    . CORONARY ANGIOPLASTY WITH STENT PLACEMENT  2004   "2"  . EP IMPLANTABLE DEVICE N/A 06/14/2015   Procedure: ICD Implant;  Surgeon: Deboraha Sprang, MD;  Location: [place] [facility];  Service: Cardiovascular;  Laterality: N/A;   Social History  Substance Use Topics  . Smoking status: Former Smoker    Packs/day: 2.50    Years: 37.00    Types: Cigarettes    Quit date: 12/30/2001  . Smokeless tobacco: Never Used  . Alcohol use No   Family History  Problem Relation Age of Onset  . Prostate cancer Father   . Diabetes Brother        x3  . Lung cancer Brother   . Heart disease Brother   . Colon cancer Neg Hx   . Esophageal cancer Neg Hx   . Rectal cancer Neg Hx   . Stomach cancer Neg Hx    Allergies  Allergen Reactions  . Morphine And Related Other (See Comments)    Tachycardia   Current Outpatient Prescriptions on File Prior to Visit  Medication Sig Dispense Refill  . acetaminophen (TYLENOL) 500 MG tablet Take 500 mg by mouth every 6 (six) hours as needed for mild pain or moderate pain.    Marland Kitchen aspirin 81 MG tablet Take 81 mg by mouth daily.     Marland Kitchen atorvastatin (LIPITOR) 80 MG tablet Take 1 tablet (80 mg total) by mouth daily at 6 PM. NEEDS OFFICE VISIT 90 tablet 0  . carvedilol (COREG) 25 MG tablet Take 0.5 tablets (12.5 mg total) by mouth 2 (two) times daily with a meal. 90 tablet 6  . clopidogrel (PLAVIX) 75 MG tablet TAKE 1 TABLET (75 MG TOTAL) BY MOUTH DAILY. 30 tablet 2  . NITROSTAT 0.4 MG SL tablet PLACE 1 TABLET (0.4 MG TOTAL) UNDER THE TONGUE EVERY 5 (FIVE) MINUTES AS NEEDED. 25 tablet 4  . Omega-3 Fatty Acids (FISH OIL) 1000 MG CAPS Take 1 capsule by mouth daily.     . ONE TOUCH ULTRA TEST test strip CHECK BLOOD SUGAR ONCE DAY AS NEEDED FOR DM E11.9 100 each 1  . ramipril (ALTACE) 2.5 MG capsule Take 1 capsule (2.5 mg total) by mouth daily. 90 capsule 3  . spironolactone  (ALDACTONE) 25 MG tablet TAKE 0.5 TABLETS (12.5 MG TOTAL) BY MOUTH DAILY. 45 tablet 3  . vitamin B-12 (CYANOCOBALAMIN) 1000 MCG tablet Take 1,000 mcg by mouth daily.      . [DISCONTINUED] pantoprazole (PROTONIX) 40 MG tablet Take 1 tablet (40 mg total) by mouth daily. 30 tablet 11   Current Facility-Administered Medications  on File Prior to Visit  Medication Dose Route Frequency Provider Last Rate Last Dose  . 0.9 %  sodium chloride infusion  500 mL Intravenous Continuous Gatha Mayer, MD        Review of Systems    Review of Systems  Constitutional: Negative for fever, appetite change, fatigue and unexpected weight change.  Eyes: Negative for pain and visual disturbance.  Respiratory: Negative for cough and shortness of breath.   Cardiovascular: Negative for cp or palpitations    Gastrointestinal: Negative for nausea, diarrhea and constipation.  Genitourinary: Negative for urgency and frequency.  Skin: Negative for pallor or rash   Neurological: Negative for weakness, light-headedness, numbness and headaches.  Hematological: Negative for adenopathy. Does not bruise/bleed easily.  Psychiatric/Behavioral: Negative for dysphoric mood. The patient is not nervous/anxious.      Objective:   Physical Exam  Constitutional: He appears well-developed and well-nourished. No distress.  obese and well appearing   HENT:  Head: Normocephalic and atraumatic.  Mouth/Throat: Oropharynx is clear and moist.  Eyes: Conjunctivae and EOM are normal. Pupils are equal, round, and reactive to light.  Neck: Normal range of motion. Neck supple. No JVD present. Carotid bruit is not present. No thyromegaly present.  Cardiovascular: Normal rate, regular rhythm, normal heart sounds and intact distal pulses.  Exam reveals no gallop.   Pulmonary/Chest: Effort normal and breath sounds normal. No respiratory distress. He has no wheezes. He has no rales.  No crackles  Abdominal: Soft. Bowel sounds are normal. He  exhibits no distension, no abdominal bruit and no mass. There is no tenderness.  Musculoskeletal: He exhibits no edema.  Lymphadenopathy:    He has no cervical adenopathy.  Neurological: He is alert. He has normal reflexes.  Skin: Skin is warm and dry. No rash noted.  Psychiatric: He has a normal mood and affect.          Assessment & Plan:   Problem List Items Addressed This Visit      Cardiovascular and Mediastinum   RESOLVED: Chronic systolic CHF (congestive heart failure) (H[facility]) - Primary (Chronic)     Endocrine   Non-insulin dependent type 2 diabetes mellitus ([facility]) (Chronic)    Lab Results  Component Value Date   HGBA1C 6.9 (H) 06/11/2017   This is up slt  Disc goal of getting A1C below 7 (better 6.5)  Plan to work harder on diet and exercise  Disc eye and foot care- eye appt scheduled for aug  F/u 6 mo       Relevant Medications   glipiZIDE (GLUCOTROL XL) 5 MG 24 hr tablet   metFORMIN (GLUCOPHAGE) 1000 MG tablet     Other   Hyperlipidemia (Chronic)    Disc goals for lipids and reasons to control them Rev labs with pt Rev low sat fat diet in detail Good LDL control on statin  HDL low- disc exercise and fish consumption to help this        Obesity (Chronic)    Discussed how this problem influences overall health and the risks it imposes  Reviewed plan for weight loss with lower calorie diet (via better food choices and also portion control or program like weight watchers) and exercise building up to or more than 30 minutes 5 days per week including some aerobic activity   Commended wt loss so far      Relevant Medications   glipiZIDE (GLUCOTROL XL) 5 MG 24 hr tablet   metFORMIN (GLUCOPHAGE) 1000 MG tablet

## 2017-06-13 NOTE — Assessment & Plan Note (Signed)
Disc goals for lipids and reasons to control them Rev labs with pt Rev low sat fat diet in detail Good LDL control on statin  HDL low- disc exercise and fish consumption to help this

## 2017-06-13 NOTE — Assessment & Plan Note (Signed)
Discussed how this problem influences overall health and the risks it imposes  °Reviewed plan for weight loss with lower calorie diet (via better food choices and also portion control or program like weight watchers) and exercise building up to or more than 30 minutes 5 days per week including some aerobic activity  ° °Commended wt loss so far  °

## 2017-06-23 ENCOUNTER — Other Ambulatory Visit (HOSPITAL_COMMUNITY): Payer: Self-pay | Admitting: Internal Medicine

## 2017-06-24 ENCOUNTER — Ambulatory Visit (INDEPENDENT_AMBULATORY_CARE_PROVIDER_SITE_OTHER): Payer: 59 | Admitting: *Deleted

## 2017-06-24 DIAGNOSIS — I255 Ischemic cardiomyopathy: Secondary | ICD-10-CM | POA: Diagnosis not present

## 2017-06-24 NOTE — Progress Notes (Signed)
Remote ICD transmission.   

## 2017-06-25 ENCOUNTER — Encounter: Payer: Self-pay | Admitting: Cardiology

## 2017-06-25 LAB — CUP PACEART REMOTE DEVICE CHECK
Battery Remaining Longevity: 126 mo
HIGH POWER IMPEDANCE MEASURED VALUE: 88 Ohm
Implantable Lead Implant Date: 20160615
Implantable Pulse Generator Implant Date: 20160615
Lead Channel Impedance Value: 456 Ohm
Lead Channel Pacing Threshold Amplitude: 0.375 V
Lead Channel Pacing Threshold Pulse Width: 0.4 ms
Lead Channel Setting Pacing Amplitude: 2.5 V
Lead Channel Setting Pacing Pulse Width: 0.4 ms
Lead Channel Setting Sensing Sensitivity: 0.3 mV
MDC IDC LEAD LOCATION: 753860
MDC IDC MSMT BATTERY VOLTAGE: 3.02 V
MDC IDC MSMT LEADCHNL RV IMPEDANCE VALUE: 532 Ohm
MDC IDC MSMT LEADCHNL RV SENSING INTR AMPL: 27.125 mV
MDC IDC MSMT LEADCHNL RV SENSING INTR AMPL: 27.125 mV
MDC IDC SESS DTM: 20180626052304
MDC IDC STAT BRADY RV PERCENT PACED: 0.01 %

## 2017-07-01 ENCOUNTER — Encounter: Payer: Self-pay | Admitting: Internal Medicine

## 2017-07-01 ENCOUNTER — Ambulatory Visit (INDEPENDENT_AMBULATORY_CARE_PROVIDER_SITE_OTHER): Payer: 59 | Admitting: Internal Medicine

## 2017-07-01 VITALS — BP 120/70 | HR 88 | Ht 71.0 in | Wt 217.0 lb

## 2017-07-01 DIAGNOSIS — Z95810 Presence of automatic (implantable) cardiac defibrillator: Secondary | ICD-10-CM | POA: Diagnosis not present

## 2017-07-01 DIAGNOSIS — I5022 Chronic systolic (congestive) heart failure: Secondary | ICD-10-CM

## 2017-07-01 DIAGNOSIS — I255 Ischemic cardiomyopathy: Secondary | ICD-10-CM | POA: Diagnosis not present

## 2017-07-01 LAB — CUP PACEART INCLINIC DEVICE CHECK
Battery Remaining Longevity: 126 mo
Battery Voltage: 3.02 V
Brady Statistic RV Percent Paced: 0.01 %
Date Time Interrogation Session: 20180703164958
HighPow Impedance: 90 Ohm
Implantable Lead Location: 753860
Lead Channel Impedance Value: 551 Ohm
Lead Channel Setting Pacing Amplitude: 2.5 V
Lead Channel Setting Pacing Pulse Width: 0.4 ms
Lead Channel Setting Sensing Sensitivity: 0.3 mV
MDC IDC LEAD IMPLANT DT: 20160615
MDC IDC MSMT LEADCHNL RV IMPEDANCE VALUE: 513 Ohm
MDC IDC MSMT LEADCHNL RV PACING THRESHOLD AMPLITUDE: 0.5 V
MDC IDC MSMT LEADCHNL RV PACING THRESHOLD PULSEWIDTH: 0.4 ms
MDC IDC MSMT LEADCHNL RV SENSING INTR AMPL: 28 mV
MDC IDC MSMT LEADCHNL RV SENSING INTR AMPL: 31.625 mV
MDC IDC PG IMPLANT DT: 20160615

## 2017-07-01 NOTE — Patient Instructions (Signed)

## 2017-07-01 NOTE — Progress Notes (Signed)
Patient Care Team: Tower, Wynelle Fanny, MD as PCP - General   HPI  Harry Payne is a 66 y.o. male  Seen in follow-up for an ICD implanted for primary prevention in 6/16.   He has a history of CAD s/p anterior MI 2004 (s/p stenting LAD and LCX), DM2, and hyperlipidemia (previously in AIM-HIGH trial). EF 48% by MR. Myoview 2009 EF 43%. anterior apical scar. no ischemia.    Echo repeated 5/16 EF 30%  LHC 02/27/2016  Prox Cx lesion, 35% stenosed. LAD stent open  There is moderate left ventricular systolic dysfunction. 1. Nonobstructive CAD 2. Moderate LV dysfunction 3. No significant change since 2012.   ECHO 01/2016: EF 35-40%. Grade I DD  The patient denies chest pain, shortness of breath, nocturnal dyspnea, orthopnea or peripheral edema.  There have been no palpitations, lightheadedness or syncope.   Date Cr K Hgb       15.5   6/18 0.95 4.7        Past Medical History:  Diagnosis Date  . AICD (automatic cardioverter/defibrillator) present   . Anterior myocardial infarction [facility]) 2004 X 2  . CAD (coronary artery disease)   . CHF (congestive heart failure) ([place])   . Cholecystitis 03/25/2016  . GERD (gastroesophageal reflux disease)   . Hemorrhoids   . Hx of adenomatous colonic polyps   . Hyperlipidemia   . Hypertension   . Hypotension   . Type II diabetes mellitus ([place])     Past Surgical History:  Procedure Laterality Date  . CARDIAC CATHETERIZATION  2012   "no stent"  . CARDIAC CATHETERIZATION N/A 02/27/2016   Procedure: Left Heart Cath and Coronary Angiography;  Surgeon: Peter M Martinique, MD;  Location: [place] [facility];  Service: Cardiovascular;  Laterality: N/A;  . CHOLECYSTECTOMY N/A 03/27/2016   Procedure: LAPAROSCOPIC CHOLECYSTECTOMY WITH INTRAOPERATIVE CHOLANGIOGRAM;  Surgeon: Judeth Horn, MD;  Location: [place];  Service: General;  Laterality: N/A;  . CLOSED REDUCTION HAND FRACTURE Left 1990  . COLONOSCOPY    . COLONOSCOPY W/ BIOPSIES    . CORONARY  ANGIOPLASTY WITH STENT PLACEMENT  2004   "2"  . EP IMPLANTABLE DEVICE N/A 06/14/2015   Procedure: ICD Implant;  Surgeon: Deboraha Sprang, MD;  Location: [place] [facility];  Service: Cardiovascular;  Laterality: N/A;    Current Outpatient Prescriptions  Medication Sig Dispense Refill  . acetaminophen (TYLENOL) 500 MG tablet Take 500 mg by mouth every 6 (six) hours as needed for mild pain or moderate pain.    Marland Kitchen aspirin 81 MG tablet Take 81 mg by mouth daily.     Marland Kitchen atorvastatin (LIPITOR) 80 MG tablet Take 1 tablet (80 mg total) by mouth daily at 6 PM. NEEDS OFFICE VISIT 90 tablet 0  . carvedilol (COREG) 25 MG tablet Take 0.5 tablets (12.5 mg total) by mouth 2 (two) times daily with a meal. 90 tablet 6  . clopidogrel (PLAVIX) 75 MG tablet Take 1 tablet (75 mg total) by mouth daily. Needs office visit 30 tablet 1  . glipiZIDE (GLUCOTROL XL) 5 MG 24 hr tablet TAKE 1 TABLET (5 MG TOTAL) BY MOUTH DAILY WITH BREAKFAST. 90 tablet 3  . metFORMIN (GLUCOPHAGE) 1000 MG tablet TAKE 1 TABLET (1,000 MG TOTAL) BY MOUTH 2 (TWO) TIMES DAILY WITH A MEAL. 180 tablet 3  . NITROSTAT 0.4 MG SL tablet PLACE 1 TABLET (0.4 MG TOTAL) UNDER THE TONGUE EVERY 5 (FIVE) MINUTES AS NEEDED. 25 tablet 4  .  Omega-3 Fatty Acids (FISH OIL) 1000 MG CAPS Take 1 capsule by mouth daily.     . ONE TOUCH ULTRA TEST test strip CHECK BLOOD SUGAR ONCE DAY AS NEEDED FOR DM E11.9 100 each 1  . ramipril (ALTACE) 2.5 MG capsule Take 1 capsule (2.5 mg total) by mouth daily. 90 capsule 3  . ranitidine (ZANTAC) 150 MG tablet TAKE 1 TABLET (150 MG TOTAL) BY MOUTH 2 (TWO) TIMES DAILY. 180 tablet 3  . spironolactone (ALDACTONE) 25 MG tablet TAKE 0.5 TABLETS (12.5 MG TOTAL) BY MOUTH DAILY. 45 tablet 3  . vitamin B-12 (CYANOCOBALAMIN) 1000 MCG tablet Take 1,000 mcg by mouth daily.       Current Facility-Administered Medications  Medication Dose Route Frequency Provider Last Rate Last Dose  . 0.9 %  sodium chloride infusion  500 mL Intravenous  Continuous Gatha Mayer, MD        Allergies  Allergen Reactions  . Morphine And Related Other (See Comments)    Tachycardia      Review of Systems negative except from HPI and PMH  Physical Exam BP 120/70   Pulse 88   Ht 5\' 11"  (1.803 m)   Wt 217 lb (98.4 kg)   SpO2 98%   BMI 30.27 kg/m  Well developed and nourished in no acute distress HENT normal Neck supple with JVP-flat Carotids brisk and full without bruits Clear Device pocket well healed; without hematoma or erythema.  There is no tethering  Regular rate and rhythm, no murmurs or gallops Abd-soft with active BS without hepatomegaly No Clubbing cyanosis edema Skin-warm and dry A & Oriented  Grossly normal sensory and motor function  ECG dated today demonstrates sinus rhythm at 85 Intervals 16/0736 Axis is 39 Prior anteroseptal MI  Assessment and  Plan  Ischemic cardiomyopathy  Congestive heart failure-chronic-systolic-class II  Implantable defibrillator-[employer]tronic-single chamber   Without symptoms of ischemia  Euvolemic continue current meds

## 2017-07-11 ENCOUNTER — Other Ambulatory Visit (HOSPITAL_COMMUNITY): Payer: Self-pay | Admitting: *Deleted

## 2017-07-11 MED ORDER — RAMIPRIL 2.5 MG PO CAPS: 2.5000 mg | ORAL_CAPSULE | Freq: Every day | ORAL | 1 refills | Status: DC

## 2017-08-04 ENCOUNTER — Other Ambulatory Visit (HOSPITAL_COMMUNITY): Payer: Self-pay | Admitting: Internal Medicine

## 2017-08-28 ENCOUNTER — Other Ambulatory Visit (HOSPITAL_COMMUNITY): Payer: Self-pay | Admitting: Internal Medicine

## 2017-09-04 ENCOUNTER — Encounter (HOSPITAL_COMMUNITY): Payer: Self-pay | Admitting: Internal Medicine

## 2017-09-04 ENCOUNTER — Ambulatory Visit (HOSPITAL_COMMUNITY)
Admission: RE | Admit: 2017-09-04 | Discharge: 2017-09-04 | Disposition: A | Discharge status: Home / Self Care | Payer: 59 | Source: Ambulatory Visit | Attending: Internal Medicine | Admitting: Internal Medicine

## 2017-09-04 VITALS — BP 120/82 | HR 84 | Wt 218.6 lb

## 2017-09-04 DIAGNOSIS — N529 Male erectile dysfunction, unspecified: Secondary | ICD-10-CM | POA: Insufficient documentation

## 2017-09-04 DIAGNOSIS — I110 Hypertensive heart disease with heart failure: Secondary | ICD-10-CM | POA: Diagnosis present

## 2017-09-04 DIAGNOSIS — E785 Hyperlipidemia, unspecified: Secondary | ICD-10-CM | POA: Diagnosis not present

## 2017-09-04 DIAGNOSIS — I10 Essential (primary) hypertension: Secondary | ICD-10-CM | POA: Diagnosis not present

## 2017-09-04 DIAGNOSIS — I959 Hypotension, unspecified: Secondary | ICD-10-CM | POA: Insufficient documentation

## 2017-09-04 DIAGNOSIS — R109 Unspecified abdominal pain: Secondary | ICD-10-CM | POA: Diagnosis not present

## 2017-09-04 DIAGNOSIS — K219 Gastro-esophageal reflux disease without esophagitis: Secondary | ICD-10-CM | POA: Diagnosis not present

## 2017-09-04 DIAGNOSIS — I5022 Chronic systolic (congestive) heart failure: Secondary | ICD-10-CM | POA: Insufficient documentation

## 2017-09-04 DIAGNOSIS — I2510 Atherosclerotic heart disease of native coronary artery without angina pectoris: Secondary | ICD-10-CM | POA: Diagnosis not present

## 2017-09-04 DIAGNOSIS — I252 Old myocardial infarction: Secondary | ICD-10-CM | POA: Diagnosis not present

## 2017-09-04 DIAGNOSIS — E119 Type 2 diabetes mellitus without complications: Secondary | ICD-10-CM | POA: Diagnosis not present

## 2017-09-04 DIAGNOSIS — Z7982 Long term (current) use of aspirin: Secondary | ICD-10-CM | POA: Diagnosis not present

## 2017-09-04 DIAGNOSIS — Z95810 Presence of automatic (implantable) cardiac defibrillator: Secondary | ICD-10-CM | POA: Diagnosis not present

## 2017-09-04 DIAGNOSIS — Z9049 Acquired absence of other specified parts of digestive tract: Secondary | ICD-10-CM | POA: Insufficient documentation

## 2017-09-04 DIAGNOSIS — Z7984 Long term (current) use of oral hypoglycemic drugs: Secondary | ICD-10-CM | POA: Insufficient documentation

## 2017-09-04 DIAGNOSIS — Z7902 Long term (current) use of antithrombotics/antiplatelets: Secondary | ICD-10-CM | POA: Diagnosis not present

## 2017-09-04 DIAGNOSIS — I255 Ischemic cardiomyopathy: Secondary | ICD-10-CM

## 2017-09-04 LAB — BASIC METABOLIC PANEL
ANION GAP: 10 (ref 5–15)
BUN: 14 mg/dL (ref 6–20)
CO2: 23 mmol/L (ref 22–32)
Calcium: 9.9 mg/dL (ref 8.9–10.3)
Chloride: 105 mmol/L (ref 101–111)
Creatinine, Ser: 1.09 mg/dL (ref 0.61–1.24)
GFR calc non Af Amer: >60 mL/min (ref >60)
GLUCOSE: 157 mg/dL — AB (ref 65–99)
POTASSIUM: 4.1 mmol/L (ref 3.5–5.1)
SODIUM: 138 mmol/L (ref 135–145)

## 2017-09-04 MED ORDER — SACUBITRIL-VALSARTAN 24-26 MG PO TABS: 1.0000 | ORAL_TABLET | Freq: Two times a day (BID) | ORAL | 6 refills | Status: DC

## 2017-09-04 NOTE — Patient Instructions (Signed)
STOP Ramipril (Altace).  START Entresto 24/26 mg tablet twice daily starting on Saturday 09/06/2017. Use 30 day free card first, then use copay card each month.  Routine lab work today. Will notify you of abnormal results, otherwise no news is good news!  Follow up 4 months with Dr. Haroldine Laws. Take all medication as prescribed the day of your appointment. Bring all medications with you to your appointment.  Do the following things EVERYDAY: 1) Weigh yourself in the morning before breakfast. Write it down and keep it in a log. 2) Take your medicines as prescribed 3) Eat low salt foods-Limit salt (sodium) to 2000 mg per day.  4) Stay as active as you can everyday 5) Limit all fluids for the day to less than 2 liters

## 2017-09-04 NOTE — Progress Notes (Signed)
Ok to hold plavix for 5 days.  Patient ID: Harry Payne, male   DOB: 1951-12-01, 66 y.o.   MRN: [id number]   PCP: Dr Harry Payne Primary HF: Dr Harry Payne   HPI: Harry Payne is a 66 yo male with CAD s/p anterior MI 2004 (s/p stenting LAD and LCX) with EF 45%, DM2, and hyperlipidemia (previously in AIM-HIGH trial).    Admitted 2/17 with CP. Ruled out. Underwent cath which showed stable CAD. Readmitted in March with abd pain. Found to have cholecystitis. Genreral Surgery consulted and underwent cholecystectomy. Had episodes of hypotension with meds held. Coreg was cut back to 12.5 mg twice a day and ramipril was stopped.    He returns for routine follow up. Feels good. Denies CP/SOB. Goes to gym 2-3x week and walks on [place]. Just went to [place] and walked 50 blocks without any dififculty, Recently saw Dr. Jeffie Payne in Urology for hematuria. 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 No CP/SOB or edema.  No ICD shocks. Compliant with medications. Weight at at home 215-220 pounds. (lost 5 pounds)    LHC 02/27/2016  Prox Cx lesion, 35% stenosed. LAD stent open  There is moderate left ventricular systolic dysfunction. 1. Nonobstructive CAD 2. Moderate LV dysfunction 3. No significant change since 2012.   ECHO 01/2016: EF 35-40%. Grade IDD.  ECHO 04/2015: EF 30-35%.   Labs 04/03/2016: K 4.2 Creatinine 0.83  Labs 3/16: TC 148 TG 155 HDL 28 LDL 89   ROS: All systems negative except as listed in HPI, PMH and Problem List.  Past Medical History:  Diagnosis Date  . AICD (automatic cardioverter/defibrillator) present   . Anterior myocardial infarction [facility]) 2004 X 2  . CAD (coronary artery disease)   . CHF (congestive heart failure) ([place])   . Cholecystitis 03/25/2016  . GERD (gastroesophageal reflux disease)   . Hemorrhoids   . Hx of adenomatous colonic polyps   . Hyperlipidemia   . Hypertension   . Hypotension   . Type II diabetes mellitus ([place])     Current Outpatient Prescriptions  Medication Sig  Dispense Refill  . acetaminophen (TYLENOL) 500 MG tablet Take 500 mg by mouth every 6 (six) hours as needed for mild pain or moderate pain.    Marland Kitchen aspirin 81 MG tablet Take 81 mg by mouth daily.     Marland Kitchen atorvastatin (LIPITOR) 80 MG tablet TAKE 1 TABLET (80 MG TOTAL) BY MOUTH DAILY AT 6 PM. NEEDS OFFICE VISIT 90 tablet 0  . carvedilol (COREG) 25 MG tablet Take 0.5 tablets (12.5 mg total) by mouth 2 (two) times daily with a meal. 90 tablet 6  . clopidogrel (PLAVIX) 75 MG tablet TAKE 1 TABLET (75 MG TOTAL) BY MOUTH DAILY. NEEDS OFFICE VISIT 30 tablet 1  . glipiZIDE (GLUCOTROL XL) 5 MG 24 hr tablet TAKE 1 TABLET (5 MG TOTAL) BY MOUTH DAILY WITH BREAKFAST. 90 tablet 3  . metFORMIN (GLUCOPHAGE) 1000 MG tablet TAKE 1 TABLET (1,000 MG TOTAL) BY MOUTH 2 (TWO) TIMES DAILY WITH A MEAL. 180 tablet 3  . NITROSTAT 0.4 MG SL tablet PLACE 1 TABLET (0.4 MG TOTAL) UNDER THE TONGUE EVERY 5 (FIVE) MINUTES AS NEEDED. 25 tablet 4  . Omega-3 Fatty Acids (FISH OIL) 1000 MG CAPS Take 1 capsule by mouth daily.     . ONE TOUCH ULTRA TEST test strip CHECK BLOOD SUGAR ONCE DAY AS NEEDED FOR DM E11.9 100 each 1  . ramipril (ALTACE) 2.5 MG capsule TAKE ONE CAPSULE BY MOUTH EVERY DAY  30 capsule 1  . ranitidine (ZANTAC) 150 MG tablet TAKE 1 TABLET (150 MG TOTAL) BY MOUTH 2 (TWO) TIMES DAILY. 180 tablet 3  . spironolactone (ALDACTONE) 25 MG tablet TAKE 0.5 TABLETS (12.5 MG TOTAL) BY MOUTH DAILY. 45 tablet 3  . vitamin B-12 (CYANOCOBALAMIN) 1000 MCG tablet Take 1,000 mcg by mouth daily.       Current Facility-Administered Medications  Medication Dose Route Frequency Provider Last Rate Last Dose  . 0.9 %  sodium chloride infusion  500 mL Intravenous Continuous Harry Mayer, MD         PHYSICAL EXAM: Vitals:   09/04/17 1429  BP: 120/82  Pulse: 84  SpO2: 97%   General:  Well appearing. No resp difficulty HEENT: normal Neck: supple. no JVD. Carotids 2+ bilat; no bruits. No lymphadenopathy or thryomegaly appreciated. Cor:  PMI nondisplaced. Regular rate & rhythm. No rubs, gallops or murmurs. Lungs: clear Abdomen: soft, nontender, nondistended. No hepatosplenomegaly. No bruits or masses. Good bowel sounds. Extremities: no cyanosis, clubbing, rash, edema Neuro: alert & orientedx3, cranial nerves grossly intact. moves all 4 extremities w/o difficulty. Affect pleasant    ASSESSMENT & PLAN: 1. CAD s/p previous anterior MI- 2004 s/p stenting LAD and LCX - stable no s/s ischemia. Continue ASA, plavix and statin 2. Chronic Systolic Heart Failure- ICM 01/2016 ECHO 35-40%. -NYHA II. Volume status stable. Does not need lasix.  -Continue carvedilol 12.5 mg twice a day.  -Switch ramipril to Entresto 24/26. (36-hour washout) - Continue 12.5 mg spiro daily - BMET today.  3. Hyperlipidemia - Continue statin - Followed by Dr. Glori Payne 4. Erectile dysfunction 5. DM2  - Stable. Eventually need to switch glipizide to Jardaince. But need to watch fluid status for now as we are switching ramipril to Entresto  6. HTN -Blood pressure well controlled. Continue current regimen.  Harry Bickers MD 2:52 PM

## 2017-09-05 ENCOUNTER — Telehealth (HOSPITAL_COMMUNITY): Payer: Self-pay | Admitting: Pharmacist

## 2017-09-05 NOTE — Telephone Encounter (Signed)
Entresto 24-26 mg BID PA approved by [facility] through 09/04/18.   [name]. [name], PharmD, BCPS, CPP Clinical Pharmacist Pager: [phone] Phone: [phone] 09/05/2017 10:22 AM

## 2017-09-16 ENCOUNTER — Other Ambulatory Visit (HOSPITAL_COMMUNITY): Payer: Self-pay | Admitting: Internal Medicine

## 2017-09-23 ENCOUNTER — Ambulatory Visit (INDEPENDENT_AMBULATORY_CARE_PROVIDER_SITE_OTHER): Payer: 59 | Admitting: *Deleted

## 2017-09-23 DIAGNOSIS — I255 Ischemic cardiomyopathy: Secondary | ICD-10-CM

## 2017-09-23 LAB — CUP PACEART REMOTE DEVICE CHECK
Battery Voltage: 3.01 V
Brady Statistic RV Percent Paced: 0.01 %
Date Time Interrogation Session: 20180925052203
HIGH POWER IMPEDANCE MEASURED VALUE: 91 Ohm
Lead Channel Impedance Value: 475 Ohm
Lead Channel Impedance Value: 532 Ohm
Lead Channel Sensing Intrinsic Amplitude: 30.125 mV
Lead Channel Sensing Intrinsic Amplitude: 30.125 mV
MDC IDC LEAD IMPLANT DT: 20160615
MDC IDC LEAD LOCATION: 753860
MDC IDC MSMT BATTERY REMAINING LONGEVITY: 124 mo
MDC IDC MSMT LEADCHNL RV PACING THRESHOLD AMPLITUDE: 0.5 V
MDC IDC MSMT LEADCHNL RV PACING THRESHOLD PULSEWIDTH: 0.4 ms
MDC IDC PG IMPLANT DT: 20160615
MDC IDC SET LEADCHNL RV PACING AMPLITUDE: 2.5 V
MDC IDC SET LEADCHNL RV PACING PULSEWIDTH: 0.4 ms
MDC IDC SET LEADCHNL RV SENSING SENSITIVITY: 0.3 mV

## 2017-09-23 NOTE — Progress Notes (Signed)
Remote ICD transmission.   

## 2017-09-25 ENCOUNTER — Encounter: Payer: Self-pay | Admitting: Cardiology

## 2017-09-28 ENCOUNTER — Other Ambulatory Visit (HOSPITAL_COMMUNITY): Payer: Self-pay | Admitting: Internal Medicine

## 2017-10-27 ENCOUNTER — Other Ambulatory Visit (HOSPITAL_COMMUNITY): Payer: Self-pay | Admitting: Internal Medicine

## 2017-11-01 ENCOUNTER — Other Ambulatory Visit (HOSPITAL_COMMUNITY): Payer: Self-pay | Admitting: Internal Medicine

## 2017-11-04 ENCOUNTER — Other Ambulatory Visit (HOSPITAL_COMMUNITY): Payer: Self-pay | Admitting: *Deleted

## 2017-11-04 MED ORDER — CLOPIDOGREL BISULFATE 75 MG PO TABS: 75.0000 mg | ORAL_TABLET | Freq: Every day | ORAL | 5 refills | Status: DC

## 2017-11-06 ENCOUNTER — Other Ambulatory Visit (HOSPITAL_COMMUNITY): Payer: Self-pay | Admitting: Internal Medicine

## 2017-12-25 ENCOUNTER — Ambulatory Visit (INDEPENDENT_AMBULATORY_CARE_PROVIDER_SITE_OTHER): Payer: 59 | Admitting: *Deleted

## 2017-12-25 DIAGNOSIS — I255 Ischemic cardiomyopathy: Secondary | ICD-10-CM | POA: Diagnosis not present

## 2017-12-25 NOTE — Progress Notes (Signed)
Remote ICD transmission.   

## 2017-12-26 ENCOUNTER — Other Ambulatory Visit (HOSPITAL_COMMUNITY): Payer: Self-pay | Admitting: Internal Medicine

## 2017-12-26 ENCOUNTER — Encounter: Payer: Self-pay | Admitting: Cardiology

## 2017-12-26 LAB — CUP PACEART REMOTE DEVICE CHECK
Battery Remaining Longevity: 122 mo
Battery Voltage: 3.01 V
Brady Statistic RV Percent Paced: 0.01 %
Date Time Interrogation Session: 20181227083524
HighPow Impedance: 83 Ohm
Implantable Lead Location: 753860
Lead Channel Impedance Value: 513 Ohm
Lead Channel Setting Pacing Amplitude: 2.5 V
Lead Channel Setting Pacing Pulse Width: 0.4 ms
Lead Channel Setting Sensing Sensitivity: 0.3 mV
MDC IDC LEAD IMPLANT DT: 20160615
MDC IDC MSMT LEADCHNL RV IMPEDANCE VALUE: 456 Ohm
MDC IDC MSMT LEADCHNL RV PACING THRESHOLD AMPLITUDE: 0.5 V
MDC IDC MSMT LEADCHNL RV PACING THRESHOLD PULSEWIDTH: 0.4 ms
MDC IDC MSMT LEADCHNL RV SENSING INTR AMPL: 25.625 mV
MDC IDC MSMT LEADCHNL RV SENSING INTR AMPL: 25.625 mV
MDC IDC PG IMPLANT DT: 20160615

## 2018-01-07 ENCOUNTER — Ambulatory Visit (HOSPITAL_COMMUNITY)
Admission: RE | Admit: 2018-01-07 | Discharge: 2018-01-07 | Disposition: A | Discharge status: Home / Self Care | Payer: 59 | Source: Ambulatory Visit | Attending: Internal Medicine | Admitting: Internal Medicine

## 2018-01-07 ENCOUNTER — Encounter (HOSPITAL_COMMUNITY): Payer: Self-pay | Admitting: Internal Medicine

## 2018-01-07 VITALS — BP 95/64 | HR 80 | Wt 218.4 lb

## 2018-01-07 DIAGNOSIS — N529 Male erectile dysfunction, unspecified: Secondary | ICD-10-CM | POA: Diagnosis not present

## 2018-01-07 DIAGNOSIS — Z7902 Long term (current) use of antithrombotics/antiplatelets: Secondary | ICD-10-CM | POA: Diagnosis not present

## 2018-01-07 DIAGNOSIS — E782 Mixed hyperlipidemia: Secondary | ICD-10-CM

## 2018-01-07 DIAGNOSIS — I252 Old myocardial infarction: Secondary | ICD-10-CM | POA: Insufficient documentation

## 2018-01-07 DIAGNOSIS — E119 Type 2 diabetes mellitus without complications: Secondary | ICD-10-CM | POA: Insufficient documentation

## 2018-01-07 DIAGNOSIS — I5022 Chronic systolic (congestive) heart failure: Secondary | ICD-10-CM | POA: Diagnosis not present

## 2018-01-07 DIAGNOSIS — Z7984 Long term (current) use of oral hypoglycemic drugs: Secondary | ICD-10-CM | POA: Insufficient documentation

## 2018-01-07 DIAGNOSIS — Z7982 Long term (current) use of aspirin: Secondary | ICD-10-CM | POA: Diagnosis not present

## 2018-01-07 DIAGNOSIS — I959 Hypotension, unspecified: Secondary | ICD-10-CM | POA: Diagnosis not present

## 2018-01-07 DIAGNOSIS — E785 Hyperlipidemia, unspecified: Secondary | ICD-10-CM | POA: Insufficient documentation

## 2018-01-07 DIAGNOSIS — Z79899 Other long term (current) drug therapy: Secondary | ICD-10-CM | POA: Insufficient documentation

## 2018-01-07 DIAGNOSIS — Z9049 Acquired absence of other specified parts of digestive tract: Secondary | ICD-10-CM | POA: Insufficient documentation

## 2018-01-07 DIAGNOSIS — I10 Essential (primary) hypertension: Secondary | ICD-10-CM

## 2018-01-07 DIAGNOSIS — K219 Gastro-esophageal reflux disease without esophagitis: Secondary | ICD-10-CM | POA: Insufficient documentation

## 2018-01-07 DIAGNOSIS — Z95810 Presence of automatic (implantable) cardiac defibrillator: Secondary | ICD-10-CM | POA: Diagnosis not present

## 2018-01-07 DIAGNOSIS — I2510 Atherosclerotic heart disease of native coronary artery without angina pectoris: Secondary | ICD-10-CM | POA: Diagnosis not present

## 2018-01-07 DIAGNOSIS — I110 Hypertensive heart disease with heart failure: Secondary | ICD-10-CM | POA: Diagnosis present

## 2018-01-07 LAB — BASIC METABOLIC PANEL
Anion gap: 12 (ref 5–15)
BUN: 14 mg/dL (ref 6–20)
CHLORIDE: 104 mmol/L (ref 101–111)
CO2: 21 mmol/L — ABNORMAL LOW (ref 22–32)
Calcium: 9.5 mg/dL (ref 8.9–10.3)
Creatinine, Ser: 1.06 mg/dL (ref 0.61–1.24)
GFR calc non Af Amer: >60 mL/min (ref >60)
Glucose, Bld: 201 mg/dL — ABNORMAL HIGH (ref 65–99)
POTASSIUM: 4.2 mmol/L (ref 3.5–5.1)
SODIUM: 137 mmol/L (ref 135–145)

## 2018-01-07 NOTE — Progress Notes (Signed)
Ok to hold plavix for 5 days.  Patient ID: Harry Payne, male   DOB: 03-29-1951, 67 y.o.   MRN: [id number]   PCP: Dr Alba Cory Primary HF: Dr Haroldine Laws   HPI: Harry Payne is a 67 yo male with CAD s/p anterior MI 2004 (s/p stenting LAD and LCX) with EF 45%, DM2, and hyperlipidemia (previously in AIM-HIGH trial).    Admitted 2/17 with CP. Ruled out. Underwent cath which showed stable CAD. Readmitted in March with abd pain. Found to have cholecystitis. Genreral Surgery consulted and underwent cholecystectomy. Had episodes of hypotension with meds held. Coreg was cut back to 12.5 mg twice a day and ramipril was stopped.    He returns for routine follow up. At last visit we switched ramipril to [facility] 24/26. Says he feels good/ SBP typically over 100 but today 95. No dizziness. No orthostasis. Denies CP/SOB, orthopnea or PND. Several nights ago woke up and felt like something kicked him. Compliant with medications.   ICD interrogated in clinic. No AF, VT or ICD shocks.  Personally reviewed   LHC 02/27/2016  Prox Cx lesion, 35% stenosed. LAD stent open  There is moderate left ventricular systolic dysfunction. 1. Nonobstructive CAD 2. Moderate LV dysfunction 3. No significant change since 2012.   ECHO 01/2016: EF 35-40%. Grade IDD.  ECHO 04/2015: EF 30-35%.   Labs 04/03/2016: K 4.2 Creatinine 0.83  Labs 3/16: TC 148 TG 155 HDL 28 LDL 89   ROS: All systems negative except as listed in HPI, PMH and Problem List.  Past Medical History:  Diagnosis Date  . AICD (automatic cardioverter/defibrillator) present   . Anterior myocardial infarction [facility]) 2004 X 2  . CAD (coronary artery disease)   . CHF (congestive heart failure) ([place])   . Cholecystitis 03/25/2016  . GERD (gastroesophageal reflux disease)   . Hemorrhoids   . Hx of adenomatous colonic polyps   . Hyperlipidemia   . Hypertension   . Hypotension   . Type II diabetes mellitus ([place])     Current Outpatient Medications    Medication Sig Dispense Refill  . acetaminophen (TYLENOL) 500 MG tablet Take 500 mg by mouth every 6 (six) hours as needed for mild pain or moderate pain.    Marland Kitchen aspirin 81 MG tablet Take 81 mg by mouth daily.     Marland Kitchen atorvastatin (LIPITOR) 80 MG tablet TAKE 1 TABLET (80 MG TOTAL) BY MOUTH DAILY AT 6 PM. NEEDS OFFICE VISIT 90 tablet 0  . carvedilol (COREG) 25 MG tablet TAKE 0.5 TABLETS (12.5 MG TOTAL) BY MOUTH 2 (TWO) TIMES DAILY WITH A MEAL. 90 tablet 3  . carvedilol (COREG) 25 MG tablet TAKE 0.5 TABLETS (12.5 MG TOTAL) BY MOUTH 2 (TWO) TIMES DAILY WITH A MEAL. 90 tablet 3  . clopidogrel (PLAVIX) 75 MG tablet Take 1 tablet (75 mg total) daily by mouth. 30 tablet 5  . glipiZIDE (GLUCOTROL XL) 5 MG 24 hr tablet TAKE 1 TABLET (5 MG TOTAL) BY MOUTH DAILY WITH BREAKFAST. 90 tablet 3  . metFORMIN (GLUCOPHAGE) 1000 MG tablet TAKE 1 TABLET (1,000 MG TOTAL) BY MOUTH 2 (TWO) TIMES DAILY WITH A MEAL. 180 tablet 3  . NITROSTAT 0.4 MG SL tablet PLACE 1 TABLET (0.4 MG TOTAL) UNDER THE TONGUE EVERY 5 (FIVE) MINUTES AS NEEDED. 25 tablet 4  . Omega-3 Fatty Acids (FISH OIL) 1000 MG CAPS Take 1 capsule by mouth daily.     . ONE TOUCH ULTRA TEST test strip CHECK BLOOD SUGAR ONCE DAY AS  NEEDED FOR DM E11.9 100 each 1  . ranitidine (ZANTAC) 150 MG tablet TAKE 1 TABLET (150 MG TOTAL) BY MOUTH 2 (TWO) TIMES DAILY. 180 tablet 3  . sacubitril-valsartan (ENTRESTO) 24-26 MG Take 1 tablet by mouth 2 (two) times daily. 60 tablet 6  . spironolactone (ALDACTONE) 25 MG tablet TAKE 0.5 TABLETS (12.5 MG TOTAL) BY MOUTH DAILY. 45 tablet 2  . vitamin B-12 (CYANOCOBALAMIN) 1000 MCG tablet Take 1,000 mcg by mouth daily.       Current Facility-Administered Medications  Medication Dose Route Frequency Provider Last Rate Last Dose  . 0.9 %  sodium chloride infusion  500 mL Intravenous Continuous Gatha Mayer, MD         PHYSICAL EXAM: Vitals:   01/07/18 1343  BP: 95/64  Pulse: 80  SpO2: 97%   General:  Well appearing. No resp  difficulty HEENT: normal Neck: supple. no JVD. Carotids 2+ bilat; no bruits. No lymphadenopathy or thryomegaly appreciated. Cor: PMI nondisplaced. Regular rate & rhythm. No rubs, gallops or murmurs. Lungs: clear Abdomen: soft, nontender, nondistended. No hepatosplenomegaly. No bruits or masses. Good bowel sounds. Extremities: no cyanosis, clubbing, rash, edema Neuro: alert & orientedx3, cranial nerves grossly intact. moves all 4 extremities w/o difficulty. Affect pleasant  ECG: NSR 83 anterior Qs  No ST-T wave abnormalities. Personally reviewed    ASSESSMENT & PLAN: 1. CAD s/p previous anterior MI- 2004 s/p stenting LAD and LCX - Stable. No s/s ischemia. Continue ASA, statin and plavix 2. Chronic Systolic Heart Failure- ICM 01/2016 ECHO 35-40%. -NYHA II. Volume status stable off lasix -Continue carvedilol 12.5 mg twice a day.  -Continue Entresto 24/26. BP on low end but asymptomatic. BP at home consistently > 100. Will continue. If drops can switch back to ramipril.  - Continue 12.5 mg spiro daily - BMET today.  - Repeat echo  3. Hyperlipidemia - Continue statin. Goal LDL < 70 - Followed by Dr. Glori Bickers 4. Erectile dysfunction 5. DM2  - Stable. Eventually need to switch glipizide to Jardaince. But need to watch fluid status for now as we just started Entresto and BP soft.  6. HTN -Blood pressure on low end but tolerating well.  7. Chest shock - ICD interrogation today. No ICD firing or VT. Reassured him.   Glori Bickers MD 1:47 PM

## 2018-01-07 NOTE — Patient Instructions (Signed)
Lab today  We will contact you in 6 months to schedule your next appointment.  

## 2018-01-09 ENCOUNTER — Telehealth: Payer: Self-pay

## 2018-01-09 NOTE — Telephone Encounter (Signed)
Aware-thanks / all my slots were filled  Agree with UC if pain is that severe

## 2018-01-09 NOTE — Telephone Encounter (Signed)
Pt walked in with rt hip pain that began on 01/08/18. Pain is worse today; pain level now 8. No known injury. I spoke with pt no available appts or work ins left today at [facility] and offered appts at other [facility] sites.Carla at front desk was going to schedule for pt; pt refused appt today at 2pm at [facility]. Was advised to go to [facility]. Pt left office per Angela Nevin at front desk. FYI to Dr Glori Bickers.

## 2018-01-10 ENCOUNTER — Encounter: Payer: Self-pay | Admitting: Family Medicine

## 2018-01-10 ENCOUNTER — Ambulatory Visit: Payer: 59 | Admitting: Family Medicine

## 2018-01-10 ENCOUNTER — Other Ambulatory Visit: Payer: Self-pay

## 2018-01-10 ENCOUNTER — Emergency Department (HOSPITAL_COMMUNITY)
Admission: EM | Admit: 2018-01-10 | Discharge: 2018-01-11 | Disposition: A | Discharge status: Home / Self Care | Payer: 59 | Attending: Emergency Medicine | Admitting: Emergency Medicine

## 2018-01-10 ENCOUNTER — Encounter (HOSPITAL_COMMUNITY): Payer: Self-pay | Admitting: *Deleted

## 2018-01-10 VITALS — BP 110/70 | HR 70 | Temp 97.7°F | Ht 71.0 in | Wt 216.4 lb

## 2018-01-10 DIAGNOSIS — Z87891 Personal history of nicotine dependence: Secondary | ICD-10-CM | POA: Diagnosis not present

## 2018-01-10 DIAGNOSIS — N179 Acute kidney failure, unspecified: Secondary | ICD-10-CM | POA: Insufficient documentation

## 2018-01-10 DIAGNOSIS — Z7984 Long term (current) use of oral hypoglycemic drugs: Secondary | ICD-10-CM | POA: Insufficient documentation

## 2018-01-10 DIAGNOSIS — I2510 Atherosclerotic heart disease of native coronary artery without angina pectoris: Secondary | ICD-10-CM | POA: Diagnosis not present

## 2018-01-10 DIAGNOSIS — E119 Type 2 diabetes mellitus without complications: Secondary | ICD-10-CM | POA: Diagnosis not present

## 2018-01-10 DIAGNOSIS — M25551 Pain in right hip: Secondary | ICD-10-CM

## 2018-01-10 DIAGNOSIS — I119 Hypertensive heart disease without heart failure: Secondary | ICD-10-CM | POA: Insufficient documentation

## 2018-01-10 DIAGNOSIS — Z79899 Other long term (current) drug therapy: Secondary | ICD-10-CM | POA: Diagnosis not present

## 2018-01-10 DIAGNOSIS — Z7982 Long term (current) use of aspirin: Secondary | ICD-10-CM | POA: Insufficient documentation

## 2018-01-10 DIAGNOSIS — R1011 Right upper quadrant pain: Secondary | ICD-10-CM | POA: Diagnosis present

## 2018-01-10 DIAGNOSIS — N200 Calculus of kidney: Secondary | ICD-10-CM

## 2018-01-10 LAB — CBC WITH DIFFERENTIAL/PLATELET
BASOS ABS: 0 10*3/uL (ref 0.0–0.1)
Basophils Relative: 0 %
Eosinophils Absolute: 0.1 10*3/uL (ref 0.0–0.7)
Eosinophils Relative: 0 %
HCT: 43.3 % (ref 39.0–52.0)
HEMOGLOBIN: 14.6 g/dL (ref 13.0–17.0)
LYMPHS PCT: 14 %
Lymphs Abs: 1.9 10*3/uL (ref 0.7–4.0)
MCH: 31.9 pg (ref 26.0–34.0)
MCHC: 33.7 g/dL (ref 30.0–36.0)
MCV: 94.7 fL (ref 78.0–100.0)
MONO ABS: 0.8 10*3/uL (ref 0.1–1.0)
Monocytes Relative: 6 %
NEUTROS ABS: 11.1 10*3/uL — AB (ref 1.7–7.7)
NEUTROS PCT: 80 %
Platelets: 232 10*3/uL (ref 150–400)
RBC: 4.57 MIL/uL (ref 4.22–5.81)
RDW: 13.3 % (ref 11.5–15.5)
WBC: 13.9 10*3/uL — AB (ref 4.0–10.5)

## 2018-01-10 LAB — URINALYSIS, ROUTINE W REFLEX MICROSCOPIC
Bilirubin Urine: NEGATIVE
Glucose, UA: NEGATIVE mg/dL
Ketones, ur: NEGATIVE mg/dL
Nitrite: NEGATIVE
PH: 5 (ref 5.0–8.0)
Protein, ur: NEGATIVE mg/dL
SPECIFIC GRAVITY, URINE: 1.018 (ref 1.005–1.030)

## 2018-01-10 MED ORDER — FENTANYL CITRATE (PF) 100 MCG/2ML IJ SOLN: 50.0000 ug | INTRAMUSCULAR | Status: DC | PRN

## 2018-01-10 MED ORDER — ONDANSETRON HCL 4 MG/2ML IJ SOLN
4.0000 mg | Freq: Once | INTRAMUSCULAR | Status: AC
  Administered 2018-01-10: 4 mg via INTRAVENOUS
  Filled 2018-01-10: qty 2

## 2018-01-10 MED ORDER — SODIUM CHLORIDE 0.9 % IV BOLUS (SEPSIS)
1000.0000 mL | Freq: Once | INTRAVENOUS | Status: AC
  Administered 2018-01-10: 1000 mL via INTRAVENOUS

## 2018-01-10 MED ORDER — TRAMADOL HCL 50 MG PO TABS: 50.0000 mg | ORAL_TABLET | Freq: Three times a day (TID) | ORAL | 0 refills | Status: DC | PRN

## 2018-01-10 NOTE — Patient Instructions (Signed)
If your symptoms return - please let us know.  Otherwise, I am glad you are feeling better.  Take care, Dr Jerline Pain

## 2018-01-10 NOTE — ED Provider Notes (Signed)
[facility] EMERGENCY DEPARTMENT Provider Note   CSN: [id number] Arrival date & time: 01/10/18  2251     History   Chief Complaint Chief Complaint  Patient presents with  . Nephrolithiasis    HPI Harry Payne is a 67 y.o. male.  Harry Payne is a 67 y.o. Male who presents to the emergency department complaining of right flank pain ongoing for the past several days.  Patient reports his symptoms began about 2 days ago that have gradually worsened.  Patient reports his pain is around his right flank and fluctuates greatly intensity without warning.  He reports he can be sitting still and his pain can be very bad and then go away.  He reports today he had some nausea and has vomited several times.  She denies any history of kidney stones before.  He was seen by his PCP today who suspected a kidney stone and gave him prescription for tramadol.  He reports the tramadol has not helped with his pain and makes him not feel well.  Previous abdominal surgical history includes a cholecystectomy.  He denies fevers, difficulty urinating, dysuria, hematuria, hematemesis, diarrhea, chest pain, coughing or shortness of breath.   The history is provided by the patient, medical records and the spouse. No language interpreter was used.    Past Medical History:  Diagnosis Date  . AICD (automatic cardioverter/defibrillator) present   . Anterior myocardial infarction [facility]) 2004 X 2  . CAD (coronary artery disease)   . CHF (congestive heart failure) ([place])   . Cholecystitis 03/25/2016  . GERD (gastroesophageal reflux disease)   . Hemorrhoids   . Hx of adenomatous colonic polyps   . Hyperlipidemia   . Hypertension   . Hypotension   . Type II diabetes mellitus [facility])     Patient Active Problem List   Diagnosis Date Noted  . Hematuria 12/04/2016  . Dark urine 12/04/2016  . BPH (benign prostatic hyperplasia) 12/04/2016  . Hx of adenomatous colonic polyps 08/29/2016  . Long  term current use of antithrombotics/antiplatelets - clopidogrel and ASA 06/13/2016  . Routine general medical examination at a health care facility 06/06/2016  . Prostate cancer screening 06/06/2016  . Blood pressure instability 04/03/2016  . Abdominal pain 03/25/2016  . Hypoxia 03/25/2016  . Steatosis, liver 03/25/2016  . Old anterior myocardial infarction 02/26/2016  . CAD (coronary artery disease), native coronary artery   . AICD (automatic cardioverter/defibrillator) present   . Ischemic cardiomyopathy 06/14/2015  . Obesity 03/25/2012  . Non-insulin dependent type 2 diabetes mellitus ([place])   . Hyperlipidemia 02/09/2008  . GERD 02/09/2008    Past Surgical History:  Procedure Laterality Date  . CARDIAC CATHETERIZATION  2012   "no stent"  . CARDIAC CATHETERIZATION N/A 02/27/2016   Procedure: Left Heart Cath and Coronary Angiography;  Surgeon: Peter M Martinique, MD;  Location: [place] [facility];  Service: Cardiovascular;  Laterality: N/A;  . CHOLECYSTECTOMY N/A 03/27/2016   Procedure: LAPAROSCOPIC CHOLECYSTECTOMY WITH INTRAOPERATIVE CHOLANGIOGRAM;  Surgeon: Judeth Horn, MD;  Location: [facility];  Service: General;  Laterality: N/A;  . CLOSED REDUCTION HAND FRACTURE Left 1990  . COLONOSCOPY    . COLONOSCOPY W/ BIOPSIES    . CORONARY ANGIOPLASTY WITH STENT PLACEMENT  2004   "2"  . EP IMPLANTABLE DEVICE N/A 06/14/2015   Procedure: ICD Implant;  Surgeon: Deboraha Sprang, MD;  Location: [facility];  Service: Cardiovascular;  Laterality: N/A;       Home Medications  Prior to Admission medications   Medication Sig Start Date End Date Taking? Authorizing Provider  aspirin 81 MG tablet Take 81 mg by mouth daily.    Yes [provider]  atorvastatin (LIPITOR) 80 MG tablet TAKE 1 TABLET (80 MG TOTAL) BY MOUTH DAILY AT 6 PM. NEEDS OFFICE VISIT 11/07/17  Yes Bensimhon, Shaune Pascal, MD  carvedilol (COREG) 25 MG tablet TAKE 0.5 TABLETS (12.5 MG TOTAL) BY MOUTH 2 (TWO) TIMES DAILY  WITH A MEAL. 09/29/17  Yes Bensimhon, Shaune Pascal, MD  clopidogrel (PLAVIX) 75 MG tablet Take 1 tablet (75 mg total) daily by mouth. 11/04/17  Yes Bensimhon, Shaune Pascal, MD  glipiZIDE (GLUCOTROL XL) 5 MG 24 hr tablet TAKE 1 TABLET (5 MG TOTAL) BY MOUTH DAILY WITH BREAKFAST. 06/13/17  Yes Tower, Wynelle Fanny, MD  metFORMIN (GLUCOPHAGE) 1000 MG tablet TAKE 1 TABLET (1,000 MG TOTAL) BY MOUTH 2 (TWO) TIMES DAILY WITH A MEAL. 06/13/17  Yes Tower, Marne A, MD  NITROSTAT 0.4 MG SL tablet PLACE 1 TABLET (0.4 MG TOTAL) UNDER THE TONGUE EVERY 5 (FIVE) MINUTES AS NEEDED. 11/01/14  Yes Tower, Wynelle Fanny, MD  Omega-3 Fatty Acids (FISH OIL) 1000 MG CAPS Take 1 capsule by mouth daily.    Yes [provider]  ranitidine (ZANTAC) 150 MG tablet TAKE 1 TABLET (150 MG TOTAL) BY MOUTH 2 (TWO) TIMES DAILY. 06/13/17  Yes Tower, Wynelle Fanny, MD  sacubitril-valsartan (ENTRESTO) 24-26 MG Take 1 tablet by mouth 2 (two) times daily. 09/06/17  Yes Bensimhon, Shaune Pascal, MD  spironolactone (ALDACTONE) 25 MG tablet TAKE 0.5 TABLETS (12.5 MG TOTAL) BY MOUTH DAILY. 12/26/17  Yes Bensimhon, Shaune Pascal, MD  tamsulosin (FLOMAX) 0.4 MG CAPS capsule Take 0.4 mg by mouth daily. 12/26/17  Yes [provider]  HYDROcodone-acetaminophen (NORCO/VICODIN) 5-325 MG tablet Take 1-2 tablets by mouth every 6 (six) hours as needed for moderate pain or severe pain. 01/11/18   Waynetta Pean, PA-C  ondansetron (ZOFRAN ODT) 4 MG disintegrating tablet Take 1 tablet (4 mg total) by mouth every 8 (eight) hours as needed for nausea or vomiting. 01/11/18   Waynetta Pean, PA-C  ONE TOUCH ULTRA TEST test strip CHECK BLOOD SUGAR ONCE DAY AS NEEDED FOR DM E11.9 08/12/16   Tower, Wynelle Fanny, MD    Family History Family History  Problem Relation Age of Onset  . Prostate cancer Father   . Diabetes Brother        x3  . Lung cancer Brother   . Heart disease Brother   . Colon cancer Neg Hx   . Esophageal cancer Neg Hx   . Rectal cancer Neg Hx   . Stomach cancer Neg Hx       Social History Social History   Tobacco Use  . Smoking status: Former Smoker    Packs/day: 2.50    Years: 37.00    Pack years: 92.50    Types: Cigarettes    Last attempt to quit: 12/30/2001    Years since quitting: 16.0  . Smokeless tobacco: Never Used  Substance Use Topics  . Alcohol use: No    Alcohol/week: 0.0 oz  . Drug use: No     Allergies   Morphine and related   Review of Systems Review of Systems  Constitutional: Negative for chills and fever.  HENT: Negative for congestion and sore throat.   Eyes: Negative for visual disturbance.  Respiratory: Negative for cough and shortness of breath.   Cardiovascular: Negative for chest pain.  Gastrointestinal: Negative  for abdominal pain, diarrhea, nausea and vomiting.  Genitourinary: Positive for flank pain. Negative for difficulty urinating, dysuria, hematuria, penile pain, testicular pain and urgency.  Musculoskeletal: Negative for back pain and neck pain.  Skin: Negative for rash.  Neurological: Negative for headaches.     Physical Exam Updated Vital Signs BP 112/72   Pulse 86   Temp 98.1 F (36.7 C) (Oral)   Resp 20   SpO2 95%   Physical Exam  Constitutional: He appears well-developed and well-nourished. No distress.  Nontoxic appearing.  HENT:  Head: Normocephalic and atraumatic.  Mouth/Throat: Oropharynx is clear and moist.  Eyes: Conjunctivae are normal. Pupils are equal, round, and reactive to light. Right eye exhibits no discharge. Left eye exhibits no discharge.  Neck: Neck supple.  Cardiovascular: Normal rate, regular rhythm, normal heart sounds and intact distal pulses. Exam reveals no gallop and no friction rub.  No murmur heard. Pulmonary/Chest: Effort normal and breath sounds normal. No respiratory distress. He has no wheezes. He has no rales.  Abdominal: Soft. He exhibits no mass. There is no tenderness. There is no guarding.  Abdomen is soft and nontender to palpation.  No CVA or flank  tenderness.  Musculoskeletal: He exhibits no edema.  Lymphadenopathy:    He has no cervical adenopathy.  Neurological: He is alert. Coordination normal.  Skin: Skin is warm and dry. No rash noted. He is not diaphoretic. No erythema. No pallor.  Psychiatric: He has a normal mood and affect. His behavior is normal.  Nursing note and vitals reviewed.    ED Treatments / Results  Labs (all labs ordered are listed, but only abnormal results are displayed) Labs Reviewed  URINALYSIS, ROUTINE W REFLEX MICROSCOPIC - Abnormal; Notable for the following components:      Result Value   APPearance HAZY (*)    Hgb urine dipstick LARGE (*)    Leukocytes, UA TRACE (*)    Bacteria, UA RARE (*)    Squamous Epithelial / LPF 0-5 (*)    All other components within normal limits  COMPREHENSIVE METABOLIC PANEL - Abnormal; Notable for the following components:   Sodium 134 (*)    CO2 18 (*)    Glucose, Bld 153 (*)    BUN 25 (*)    Creatinine, Ser 2.07 (*)    Total Protein 6.4 (*)    GFR calc non Af Amer 32 (*)    GFR calc Af Amer 37 (*)    All other components within normal limits  CBC WITH DIFFERENTIAL/PLATELET - Abnormal; Notable for the following components:   WBC 13.9 (*)    Neutro Abs 11.1 (*)    All other components within normal limits    EKG  EKG Interpretation None       Radiology Ct Renal Stone Study  Result Date: 01/11/2018 CLINICAL DATA:  RIGHT flank and low back pain since yesterday. Pain worsening with tramadol. Vomiting. History of cholecystectomy colonic polyps, BPH, reflux disease, hypertension and diabetes. EXAM: CT ABDOMEN AND PELVIS WITHOUT CONTRAST TECHNIQUE: Multidetector CT imaging of the abdomen and pelvis was performed following the standard protocol without IV contrast. COMPARISON:  CT abdomen and pelvis September 19, 2017 FINDINGS: LOWER CHEST: Lung bases are clear. The visualized heart size is normal. Severe coronary artery calcifications. Calcified LEFT ventricle  associated with aneurysm. Pacemaker wires in place. No pericardial effusion. HEPATOBILIARY: Status post cholecystectomy. Negative noncontrast CT liver. PANCREAS: Normal. SPLEEN: Normal. ADRENALS/URINARY TRACT: Kidneys are orthotopic. 3 mm proximal RIGHT ureteral calculus with  mild obstructive uropathy. No residual RIGHT upper pole nephrolithiasis. 1 centimeter RIGHT upper pole cyst. 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 Severe hydroureter and distal ureterocele with dependent milk of calcium. No LEFT nephrolithiasis. Limited assessment for renal mass by noncontrast CT. Small amount of dependent calcifications in the urinary bladder which is well distended. 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 STOMACH/BOWEL: The stomach, small and large bowel are normal in caliber without inflammatory changes, sensitivity decreased by lack of enteric contrast. Central abdomen ileocecal junction. Normal appendix. VASCULAR/LYMPHATIC: Aortoiliac vessels are normal in course and caliber. Moderate calcific atherosclerosis. No lymphadenopathy by CT size criteria. REPRODUCTIVE: Mild prostatomegaly. OTHER: No intraperitoneal free fluid or free air. Small fat containing inguinal hernias. MUSCULOSKELETAL: Non-acute. Bone islands included LEFT femur. 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 Moderate lower lumbar facet arthropathy. Scattered Schmorl's nodes. IMPRESSION: 1. 3 mm proximal RIGHT ureteral calculus resulting in mild obstructive uropathy. 2. 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 3. Normal appendix. Aortic Atherosclerosis (ICD10-I70.0). Electronically Signed   By: Elon Alas M.D.   On: 01/11/2018 01:19    Procedures Procedures (including critical care time)  Medications Ordered in ED Medications  fentaNYL (SUBLIMAZE) injection 50 mcg (not administered)  sodium chloride 0.9 % bolus 1,000 mL (0 mLs  Intravenous Stopped 01/11/18 0029)  ondansetron (ZOFRAN) injection 4 mg (4 mg Intravenous Given 01/10/18 2351)  sodium chloride 0.9 % bolus 1,000 mL (0 mLs Intravenous Stopped 01/11/18 0220)     Initial Impression / Assessment and Plan / ED Course  I have reviewed the triage vital signs and the nursing notes.  Pertinent labs & imaging results that were available during my care of the patient were reviewed by me and considered in my medical decision making (see chart for details).     This  is a 68 y.o. Male who presents to the emergency department complaining of right flank pain ongoing for the past several days.  Patient reports his symptoms began about 2 days ago that have gradually worsened.  Patient reports his pain is around his right flank and fluctuates greatly intensity without warning.  He reports he can be sitting still and his pain can be very bad and then go away.  He reports today he had some nausea and has vomited several times.  She denies any history of kidney stones before.  He was seen by his PCP today who suspected a kidney stone and gave him prescription for tramadol.  He reports the tramadol has not helped with his pain and makes him not feel well.  Previous abdominal surgical history includes a cholecystectomy.  On exam the patient is afebrile nontoxic-appearing.  His abdomen is soft and nontender to palpation.  He denies any current pain, but reports this is been coming and going. CMP is remarkable for creatinine of 2.07 which is almost double from his baseline.  CBC is remarkable only for mild leukocytosis with a white count of 13,900.  Urinalysis without sign of infection and does show hematuria. CT renal stone study shows a 3 mm proximal right ureteral calculus resulting in mild obstructive uropathy.   There is also a duplicated left collecting system with severe chronic hydroureter and milk of calcium.  This is not a new finding for the patient.  Patient tells me that he has  been evaluated by urology previously for this. And recheck following 2 L fluid bolus patient tells me he is still  pain-free.  He is required no pain medicine in the emergency department.  He is tolerating p.o.  He tells me he feels ready to go home.  I discussed findings with the patient including his elevated creatinine.  He is tolerating p.o. and encouraged him to stay well-hydrated.  We will have him discontinue the tramadol and provide him with hydrocodone to take as needed for pain.  He is on Flomax daily.  Zofran as needed for nausea.  He has follow-up with urologist Dr. Roni Bread.  I encouraged him to follow-up with primary care as well as to have his kidney function rechecked this week.  I advised if he has worsening pain, is unable to urinate or vomiting he cannot stop he needs to return to the emergency department immediately. I advised the patient to follow-up with their primary care provider this week. I advised the patient to return to the emergency department with new or worsening symptoms or new concerns. The patient verbalized understanding and agreement with plan.    This patient was discussed with Dr. Christy Gentles who agrees with assessment and plan.   Final Clinical Impressions(s) / ED Diagnoses   Final diagnoses:  Nephrolithiasis  AKI (acute kidney injury) [facility])    ED Discharge Orders        Ordered    ondansetron (ZOFRAN ODT) 4 MG disintegrating tablet  Every 8 hours PRN     01/11/18 0230    HYDROcodone-acetaminophen (NORCO/VICODIN) 5-325 MG tablet  Every 6 hours PRN     01/11/18 0230       Waynetta Pean, PA-C 01/11/18 0248    [place] Fraise, MD 01/11/18 [phone]

## 2018-01-10 NOTE — Progress Notes (Addendum)
    Subjective:  Harry Payne is a 67 y.o. male who presents today for same-day visit with a chief complaint of hip pain.   HPI:  Hip pain, acute issue Started 2 days ago. Located at right hip.  No clear precipitating event.  Pain was very severe for 2 days, then patient had one episode of vomiting last night and pain resolved. Not currently having any hip pain. No fevers or chills.  No dysuria.  No hematuria.  No further episodes of vomiting.  No nausea.  No diarrhea or constipation.  Tried taking Tylenol which did not significantly seem to help.  Took some old tramadol which helped a little bit. No other obvious alleviating or aggravating factors.  ROS: Per HPI  PMH: He reports that he quit smoking about 16 years ago. His smoking use included cigarettes. He has a 92.50 pack-year smoking history. he has never used smokeless tobacco. He reports that he does not drink alcohol or use drugs.    Objective:  Physical Exam: BP 110/70 (BP Location: Left Arm, Patient Position: Sitting, Cuff Size: Normal)   Pulse 70   Temp 97.7 F (36.5 C) (Oral)   Ht 5\' 11"  (1.803 m)   Wt 216 lb 6.1 oz (98.1 kg)   SpO2 95%   BMI 30.18 kg/m   Gen: NAD, resting comfortably CV: RRR with no murmurs appreciated Pulm: NWOB, CTAB with no crackles, wheezes, or rhonchi GI: Obese, normal bowel sounds, soft, nontender, nondistended.  No tenderness at McBurney's point MSK: -Right hip: No deformities.  Nontender to palpation.  Slightly limited range of motion with internal and external rotation.  Strength 5 out of 5 in all directions. -Right knee: No deformities.  Full range of motion.  Slight crepitus with active range of motion. -Left knee: No deformities.  Slight crepitus with active range of motion.  Assessment/Plan:  Right hip pain Unclear etiology.  Symptoms have since resolved.  He may have underlying osteoarthritis.  Other possibilities include kidney stone that he passed.  His abdominal exam is benign  does not have any red flag signs or symptoms to suggest intra-abdominal pathology.  Patient requested a few extra tramadol to help him in case pain returns.  This was sent into his pharmacy.  Discussed return precautions including return of pain, pain not responding to medications, intractable nausea/vomiting, and inability to take p.o.  Follow-up as needed.  Algis Greenhouse. Jerline Pain, MD 01/10/2018 9:26 AM

## 2018-01-10 NOTE — ED Triage Notes (Signed)
Pt c/o R flank and lower back pain since yesterday. Was seen yesterday and given prescription for tramadol. Reports pain worsens with tramadol. Has had little PO intake due to vomiting, has been able to urinate

## 2018-01-10 NOTE — ED Notes (Signed)
Pt states that he has pain "on the right side above the kidney." Pt states that the pain is constant.

## 2018-01-11 ENCOUNTER — Emergency Department (HOSPITAL_COMMUNITY): Payer: 59

## 2018-01-11 LAB — COMPREHENSIVE METABOLIC PANEL
ALK PHOS: 52 U/L (ref 38–126)
ALT: 20 U/L (ref 17–63)
ANION GAP: 13 (ref 5–15)
AST: 27 U/L (ref 15–41)
Albumin: 3.6 g/dL (ref 3.5–5.0)
BILIRUBIN TOTAL: 1.2 mg/dL (ref 0.3–1.2)
BUN: 25 mg/dL — ABNORMAL HIGH (ref 6–20)
CALCIUM: 9 mg/dL (ref 8.9–10.3)
CO2: 18 mmol/L — ABNORMAL LOW (ref 22–32)
CREATININE: 2.07 mg/dL — AB (ref 0.61–1.24)
Chloride: 103 mmol/L (ref 101–111)
GFR, EST AFRICAN AMERICAN: 37 mL/min — AB (ref >60)
GFR, EST NON AFRICAN AMERICAN: 32 mL/min — AB (ref >60)
Glucose, Bld: 153 mg/dL — ABNORMAL HIGH (ref 65–99)
Potassium: 3.9 mmol/L (ref 3.5–5.1)
SODIUM: 134 mmol/L — AB (ref 135–145)
TOTAL PROTEIN: 6.4 g/dL — AB (ref 6.5–8.1)

## 2018-01-11 MED ORDER — SODIUM CHLORIDE 0.9 % IV BOLUS (SEPSIS)
1000.0000 mL | Freq: Once | INTRAVENOUS | Status: AC
  Administered 2018-01-11: 1000 mL via INTRAVENOUS

## 2018-01-11 MED ORDER — ONDANSETRON 4 MG PO TBDP: 4.0000 mg | ORAL_TABLET | Freq: Three times a day (TID) | ORAL | 0 refills | Status: DC | PRN

## 2018-01-11 MED ORDER — HYDROCODONE-ACETAMINOPHEN 5-325 MG PO TABS: 1.0000 | ORAL_TABLET | Freq: Four times a day (QID) | ORAL | 0 refills | Status: DC | PRN

## 2018-01-11 NOTE — ED Notes (Signed)
Patient transported to CT 

## 2018-01-11 NOTE — ED Notes (Signed)
Pt discharged from ED; instructions provided and scripts given; Pt encouraged to return to ED if symptoms worsen and to f/u with PCP; Pt verbalized understanding of all instructions 

## 2018-01-11 NOTE — ED Notes (Signed)
Pt returned from CT °

## 2018-01-11 NOTE — Discharge Instructions (Addendum)
Please have your kidney function rechecked by your primary care doctor next week.

## 2018-01-14 ENCOUNTER — Inpatient Hospital Stay: Payer: 59 | Admitting: Family Medicine

## 2018-01-16 ENCOUNTER — Encounter: Payer: Self-pay | Admitting: Family Medicine

## 2018-01-16 ENCOUNTER — Ambulatory Visit: Payer: 59 | Admitting: Family Medicine

## 2018-01-16 VITALS — BP 116/64 | HR 77 | Temp 97.3°F | Ht 71.0 in | Wt 213.5 lb

## 2018-01-16 DIAGNOSIS — N200 Calculus of kidney: Secondary | ICD-10-CM | POA: Insufficient documentation

## 2018-01-16 DIAGNOSIS — E119 Type 2 diabetes mellitus without complications: Secondary | ICD-10-CM

## 2018-01-16 DIAGNOSIS — E1165 Type 2 diabetes mellitus with hyperglycemia: Secondary | ICD-10-CM | POA: Diagnosis not present

## 2018-01-16 DIAGNOSIS — I5022 Chronic systolic (congestive) heart failure: Secondary | ICD-10-CM

## 2018-01-16 DIAGNOSIS — IMO0001 Reserved for inherently not codable concepts without codable children: Secondary | ICD-10-CM | POA: Insufficient documentation

## 2018-01-16 NOTE — Assessment & Plan Note (Signed)
Right/obstructive - seen by Dr Jeffie Pollock 2 d ago (sent for info and labs)  Elevated cr If no imp next week sounds like they will need to retrieve it  Disc imp of hydration  Enc to take zofran for nausea if needed Pain med prn from dr Jeffie Pollock- with caution of constipation  flomax-continue  F/u urology next week as planned

## 2018-01-16 NOTE — Assessment & Plan Note (Signed)
Seeing cardiology  On entresto

## 2018-01-16 NOTE — Progress Notes (Signed)
Subjective:    Patient ID: Harry Payne, male    DOB: 1951-09-16, 67 y.o.   MRN: [id number]  HPI Here for f/u of ED visit on 0/01 for colicky R flank pain with n/v   Tramadol had been given in the office prior to this with suspicion of renal stone  It did not help  UA was hazy with large Hgb  Lab Results  Component Value Date   CREATININE 2.07 (H) 01/10/2018   BUN 25 (H) 01/10/2018   NA 134 (L) 01/10/2018   K 3.9 01/10/2018   CL 103 01/10/2018   CO2 18 (L) 01/10/2018   Lab Results  Component Value Date   WBC 13.9 (H) 01/10/2018   HGB 14.6 01/10/2018   HCT 43.3 01/10/2018   MCV 94.7 01/10/2018   PLT 232 01/10/2018    CT done Ct Renal Stone Study  Result Date: 01/11/2018 CLINICAL DATA:  RIGHT flank and low back pain since yesterday. Pain worsening with tramadol. Vomiting. History of cholecystectomy colonic polyps, BPH, reflux disease, hypertension and diabetes. EXAM: CT ABDOMEN AND PELVIS WITHOUT CONTRAST TECHNIQUE: Multidetector CT imaging of the abdomen and pelvis was performed following the standard protocol without IV contrast. COMPARISON:  CT abdomen and pelvis September 19, 2017 FINDINGS: LOWER CHEST: Lung bases are clear. The visualized heart size is normal. Severe coronary artery calcifications. Calcified LEFT ventricle associated with aneurysm. Pacemaker wires in place. No pericardial effusion. HEPATOBILIARY: Status post cholecystectomy. Negative noncontrast CT liver. PANCREAS: Normal. SPLEEN: Normal. ADRENALS/URINARY TRACT: Kidneys are orthotopic. 3 mm proximal RIGHT ureteral calculus with mild obstructive uropathy. No residual RIGHT upper pole nephrolithiasis. 1 centimeter RIGHT upper pole cyst. 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 Severe hydroureter and distal ureterocele with dependent milk of calcium. No LEFT nephrolithiasis. Limited assessment for renal mass by noncontrast CT. Small amount  of dependent calcifications in the urinary bladder which is well distended. 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 STOMACH/BOWEL: The stomach, small and large bowel are normal in caliber without inflammatory changes, sensitivity decreased by lack of enteric contrast. Central abdomen ileocecal junction. Normal appendix. VASCULAR/LYMPHATIC: Aortoiliac vessels are normal in course and caliber. Moderate calcific atherosclerosis. No lymphadenopathy by CT size criteria. REPRODUCTIVE: Mild prostatomegaly. OTHER: No intraperitoneal free fluid or free air. Small fat containing inguinal hernias. MUSCULOSKELETAL: Non-acute. Bone islands included LEFT femur. 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 Moderate lower lumbar facet arthropathy. Scattered Schmorl's nodes. IMPRESSION: 1. 3 mm proximal RIGHT ureteral calculus resulting in mild obstructive uropathy. 2. 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 3. Normal appendix. Aortic Atherosclerosis (ICD10-I70.0). Electronically Signed   By: Elon Alas M.D.   On: 01/11/2018 01:19    R proximal ureteral stone  Incidental duplicated L collecting system with chronic hydroureter  Some aortic atherosclerosis   Her was tx with fluids/zofran/pain medication was not needed as pain improved in ED w/o it   He was px hydrococone and flomax and zofran  Told to f/u with urologist Dr Roni Bread He followed up on Tuesday  Was told it is still high up in the ureter--not moving ?  They may have to do something procedure wise for the stone   Right now he is having some pain-not severe  Taking flomax  Cannot eat much due to nausea    Wt Readings from Last 3 Encounters:  01/16/18 213 lb 8 oz (96.8 kg)  01/10/18 216 lb 6.1 oz (98.1 kg)  01/07/18 218 lb 6.4 oz (99.1 kg)   BP Readings from Last 3 Encounters:  01/16/18 116/64  01/11/18 112/72  01/10/18 110/70    Had labs from Dr Jeffie Pollock for his kidney function -no results yet?  Is interested  in jardiance for his DM instead of metformin  Lab Results  Component Value Date   HGBA1C 6.9 (H) 06/11/2017  he follows a diabetes  Has not exercised at all - but his part time job gives him a good work out    Dr Jeffie Pollock is giving his pain medicine right now  Some constipation -but not eating much anyway   Patient Active Problem List   Diagnosis Date Noted  . Renal stone 01/16/2018  . Hematuria 12/04/2016  . BPH (benign prostatic hyperplasia) 12/04/2016  . Hx of adenomatous colonic polyps 08/29/2016  . Long term current use of antithrombotics/antiplatelets - clopidogrel and ASA 06/13/2016  . Routine general medical examination at a health care facility 06/06/2016  . Prostate cancer screening 06/06/2016  . Blood pressure instability 04/03/2016  . Abdominal pain 03/25/2016  . Hypoxia 03/25/2016  . Steatosis, liver 03/25/2016  . Chronic systolic CHF (congestive heart failure) ([place]) 02/26/2016  . Old anterior myocardial infarction 02/26/2016  . CAD (coronary artery disease), native coronary artery   . AICD (automatic cardioverter/defibrillator) present   . Ischemic cardiomyopathy 06/14/2015  . Obesity 03/25/2012  . Non-insulin dependent type 2 diabetes mellitus ([place])   . Hyperlipidemia 02/09/2008  . GERD 02/09/2008   Past Medical History:  Diagnosis Date  . AICD (automatic cardioverter/defibrillator) present   . Anterior myocardial infarction [facility]) 2004 X 2  . CAD (coronary artery disease)   . CHF (congestive heart failure) ([place])   . Cholecystitis 03/25/2016  . GERD (gastroesophageal reflux disease)   . Hemorrhoids   . Hx of adenomatous colonic polyps   . Hyperlipidemia   . Hypertension   . Hypotension   . Type II diabetes mellitus ([place])    Past Surgical History:  Procedure Laterality Date  . CARDIAC CATHETERIZATION  2012   "no stent"  . CARDIAC CATHETERIZATION N/A 02/27/2016   Procedure: Left Heart Cath and Coronary Angiography;  Surgeon: Peter M Martinique, MD;  Location:  [place] [facility];  Service: Cardiovascular;  Laterality: N/A;  . CHOLECYSTECTOMY N/A 03/27/2016   Procedure: LAPAROSCOPIC CHOLECYSTECTOMY WITH INTRAOPERATIVE CHOLANGIOGRAM;  Surgeon: Judeth Horn, MD;  Location: [place];  Service: General;  Laterality: N/A;  . CLOSED REDUCTION HAND FRACTURE Left 1990  . COLONOSCOPY    . COLONOSCOPY W/ BIOPSIES    . CORONARY ANGIOPLASTY WITH STENT PLACEMENT  2004   "2"  . EP IMPLANTABLE DEVICE N/A 06/14/2015   Procedure: ICD Implant;  Surgeon: Deboraha Sprang, MD;  Location: [place] [facility];  Service: Cardiovascular;  Laterality: N/A;   Social History   Tobacco Use  . Smoking status: Former Smoker    Packs/day: 2.50    Years: 37.00    Pack years: 92.50    Types: Cigarettes    Last attempt to quit: 12/30/2001    Years since quitting: 16.0  . Smokeless tobacco: Never Used  Substance Use Topics  . Alcohol use: No    Alcohol/week: 0.0 oz  . Drug use: No   Family History  Problem Relation Age of Onset  . Prostate cancer Father   . Diabetes Brother        x3  . Lung cancer Brother   . Heart disease Brother   . Colon cancer Neg  Hx   . Esophageal cancer Neg Hx   . Rectal cancer Neg Hx   . Stomach cancer Neg Hx    Allergies  Allergen Reactions  . Morphine And Related Other (See Comments)    Tachycardia   Current Outpatient Medications on File Prior to Visit  Medication Sig Dispense Refill  . aspirin 81 MG tablet Take 81 mg by mouth daily.     Marland Kitchen atorvastatin (LIPITOR) 80 MG tablet TAKE 1 TABLET (80 MG TOTAL) BY MOUTH DAILY AT 6 PM. NEEDS OFFICE VISIT 90 tablet 0  . carvedilol (COREG) 25 MG tablet TAKE 0.5 TABLETS (12.5 MG TOTAL) BY MOUTH 2 (TWO) TIMES DAILY WITH A MEAL. 90 tablet 3  . clopidogrel (PLAVIX) 75 MG tablet Take 1 tablet (75 mg total) daily by mouth. 30 tablet 5  . glipiZIDE (GLUCOTROL XL) 5 MG 24 hr tablet TAKE 1 TABLET (5 MG TOTAL) BY MOUTH DAILY WITH BREAKFAST. 90 tablet 3  . HYDROcodone-acetaminophen (NORCO/VICODIN) 5-325 MG tablet  Take 1-2 tablets by mouth every 6 (six) hours as needed for moderate pain or severe pain. 12 tablet 0  . metFORMIN (GLUCOPHAGE) 1000 MG tablet TAKE 1 TABLET (1,000 MG TOTAL) BY MOUTH 2 (TWO) TIMES DAILY WITH A MEAL. 180 tablet 3  . NITROSTAT 0.4 MG SL tablet PLACE 1 TABLET (0.4 MG TOTAL) UNDER THE TONGUE EVERY 5 (FIVE) MINUTES AS NEEDED. 25 tablet 4  . Omega-3 Fatty Acids (FISH OIL) 1000 MG CAPS Take 1 capsule by mouth daily.     . ondansetron (ZOFRAN ODT) 4 MG disintegrating tablet Take 1 tablet (4 mg total) by mouth every 8 (eight) hours as needed for nausea or vomiting. 10 tablet 0  . ONE TOUCH ULTRA TEST test strip CHECK BLOOD SUGAR ONCE DAY AS NEEDED FOR DM E11.9 100 each 1  . ranitidine (ZANTAC) 150 MG tablet TAKE 1 TABLET (150 MG TOTAL) BY MOUTH 2 (TWO) TIMES DAILY. 180 tablet 3  . sacubitril-valsartan (ENTRESTO) 24-26 MG Take 1 tablet by mouth 2 (two) times daily. 60 tablet 6  . spironolactone (ALDACTONE) 25 MG tablet TAKE 0.5 TABLETS (12.5 MG TOTAL) BY MOUTH DAILY. 45 tablet 2  . tamsulosin (FLOMAX) 0.4 MG CAPS capsule Take 0.4 mg by mouth daily.  3  . [DISCONTINUED] pantoprazole (PROTONIX) 40 MG tablet Take 1 tablet (40 mg total) by mouth daily. 30 tablet 11   Current Facility-Administered Medications on File Prior to Visit  Medication Dose Route Frequency Provider Last Rate Last Dose  . 0.9 %  sodium chloride infusion  500 mL Intravenous Continuous Gatha Mayer, MD         Review of Systems  Constitutional: Negative for activity change, appetite change, fatigue, fever and unexpected weight change.  HENT: Negative for congestion, rhinorrhea, sore throat and trouble swallowing.   Eyes: Negative for pain, redness, itching and visual disturbance.  Respiratory: Negative for cough, chest tightness, shortness of breath and wheezing.   Cardiovascular: Negative for chest pain and palpitations.  Gastrointestinal: Positive for nausea. Negative for abdominal pain, blood in stool,  constipation, diarrhea and vomiting.  Endocrine: Negative for cold intolerance, heat intolerance, polydipsia and polyuria.  Genitourinary: Positive for flank pain. Negative for difficulty urinating, dysuria, hematuria and urgency.       Pos for R flank pain   Musculoskeletal: Negative for arthralgias, joint swelling and myalgias.  Skin: Negative for pallor and rash.  Neurological: Negative for dizziness, tremors, weakness, numbness and headaches.  Hematological: Negative for adenopathy. Does not bruise/bleed  easily.  Psychiatric/Behavioral: Negative for decreased concentration and dysphoric mood. The patient is nervous/anxious.        Objective:   Physical Exam  Constitutional: He appears well-developed and well-nourished. No distress.  obese and well appearing   HENT:  Head: Normocephalic and atraumatic.  Eyes: Conjunctivae and EOM are normal. Pupils are equal, round, and reactive to light.  Neck: Normal range of motion. Neck supple.  Cardiovascular: Normal rate, regular rhythm and normal heart sounds.  Pulmonary/Chest: Effort normal and breath sounds normal.  Abdominal: Soft. Bowel sounds are normal. He exhibits no distension. There is tenderness. There is no rebound.  No cva tenderness Mild R flank tenderness to deep palpation  No M or rebound or guarding  Mild suprapubic tenderness  Musculoskeletal: He exhibits no edema.  Lymphadenopathy:    He has no cervical adenopathy.  Neurological: He is alert. No cranial nerve deficit. He exhibits normal muscle tone. Coordination normal.  Skin: No rash noted. No pallor.  Psychiatric: His speech is normal and behavior is normal. His mood appears anxious. He does not exhibit a depressed mood.  Somewhat anxious today          Assessment & Plan:   Problem List Items Addressed This Visit      Cardiovascular and Mediastinum   Chronic systolic CHF (congestive heart failure) (HCC) (Chronic)    Seeing cardiology  On entresto          Endocrine   RESOLVED: Diabetes mellitus type 2, uncontrolled, without complications ([place])   Non-insulin dependent type 2 diabetes mellitus (HCC) (Chronic)    Due for lab/f/u but feeling too poorly today from his renal stone  Will f/u in about a month for labs and visit  Disc imp of healthy diet and exercise when able       Relevant Orders   Hemoglobin A1c   Comprehensive metabolic panel   Lipid panel     Genitourinary   Renal stone - Primary    Right/obstructive - seen by Dr Jeffie Pollock 2 d ago (sent for info and labs)  Elevated cr If no imp next week sounds like they will need to retrieve it  Disc imp of hydration  Enc to take zofran for nausea if needed Pain med prn from dr Jeffie Pollock- with caution of constipation  flomax-continue  F/u urology next week as planned       Relevant Orders   Comprehensive metabolic panel

## 2018-01-16 NOTE — Patient Instructions (Addendum)
Don't forget to use the zofran for nausea if needed  Pain medication as well   Keep drinking lots of fluids to get this passed    I will send for records from Dr Jeffie Pollock   Call your insurance co to see if Vania Rea is covered and let me know  If not - I need a list of what is covered   Follow up for a visit for diabetes with labs prior in about a month   If you need to take something for constipation- miralax is a good choice

## 2018-01-16 NOTE — Assessment & Plan Note (Signed)
Due for lab/f/u but feeling too poorly today from his renal stone  Will f/u in about a month for labs and visit  Disc imp of healthy diet and exercise when able

## 2018-01-22 ENCOUNTER — Telehealth (HOSPITAL_COMMUNITY): Payer: Self-pay | Admitting: *Deleted

## 2018-01-22 NOTE — Telephone Encounter (Signed)
Received surgical clearance from [facility].  Patient is cleared to hold aspirin and plavix prior to procedure per Dr. Haroldine Laws.   Clearance faxed today to [phone].

## 2018-02-02 ENCOUNTER — Other Ambulatory Visit (HOSPITAL_COMMUNITY): Payer: Self-pay | Admitting: Internal Medicine

## 2018-02-02 ENCOUNTER — Other Ambulatory Visit: Payer: Self-pay | Admitting: Internal Medicine

## 2018-02-13 ENCOUNTER — Other Ambulatory Visit (INDEPENDENT_AMBULATORY_CARE_PROVIDER_SITE_OTHER): Payer: 59

## 2018-02-13 DIAGNOSIS — N200 Calculus of kidney: Secondary | ICD-10-CM

## 2018-02-13 DIAGNOSIS — E119 Type 2 diabetes mellitus without complications: Secondary | ICD-10-CM | POA: Diagnosis not present

## 2018-02-13 LAB — COMPREHENSIVE METABOLIC PANEL
ALK PHOS: 48 U/L (ref 39–117)
ALT: 20 U/L (ref 0–53)
AST: 14 U/L (ref 0–37)
Albumin: 4 g/dL (ref 3.5–5.2)
BILIRUBIN TOTAL: 0.8 mg/dL (ref 0.2–1.2)
BUN: 18 mg/dL (ref 6–23)
CALCIUM: 9 mg/dL (ref 8.4–10.5)
CO2: 27 meq/L (ref 19–32)
Chloride: 101 mEq/L (ref 96–112)
Creatinine, Ser: 1.07 mg/dL (ref 0.40–1.50)
GFR: 73.48 mL/min (ref >60.00)
GLUCOSE: 168 mg/dL — AB (ref 70–99)
POTASSIUM: 4.2 meq/L (ref 3.5–5.1)
Sodium: 138 mEq/L (ref 135–145)
TOTAL PROTEIN: 6.6 g/dL (ref 6.0–8.3)

## 2018-02-13 LAB — LIPID PANEL
Cholesterol: 100 mg/dL (ref 0–200)
HDL: 24.8 mg/dL — AB (ref >39.00)
LDL Cholesterol: 52 mg/dL (ref 0–99)
NonHDL: 75.16
TRIGLYCERIDES: 117 mg/dL (ref 0.0–149.0)
Total CHOL/HDL Ratio: 4
VLDL: 23.4 mg/dL (ref 0.0–40.0)

## 2018-02-13 LAB — HEMOGLOBIN A1C: Hgb A1c MFr Bld: 6.6 % — ABNORMAL HIGH (ref 4.6–6.5)

## 2018-02-17 ENCOUNTER — Ambulatory Visit: Payer: 59 | Admitting: Family Medicine

## 2018-02-17 ENCOUNTER — Encounter: Payer: Self-pay | Admitting: Family Medicine

## 2018-02-17 VITALS — BP 116/70 | HR 68 | Temp 97.6°F | Ht 71.0 in | Wt 212.5 lb

## 2018-02-17 DIAGNOSIS — I998 Other disorder of circulatory system: Secondary | ICD-10-CM | POA: Diagnosis not present

## 2018-02-17 DIAGNOSIS — E663 Overweight: Secondary | ICD-10-CM | POA: Diagnosis not present

## 2018-02-17 DIAGNOSIS — E782 Mixed hyperlipidemia: Secondary | ICD-10-CM

## 2018-02-17 DIAGNOSIS — E119 Type 2 diabetes mellitus without complications: Secondary | ICD-10-CM | POA: Diagnosis not present

## 2018-02-17 MED ORDER — RANITIDINE HCL 150 MG PO TABS: ORAL_TABLET | ORAL | 3 refills | Status: AC

## 2018-02-17 MED ORDER — METFORMIN HCL 1000 MG PO TABS: ORAL_TABLET | ORAL | 3 refills | Status: AC

## 2018-02-17 MED ORDER — GLIPIZIDE ER 5 MG PO TB24: ORAL_TABLET | ORAL | 3 refills | Status: AC

## 2018-02-17 NOTE — Patient Instructions (Signed)
Take care of yourself !  Good luck with the move - I hope you love it in [place] the better eating  Try to move more and more- get started with exercise  Socialize as much as you can after you move   Don't forget to get your eye exam

## 2018-02-17 NOTE — Assessment & Plan Note (Signed)
bp has been stable

## 2018-02-17 NOTE — Progress Notes (Signed)
Subjective:    Patient ID: Harry Payne, male    DOB: [birth year], 67 y.o.   MRN: [id number]  HPI  Here for f/u of chronic medical problems  Moving to [facility] may [facility]) He is excited / wife is retiring   More active lately - part time job right now   [employer] from Last 3 Encounters:  02/17/18 212 lb 8 oz (96.4 kg)  01/16/18 213 lb 8 oz (96.8 kg)  01/10/18 216 lb 6.1 oz (98.1 kg)  weight is down a bit  29.64 kg/m   BP Readings from Last 3 Encounters:  02/17/18 116/70  01/16/18 116/64  01/11/18 112/72   Pulse Readings from Last 3 Encounters:  02/17/18 68  01/16/18 77  01/11/18 86   Sees cardiology for CHF and CAD ([facility]) He did find a cardiologist there to get est with    Diabetes Home sugar results -no low blood glucose levels   (usually 105)  DM diet -cut back on eating / getting more motivated  Exercise -he burned out on exercise at the gym/working now  Also moving [place] area with beach /pool and loves to swim  Symptoms A1C last  Lab Results  Component Value Date   HGBA1C 6.6 (H) 02/13/2018  down from 6.9  No problems with medications - metformin and glipizide  Renal protection- ARB Last eye exam  - 2 y ago= has appt set up in [place] in May (too hard to schedule [place]   Lab Results  Component Value Date   CREATININE 1.07 02/13/2018   BUN 18 02/13/2018   NA 138 02/13/2018   K 4.2 02/13/2018   CL 101 02/13/2018   CO2 27 02/13/2018   Lab Results  Component Value Date   ALT 20 02/13/2018   AST 14 02/13/2018   ALKPHOS 48 02/13/2018   BILITOT 0.8 02/13/2018      Lab Results  Component Value Date   CHOL 100 02/13/2018   CHOL 115 06/11/2017   CHOL 119 06/11/2016   Lab Results  Component Value Date   HDL 24.80 (L) 02/13/2018   HDL 25.90 (L) 06/11/2017   HDL 30.80 (L) 06/11/2016   Lab Results  Component Value Date   LDLCALC 52 02/13/2018   LDLCALC 64 06/11/2017   LDLCALC 63 06/11/2016   Lab Results  Component Value Date   TRIG 117.0 02/13/2018   TRIG 125.0 06/11/2017   TRIG 126.0 06/11/2016   Lab Results  Component Value Date   CHOLHDL 4 02/13/2018   CHOLHDL 4 06/11/2017   CHOLHDL 4 06/11/2016   No results found for: LDLDIRECT Atorvastatin 80 Tolerates well  HDL is still low -has been unable to inc this   Patient Active Problem List   Diagnosis Date Noted  . Renal stone 01/16/2018  . Hematuria 12/04/2016  . BPH (benign prostatic hyperplasia) 12/04/2016  . Hx of adenomatous colonic polyps 08/29/2016  . Long term current use of antithrombotics/antiplatelets - clopidogrel and ASA 06/13/2016  . Routine general medical examination at a health care facility 06/06/2016  . Prostate cancer screening 06/06/2016  . Blood pressure instability 04/03/2016  . Abdominal pain 03/25/2016  . Hypoxia 03/25/2016  . Steatosis, liver 03/25/2016  . Chronic systolic CHF (congestive heart failure) ([place]) 02/26/2016  . Old anterior myocardial infarction 02/26/2016  . CAD (coronary artery disease), native coronary artery   . AICD (automatic cardioverter/defibrillator) present   . Ischemic cardiomyopathy 06/14/2015  . Overweight (BMI 25.0-29.9) 03/25/2012  .  Non-insulin dependent type 2 diabetes mellitus ([place])   . Hyperlipidemia 02/09/2008  . GERD 02/09/2008   Past Medical History:  Diagnosis Date  . AICD (automatic cardioverter/defibrillator) present   . Anterior myocardial infarction [facility]) 2004 X 2  . CAD (coronary artery disease)   . CHF (congestive heart failure) ([place])   . Cholecystitis 03/25/2016  . GERD (gastroesophageal reflux disease)   . Hemorrhoids   . Hx of adenomatous colonic polyps   . Hyperlipidemia   . Hypertension   . Hypotension   . Type II diabetes mellitus ([place])    Past Surgical History:  Procedure Laterality Date  . CARDIAC CATHETERIZATION  2012   "no stent"  . CARDIAC CATHETERIZATION N/A 02/27/2016   Procedure: Left Heart Cath and Coronary Angiography;  Surgeon: Peter M Martinique, MD;   Location: [place] [facility];  Service: Cardiovascular;  Laterality: N/A;  . CHOLECYSTECTOMY N/A 03/27/2016   Procedure: LAPAROSCOPIC CHOLECYSTECTOMY WITH INTRAOPERATIVE CHOLANGIOGRAM;  Surgeon: Judeth Horn, MD;  Location: [place];  Service: General;  Laterality: N/A;  . CLOSED REDUCTION HAND FRACTURE Left 1990  . COLONOSCOPY    . COLONOSCOPY W/ BIOPSIES    . CORONARY ANGIOPLASTY WITH STENT PLACEMENT  2004   "2"  . EP IMPLANTABLE DEVICE N/A 06/14/2015   Procedure: ICD Implant;  Surgeon: Deboraha Sprang, MD;  Location: [place] [facility];  Service: Cardiovascular;  Laterality: N/A;   Social History   Tobacco Use  . Smoking status: Former Smoker    Packs/day: 2.50    Years: 37.00    Pack years: 92.50    Types: Cigarettes    Last attempt to quit: 12/30/2001    Years since quitting: 16.1  . Smokeless tobacco: Never Used  Substance Use Topics  . Alcohol use: No    Alcohol/week: 0.0 oz  . Drug use: No   Family History  Problem Relation Age of Onset  . Prostate cancer Father   . Diabetes Brother        x3  . Lung cancer Brother   . Heart disease Brother   . Colon cancer Neg Hx   . Esophageal cancer Neg Hx   . Rectal cancer Neg Hx   . Stomach cancer Neg Hx    Allergies  Allergen Reactions  . Morphine And Related Other (See Comments)    Tachycardia   Current Outpatient Medications on File Prior to Visit  Medication Sig Dispense Refill  . aspirin 81 MG tablet Take 81 mg by mouth daily.     Marland Kitchen atorvastatin (LIPITOR) 80 MG tablet TAKE 1 TABLET (80 MG TOTAL) BY MOUTH DAILY AT 6 PM. NEEDS OFFICE VISIT 90 tablet 3  . carvedilol (COREG) 25 MG tablet TAKE 0.5 TABLETS (12.5 MG TOTAL) BY MOUTH 2 (TWO) TIMES DAILY WITH A MEAL. 90 tablet 3  . clopidogrel (PLAVIX) 75 MG tablet Take 1 tablet (75 mg total) daily by mouth. 30 tablet 5  . NITROSTAT 0.4 MG SL tablet PLACE 1 TABLET (0.4 MG TOTAL) UNDER THE TONGUE EVERY 5 (FIVE) MINUTES AS NEEDED. 25 tablet 4  . Omega-3 Fatty Acids (FISH OIL) 1000 MG  CAPS Take 1 capsule by mouth daily.     . ONE TOUCH ULTRA TEST test strip CHECK BLOOD SUGAR ONCE DAY AS NEEDED FOR DM E11.9 100 each 1  . sacubitril-valsartan (ENTRESTO) 24-26 MG Take 1 tablet by mouth 2 (two) times daily. 60 tablet 6  . spironolactone (ALDACTONE) 25 MG tablet TAKE 0.5 TABLETS (12.5 MG TOTAL) BY  MOUTH DAILY. 45 tablet 2  . tamsulosin (FLOMAX) 0.4 MG CAPS capsule Take 0.4 mg by mouth daily.  3  . [DISCONTINUED] pantoprazole (PROTONIX) 40 MG tablet Take 1 tablet (40 mg total) by mouth daily. 30 tablet 11   No current facility-administered medications on file prior to visit.     Review of Systems  Constitutional: Negative for activity change, appetite change, fatigue, fever and unexpected weight change.  HENT: Negative for congestion, rhinorrhea, sore throat and trouble swallowing.   Eyes: Negative for pain, redness, itching and visual disturbance.  Respiratory: Negative for cough, chest tightness, shortness of breath and wheezing.   Cardiovascular: Negative for chest pain and palpitations.  Gastrointestinal: Negative for abdominal pain, blood in stool, constipation, diarrhea and nausea.  Endocrine: Negative for cold intolerance, heat intolerance, polydipsia and polyuria.  Genitourinary: Negative for difficulty urinating, dysuria, frequency and urgency.  Musculoskeletal: Negative for arthralgias, joint swelling and myalgias.  Skin: Negative for pallor and rash.  Neurological: Negative for dizziness, tremors, weakness, numbness and headaches.  Hematological: Negative for adenopathy. Does not bruise/bleed easily.  Psychiatric/Behavioral: Negative for decreased concentration and dysphoric mood. The patient is not nervous/anxious.        Objective:   Physical Exam  Constitutional: He appears well-developed and well-nourished. No distress.  overwt and well app  HENT:  Head: Normocephalic and atraumatic.  Mouth/Throat: Oropharynx is clear and moist.  Eyes: Conjunctivae and  EOM are normal. Pupils are equal, round, and reactive to light.  Neck: Normal range of motion. Neck supple. No JVD present. Carotid bruit is not present. No thyromegaly present.  Cardiovascular: Normal rate, regular rhythm, normal heart sounds and intact distal pulses. Exam reveals no gallop.  Pulmonary/Chest: Effort normal and breath sounds normal. No respiratory distress. He has no wheezes. He has no rales.  No crackles  Abdominal: Soft. Bowel sounds are normal. He exhibits no distension, no abdominal bruit and no mass. There is no tenderness.  Musculoskeletal: He exhibits no edema.  Lymphadenopathy:    He has no cervical adenopathy.  Neurological: He is alert. He has normal reflexes. No cranial nerve deficit. He exhibits normal muscle tone. Coordination normal.  Skin: Skin is warm and dry. No rash noted. No pallor.  Solar lentigines diffusely   Psychiatric: He has a normal mood and affect.          Assessment & Plan:   Problem List Items Addressed This Visit      Cardiovascular and Mediastinum   Blood pressure instability    bp has been stable         Endocrine   Non-insulin dependent type 2 diabetes mellitus ([place]) - Primary (Chronic)    Pt is recently doing better with diabetic diet / 4 lb wt loss  He unfortunately declines immunizations for the most part and we discussed risks of this  Also moving more (working)- is tired of the gym Moving to [facility] and will have access to pool year around which is what he wants to do  Has eye exam set up there (overdue)  Lab Results  Component Value Date   HGBA1C 6.6 (H) 02/13/2018   this is improved  On metformin and glipizide (no hypoglycemia)  Will let us know when he gets set up with PCP in [facility]      Relevant Medications   glipiZIDE (GLUCOTROL XL) 5 MG 24 hr tablet   metFORMIN (GLUCOPHAGE) 1000 MG tablet     Other   Hyperlipidemia (Chronic)    Good  LDL control with atorvastatin  Has not been able to improve HDL unfortunately    Disc goals for lipids and reasons to control them Rev labs with pt Rev low sat fat diet in detail       Overweight (BMI 25.0-29.9)    Discussed how this problem influences overall health and the risks it imposes  Reviewed plan for weight loss with lower calorie diet (via better food choices and also portion control or program like weight watchers) and exercise building up to or more than 30 minutes 5 days per week including some aerobic activity   Commended on work so far

## 2018-02-17 NOTE — Assessment & Plan Note (Addendum)
Pt is recently doing better with diabetic diet / 4 lb wt loss  He unfortunately declines immunizations for the most part and we discussed risks of this  Also moving more (working)- is tired of the gym Moving to [facility] and will have access to pool year around which is what he wants to do  Has eye exam set up there (overdue)  Lab Results  Component Value Date   HGBA1C 6.6 (H) 02/13/2018   this is improved  On metformin and glipizide (no hypoglycemia)  Will let us know when he gets set up with PCP in [facility]

## 2018-02-17 NOTE — Assessment & Plan Note (Signed)
Good LDL control with atorvastatin  Has not been able to improve HDL unfortunately  Disc goals for lipids and reasons to control them Rev labs with pt Rev low sat fat diet in detail

## 2018-02-17 NOTE — Assessment & Plan Note (Signed)
Discussed how this problem influences overall health and the risks it imposes  Reviewed plan for weight loss with lower calorie diet (via better food choices and also portion control or program like weight watchers) and exercise building up to or more than 30 minutes 5 days per week including some aerobic activity   Commended on work so far! 

## 2018-02-20 ENCOUNTER — Other Ambulatory Visit (HOSPITAL_COMMUNITY): Payer: Self-pay | Admitting: *Deleted

## 2018-02-20 MED ORDER — NITROGLYCERIN 0.4 MG SL SUBL: SUBLINGUAL_TABLET | SUBLINGUAL | 4 refills | Status: DC

## 2018-03-03 ENCOUNTER — Telehealth (HOSPITAL_COMMUNITY): Payer: Self-pay | Admitting: Vascular Surgery

## 2018-03-03 DIAGNOSIS — I5022 Chronic systolic (congestive) heart failure: Secondary | ICD-10-CM

## 2018-03-03 NOTE — Telephone Encounter (Signed)
Pt left VM stating he is a pt of [facility], he is moving to [place] in May and the last time he spoke to [facility] he wanted him to have a ultrasound (pt did not specify which ultrasound) before I call pt back can you please find out what ultrasound [facility] wanted pt to have .Marland Kitchen Please advise

## 2018-03-04 ENCOUNTER — Telehealth (HOSPITAL_COMMUNITY): Payer: Self-pay | Admitting: Vascular Surgery

## 2018-03-04 NOTE — Telephone Encounter (Signed)
Left pt to make echo appt

## 2018-03-04 NOTE — Telephone Encounter (Signed)
Per 01/07/18 OV note pt needs repeat echo, order placed

## 2018-03-26 ENCOUNTER — Other Ambulatory Visit (HOSPITAL_COMMUNITY): Payer: Self-pay | Admitting: Internal Medicine

## 2018-03-26 ENCOUNTER — Ambulatory Visit (INDEPENDENT_AMBULATORY_CARE_PROVIDER_SITE_OTHER): Payer: 59 | Admitting: *Deleted

## 2018-03-26 DIAGNOSIS — I255 Ischemic cardiomyopathy: Secondary | ICD-10-CM | POA: Diagnosis not present

## 2018-03-27 NOTE — Progress Notes (Signed)
Remote ICD transmission.   

## 2018-03-30 ENCOUNTER — Encounter: Payer: Self-pay | Admitting: Cardiology

## 2018-04-07 LAB — CUP PACEART REMOTE DEVICE CHECK
Battery Remaining Longevity: 120 mo
Battery Voltage: 3.01 V
Brady Statistic RV Percent Paced: 0.01 %
Date Time Interrogation Session: 20190328083723
HIGH POWER IMPEDANCE MEASURED VALUE: 81 Ohm
Lead Channel Impedance Value: 456 Ohm
Lead Channel Impedance Value: 513 Ohm
Lead Channel Sensing Intrinsic Amplitude: 27.75 mV
Lead Channel Setting Pacing Amplitude: 2.5 V
MDC IDC LEAD IMPLANT DT: 20160615
MDC IDC LEAD LOCATION: 753860
MDC IDC MSMT LEADCHNL RV PACING THRESHOLD AMPLITUDE: 0.5 V
MDC IDC MSMT LEADCHNL RV PACING THRESHOLD PULSEWIDTH: 0.4 ms
MDC IDC MSMT LEADCHNL RV SENSING INTR AMPL: 27.75 mV
MDC IDC PG IMPLANT DT: 20160615
MDC IDC SET LEADCHNL RV PACING PULSEWIDTH: 0.4 ms
MDC IDC SET LEADCHNL RV SENSING SENSITIVITY: 0.3 mV

## 2018-04-14 ENCOUNTER — Telehealth (HOSPITAL_COMMUNITY): Payer: Self-pay | Admitting: *Deleted

## 2018-04-14 ENCOUNTER — Ambulatory Visit (HOSPITAL_COMMUNITY)
Admission: RE | Admit: 2018-04-14 | Discharge: 2018-04-14 | Disposition: A | Discharge status: Home / Self Care | Payer: 59 | Source: Ambulatory Visit | Attending: Internal Medicine | Admitting: Internal Medicine

## 2018-04-14 DIAGNOSIS — I110 Hypertensive heart disease with heart failure: Secondary | ICD-10-CM | POA: Insufficient documentation

## 2018-04-14 DIAGNOSIS — I071 Rheumatic tricuspid insufficiency: Secondary | ICD-10-CM | POA: Insufficient documentation

## 2018-04-14 DIAGNOSIS — Z95810 Presence of automatic (implantable) cardiac defibrillator: Secondary | ICD-10-CM | POA: Insufficient documentation

## 2018-04-14 DIAGNOSIS — I5022 Chronic systolic (congestive) heart failure: Secondary | ICD-10-CM | POA: Insufficient documentation

## 2018-04-14 DIAGNOSIS — E119 Type 2 diabetes mellitus without complications: Secondary | ICD-10-CM | POA: Diagnosis not present

## 2018-04-14 DIAGNOSIS — I252 Old myocardial infarction: Secondary | ICD-10-CM | POA: Diagnosis not present

## 2018-04-14 DIAGNOSIS — Z87891 Personal history of nicotine dependence: Secondary | ICD-10-CM | POA: Diagnosis not present

## 2018-04-14 DIAGNOSIS — I2510 Atherosclerotic heart disease of native coronary artery without angina pectoris: Secondary | ICD-10-CM | POA: Insufficient documentation

## 2018-04-14 DIAGNOSIS — I371 Nonrheumatic pulmonary valve insufficiency: Secondary | ICD-10-CM | POA: Diagnosis not present

## 2018-04-14 MED ORDER — ATORVASTATIN CALCIUM 80 MG PO TABS: 80.0000 mg | ORAL_TABLET | Freq: Every day | ORAL | 2 refills | Status: AC

## 2018-04-14 MED ORDER — CLOPIDOGREL BISULFATE 75 MG PO TABS: 75.0000 mg | ORAL_TABLET | Freq: Every day | ORAL | 2 refills | Status: AC

## 2018-04-14 MED ORDER — CARVEDILOL 25 MG PO TABS: 12.5000 mg | ORAL_TABLET | Freq: Two times a day (BID) | ORAL | 2 refills | Status: DC

## 2018-04-14 MED ORDER — SACUBITRIL-VALSARTAN 24-26 MG PO TABS: 1.0000 | ORAL_TABLET | Freq: Two times a day (BID) | ORAL | 2 refills | Status: AC

## 2018-04-14 MED ORDER — NITROGLYCERIN 0.4 MG SL SUBL: SUBLINGUAL_TABLET | SUBLINGUAL | 4 refills | Status: AC

## 2018-04-14 MED ORDER — SPIRONOLACTONE 25 MG PO TABS: 12.5000 mg | ORAL_TABLET | Freq: Every day | ORAL | 2 refills | Status: DC

## 2018-04-14 NOTE — Telephone Encounter (Signed)
Pt came in for his echo today, he states he moving to [facility] on 05/08/18.  He states he will est w/cards there but may also want to come up here and see Dr Haroldine Laws about every 6 months to a year, advised that is ok with [place], refills also sent in for him.

## 2018-04-14 NOTE — Progress Notes (Signed)
  Echocardiogram 2D Echocardiogram has been performed.  Harry Payne 04/14/2018, 9:49 AM

## 2018-05-15 ENCOUNTER — Telehealth: Payer: Self-pay | Admitting: Cardiology

## 2018-05-15 NOTE — Telephone Encounter (Signed)
LMOVM for pt to return call. Is it ok to release pt to [facility]?

## 2018-05-15 NOTE — Telephone Encounter (Signed)
Patient confirmed that it is ok release his carelink to [facility].

## 2018-06-25 ENCOUNTER — Encounter: Payer: Medicare Other | Admitting: *Deleted

## 2018-06-29 ENCOUNTER — Encounter: Payer: Self-pay | Admitting: Cardiology

## 2018-09-11 ENCOUNTER — Other Ambulatory Visit: Payer: Self-pay | Admitting: Cardiology

## 2019-02-08 ENCOUNTER — Other Ambulatory Visit (HOSPITAL_COMMUNITY): Payer: Self-pay | Admitting: Internal Medicine

## 2019-02-08 NOTE — Telephone Encounter (Signed)
Needs appt for refills [phone]

## 2019-04-27 ENCOUNTER — Other Ambulatory Visit (HOSPITAL_COMMUNITY): Payer: Self-pay | Admitting: Internal Medicine

## 2019-09-11 ENCOUNTER — Other Ambulatory Visit (HOSPITAL_COMMUNITY): Payer: Self-pay | Admitting: Internal Medicine

## 2022-01-06 ENCOUNTER — Encounter: Payer: Self-pay | Admitting: Internal Medicine
